# Patient Record
Sex: Female | Born: 1940 | Race: White | Hispanic: No | Smoking: Never smoker
Health system: Southern US, Community
[De-identification: ages and names within clinical notes are randomized; demographics above are authoritative.]

## PROBLEM LIST (undated history)

## (undated) DIAGNOSIS — M1990 Unspecified osteoarthritis, unspecified site: Secondary | ICD-10-CM

## (undated) DIAGNOSIS — I639 Cerebral infarction, unspecified: Secondary | ICD-10-CM

## (undated) DIAGNOSIS — E785 Hyperlipidemia, unspecified: Secondary | ICD-10-CM

## (undated) DIAGNOSIS — I517 Cardiomegaly: Secondary | ICD-10-CM

## (undated) DIAGNOSIS — I10 Essential (primary) hypertension: Secondary | ICD-10-CM

## (undated) HISTORY — PX: REPLACEMENT TOTAL KNEE BILATERAL: SUR1225

## (undated) HISTORY — DX: Cardiomegaly: I51.7

## (undated) HISTORY — DX: Hyperlipidemia, unspecified: E78.5

## (undated) HISTORY — DX: Unspecified osteoarthritis, unspecified site: M19.90

## (undated) HISTORY — DX: Essential (primary) hypertension: I10

## (undated) HISTORY — DX: Cerebral infarction, unspecified: I63.9

---

## 2005-11-11 ENCOUNTER — Ambulatory Visit: Payer: Self-pay

## 2009-06-23 ENCOUNTER — Inpatient Hospital Stay: Payer: Self-pay | Admitting: Internal Medicine

## 2009-07-04 DIAGNOSIS — T783XXA Angioneurotic edema, initial encounter: Secondary | ICD-10-CM | POA: Insufficient documentation

## 2010-01-23 ENCOUNTER — Emergency Department: Payer: Self-pay

## 2010-06-30 ENCOUNTER — Ambulatory Visit: Payer: Self-pay | Admitting: "Endocrinology

## 2010-07-30 DIAGNOSIS — M1710 Unilateral primary osteoarthritis, unspecified knee: Secondary | ICD-10-CM | POA: Insufficient documentation

## 2013-04-14 ENCOUNTER — Emergency Department: Payer: Self-pay | Admitting: Emergency Medicine

## 2013-07-06 LAB — CBC
HCT: 40.9 % (ref 35.0–47.0)
HGB: 13.4 g/dL (ref 12.0–16.0)
MCH: 30.8 pg (ref 26.0–34.0)
MCHC: 32.7 g/dL (ref 32.0–36.0)
MCV: 94 fL (ref 80–100)
Platelet: 347 10*3/uL (ref 150–440)
RBC: 4.33 10*6/uL (ref 3.80–5.20)
RDW: 13.3 % (ref 11.5–14.5)
WBC: 9.7 10*3/uL (ref 3.6–11.0)

## 2013-07-06 LAB — COMPREHENSIVE METABOLIC PANEL
ALBUMIN: 4 g/dL (ref 3.4–5.0)
Alkaline Phosphatase: 116 U/L
Anion Gap: 12 (ref 7–16)
BUN: 21 mg/dL — ABNORMAL HIGH (ref 7–18)
Bilirubin,Total: 0.4 mg/dL (ref 0.2–1.0)
CO2: 26 mmol/L (ref 21–32)
Calcium, Total: 9.7 mg/dL (ref 8.5–10.1)
Chloride: 100 mmol/L (ref 98–107)
Creatinine: 0.7 mg/dL (ref 0.60–1.30)
EGFR (African American): >60
GLUCOSE: 128 mg/dL — AB (ref 65–99)
Osmolality: 280 (ref 275–301)
POTASSIUM: 3.2 mmol/L — AB (ref 3.5–5.1)
SGOT(AST): 26 U/L (ref 15–37)
SGPT (ALT): 29 U/L (ref 12–78)
SODIUM: 138 mmol/L (ref 136–145)
TOTAL PROTEIN: 7.6 g/dL (ref 6.4–8.2)

## 2013-07-06 LAB — TROPONIN I

## 2013-07-06 LAB — LIPASE, BLOOD: Lipase: 201 U/L (ref 73–393)

## 2013-07-06 LAB — ETHANOL
Ethanol %: <0.003 % (ref 0.000–0.080)
Ethanol: <3 mg/dL

## 2013-07-06 LAB — ACETAMINOPHEN LEVEL: Acetaminophen: <2 ug/mL

## 2013-07-06 LAB — SALICYLATE LEVEL: Salicylates, Serum: 2.3 mg/dL

## 2013-07-07 ENCOUNTER — Observation Stay: Payer: Self-pay | Admitting: Internal Medicine

## 2013-07-07 LAB — URINALYSIS, COMPLETE
BACTERIA: NONE SEEN
Bilirubin,UR: NEGATIVE
Blood: NEGATIVE
Glucose,UR: NEGATIVE mg/dL (ref 0–75)
Ketone: NEGATIVE
Nitrite: NEGATIVE
Ph: 6 (ref 4.5–8.0)
Protein: NEGATIVE
RBC,UR: 1 /HPF (ref 0–5)
Specific Gravity: 1.015 (ref 1.003–1.030)
WBC UR: 1 /HPF (ref 0–5)

## 2013-07-07 LAB — POTASSIUM: Potassium: 3.5 mmol/L (ref 3.5–5.1)

## 2014-06-09 NOTE — H&P (Signed)
PATIENT NAME:  Tiffany Barber, SIMMERING MR#:  [id number] DATE OF BIRTH:  Dec 25, 1940  DATE OF ADMISSION:  07/07/2013  REFERRING PHYSICIAN:  Dr. Minna Antis.  PRIMARY CARE PHYSICIAN:  [facility].   CHIEF COMPLAINT:  Accidental overdose.   HISTORY OF PRESENT ILLNESS:  A 74 year old female with history of hypertension, rheumatoid arthritis, presents after an accidental tramadol overdose.  The patient has mild confusion at baseline.  Her daughter helps her with activities of daily living including getting her medications ready at nighttime.  Her daughter was organizing her and her mother's pills, left the room and the patient accidentally took all of her daughter's tramadol (tramadol 50 mg x 40 tablets), called EMS.  On their arrival noted to be initially hypotensive, blood pressure 80s/40s as well as tachycardic.  Currently the patient is without complaints.   REVIEW OF SYSTEMS:  CONSTITUTIONAL:  Denies fevers, chills, fatigue.  EYES:  Denies blurred vision, double vision, eye pain.  EARS, NOSE, THROAT:  Denies tinnitus, ear pain, hearing loss.  RESPIRATORY:  Denies cough, wheeze, shortness of breath.  CARDIOVASCULAR:  Denies chest pain, palpitations, edema.  GASTROINTESTINAL:  Denies nausea, vomiting, diarrhea, abdominal pain.  GENITOURINARY:  Denies dysuria, hematuria.  ENDOCRINE:  Denies nocturia or thyroid problems. HEMATOLOGY AND LYMPHATIC:  Denies easy bruising, bleeding.  SKIN:  Denies rash or lesion.  MUSCULOSKELETAL:  Denies pain in neck, back, shoulder, knees, hips or arthritic symptoms.  NEUROLOGIC:  Denies paralysis, paresthesias.  PSYCHIATRIC:  Denies anxiety or depressive symptoms.  Otherwise, full review of systems performed by me is negative.   PAST MEDICAL HISTORY:  Hypertension as well as rheumatoid arthritis and osteoarthritis.   SOCIAL HISTORY:  Denies any alcohol, tobacco or drug usage.  She uses a cane for ambulation.   FAMILY HISTORY:  Positive for COPD as well as  congestive heart failure.   ALLERGIES:  LISINOPRIL   HOME MEDICATIONS:  Include aspirin 325 mg by mouth daily, clonidine 0.1 mg by mouth twice daily, Norvasc 10 mg by mouth daily, hydrochlorothiazide 10 mg by mouth daily.   PHYSICAL EXAMINATION: VITAL SIGNS:  Temperature 97.4, heart rate 88, respirations 26, currently 20, blood pressure 95/44, currently 137/58, saturating 99% on room air.  Weight 82.1 kg.  GENERAL:  Well-nourished, well-developed, African American female, currently somnolent, but in no acute distress.  HEAD:  Normocephalic, atraumatic.  EYES:  Pupils equal, round, and reactive to light and sluggish.  Extraocular muscles intact.  No scleral icterus.   MOUTH:  Moist mucous membranes.  Dentition intact.  No abscess noted.  EARS, NOSE, THROAT:  Throat clear without exudates.  No external lesions.  NECK:  Supple.  No thyromegaly.  No nodules.  No JVD.  PULMONARY:  Clear to auscultation bilaterally without wheezes, rubs, rhonchi.  No use of accessory muscles.  Good respiratory effort.  CHEST:  Nontender to palpation.  CARDIOVASCULAR:  S1, S2, regular rate and rhythm.  No murmurs, rubs, or gallops, 1+ trace to 1+ edema bilaterally to the shins.  Pedal pulses 2+ bilaterally.  GASTROINTESTINAL:  Soft, nontender, nondistended.  No masses.  Positive bowel sounds.  No hepatosplenomegaly.  MUSCULOSKELETAL:  No swelling, clubbing, or edema, other than described above.  Range of motion full in all extremities.  NEUROLOGIC:  Cranial nerves II through XII intact.  No gross focal neurological deficits.  Sensation intact.  Reflexes intact.  SKIN:  No ulcerations, lesions, rash, or cyanosis.  Skin warm and dry.  Turgor intact.  PSYCHIATRIC:  Mood and affect within  normal limits.  The patient is drowsy, though easily awakens to verbal stimuli and is fully conversant at that time.  Insight and judgment poor.   LABORATORY DATA:  EKG revealing right bundle branch block with QRS measuring 170, which  is essentially unchanged from prior examination.  Sodium 130, potassium 3.2, chloride 100, bicarb 26, BUN 21, creatinine 0.7, glucose 128.  LFTs within normal limits.  Troponin less than 0.02.  Ethanol less than 0.003.  WBC 9.7, hemoglobin 13.4, platelets of 347.  Salicylates 2.3.  Acetaminophen less than 2.   ASSESSMENT AND PLAN:  A 74 year old female with history of hypertension presenting with accidental tramadol overdose. 1.  Tramadol overdose, which was accidental.  The case discussed with Poison Control in the Emergency Department.  She will be placed under observation status on telemetry.  We will also place continuous pulse oximetry.  Continue to provide supportive care.  2.  Hypertension, relatively hypotensive on arrival, however now improved.  I will still hold hydrochlorothiazide and Norvasc.  Restart her other medications and restart these as her blood pressure tolerates.  3.  Hypokalemia.  Replace to goal potassium of 3 to 4. 4.  Venous thromboembolism prophylaxis with heparin subQ. 5.  CODE STATUS:  THE PATIENT IS A FULL CODE.  TIME SPENT:  45 minutes.    ____________________________ Cletis Athensavid K. Hower, MD dkh:ea D: 07/07/2013 00:18:20 ET T: 07/07/2013 00:58:54 ET JOB#: [id number]  cc: Cletis Athensavid K. Hower, MD, <Dictator> DAVID Synetta ShadowK HOWER MD ELECTRONICALLY SIGNED 07/07/2013 3:39

## 2014-06-09 NOTE — Discharge Summary (Signed)
PATIENT NAME:  Tiffany Barber, Tiffany Barber  DATE OF ADMISSION:  07/07/2013 DATE OF DISCHARGE:  07/07/2013  DISCHARGE DIAGNOSES:  1. Accidental tramadol overdose, now doing fine. Half-life of tramadol is about 7-9 hours and she is mentating and symptomatically also within normal limits.  2. Hypertension with relative hypotension on admission, now resolved and blood pressure is high again but under better control. 3. Hypokalemia, repleted and resolved.   SECONDARY DIAGNOSES:  1. Hypertension.  2. Rheumatoid arthritis.  3. Osteoarthritis.   CONSULTATIONS: None.   PROCEDURES AND RADIOLOGY: None.  MAJOR LABORATORY PANEL: UA on admission was negative.   Serum salicylate level 2.3.  Serum Tylenol level less than 2.   HISTORY AND SHORT HOSPITAL COURSE: The patient is a 74 year old female with above-mentioned medical problems who was admitted for tramadol overdose and she was admitted under observation as per poison control recommendation. Please see Dr. Deatra Inaavid Hower's history and physical for further details. The patient was feeling much better the 22nd of May and was discharged home in stable condition.   VITAL SIGNS ON THE DATE OF DISCHARGE:  Are as follows: Temperature 97.1, heart rate 87 per minute, respirations 16 per minute, blood pressure 155/77 mmHg.  She was saturating 94% on room air.  PHYSICAL EXAMINATION: On the date of discharge:  CARDIOVASCULAR:  S1, S2 normal.  No murmurs, rubs, or gallops. LUNGS:  Clear to auscultation bilaterally. No wheezing, rales, rhonchi, or crepitation.  ABDOMEN: Soft, benign.  NEUROLOGIC: Nonfocal examination.   All other physical examination remained at baseline.   DISCHARGE MEDICATIONS:  1. HCTZ 25 mg p.o. daily.  2. Norvasc 10 mg p.o. daily. 3. Clonidine 1 mg p.o. b.i.d.  4. Aspirin 325 mg p.o. at bedtime. She was recommended to hold aspirin over the weekend and start it on Monday, May 25th. She was also instructed  to avoid any kind of nonsteroidal anti-inflammatory medication.   DISCHARGE DIET:  Low sodium.   DISCHARGE ACTIVITY: As tolerated.   DISCHARGE INSTRUCTIONS AND FOLLOWUP: The patient was instructed to follow up with her primary care physician, Tiffany Barber, in 1-2 weeks.   TIME SPENT ON THIS ADMISSION: 40 minutes.    ____________________________ Ellamae SiaVipul S. Sherryll BurgerShah, MD vss:dd D: 07/09/2013 16:31:19 ET T: 07/10/2013 02:38:19 ET JOB#: [id number]  cc: Eddith Mentor S. Sherryll BurgerShah, MD, <Dictator> [address]Tiffany Murfin, PA-C  Ellamae SiaVIPUL S [facility] MD ELECTRONICALLY SIGNED 07/11/2013 11:47

## 2014-07-05 ENCOUNTER — Ambulatory Visit: Payer: Self-pay | Admitting: Ophthalmology

## 2014-08-23 ENCOUNTER — Ambulatory Visit: Payer: Self-pay | Admitting: Ophthalmology

## 2014-10-25 ENCOUNTER — Ambulatory Visit: Payer: Self-pay | Admitting: Ophthalmology

## 2014-11-15 ENCOUNTER — Other Ambulatory Visit: Payer: Self-pay

## 2014-11-15 LAB — CBC AND DIFFERENTIAL
HEMATOCRIT: 38 % (ref 36–46)
Hemoglobin: 12.6 g/dL (ref 12.0–16.0)
NEUTROS ABS: 4 /uL
Platelets: 363 10*3/uL (ref 150–399)
WBC: 6.1 10*3/mL

## 2014-11-15 LAB — LIPID PANEL
Cholesterol: 193 mg/dL (ref 0–200)
HDL: 59 mg/dL (ref 35–70)
LDL CALC: 114 mg/dL
Triglycerides: 99 mg/dL (ref 40–160)

## 2014-11-15 LAB — BASIC METABOLIC PANEL
BUN: 17 mg/dL (ref 4–21)
Creatinine: 0.5 mg/dL (ref 0.5–1.1)
GLUCOSE: 104 mg/dL
Potassium: 4 mmol/L (ref 3.4–5.3)
SODIUM: 139 mmol/L (ref 137–147)

## 2014-11-15 LAB — HEPATIC FUNCTION PANEL
ALT: 22 U/L (ref 7–35)
AST: 22 U/L (ref 13–35)
Alkaline Phosphatase: 101 U/L (ref 25–125)
Bilirubin, Total: 0.2 mg/dL

## 2014-11-21 ENCOUNTER — Ambulatory Visit: Payer: Self-pay | Admitting: Internal Medicine

## 2014-11-21 DIAGNOSIS — M1990 Unspecified osteoarthritis, unspecified site: Secondary | ICD-10-CM | POA: Insufficient documentation

## 2014-11-22 ENCOUNTER — Ambulatory Visit: Payer: Self-pay

## 2014-11-28 ENCOUNTER — Ambulatory Visit: Payer: Self-pay | Admitting: Internal Medicine

## 2014-12-01 DIAGNOSIS — I10 Essential (primary) hypertension: Secondary | ICD-10-CM | POA: Insufficient documentation

## 2015-04-25 DIAGNOSIS — I10 Essential (primary) hypertension: Secondary | ICD-10-CM

## 2015-04-25 DIAGNOSIS — M1712 Unilateral primary osteoarthritis, left knee: Secondary | ICD-10-CM

## 2015-05-30 ENCOUNTER — Ambulatory Visit: Payer: Self-pay | Admitting: Ophthalmology

## 2015-09-02 DIAGNOSIS — R9431 Abnormal electrocardiogram [ECG] [EKG]: Secondary | ICD-10-CM | POA: Insufficient documentation

## 2015-09-02 DIAGNOSIS — I4510 Unspecified right bundle-branch block: Secondary | ICD-10-CM

## 2015-09-02 DIAGNOSIS — R0609 Other forms of dyspnea: Secondary | ICD-10-CM | POA: Insufficient documentation

## 2015-09-02 DIAGNOSIS — Z8673 Personal history of transient ischemic attack (TIA), and cerebral infarction without residual deficits: Secondary | ICD-10-CM | POA: Insufficient documentation

## 2015-11-20 DIAGNOSIS — E785 Hyperlipidemia, unspecified: Secondary | ICD-10-CM | POA: Insufficient documentation

## 2017-02-16 HISTORY — PX: JOINT REPLACEMENT: SHX530

## 2017-09-25 ENCOUNTER — Emergency Department
Admission: EM | Admit: 2017-09-25 | Discharge: 2017-09-25 | Disposition: A | Discharge status: Home / Self Care | Payer: Medicare Other | Attending: Emergency Medicine | Admitting: Emergency Medicine

## 2017-09-25 ENCOUNTER — Emergency Department: Payer: Medicare Other

## 2017-09-25 ENCOUNTER — Other Ambulatory Visit: Payer: Self-pay

## 2017-09-25 DIAGNOSIS — I10 Essential (primary) hypertension: Secondary | ICD-10-CM | POA: Diagnosis not present

## 2017-09-25 DIAGNOSIS — R002 Palpitations: Secondary | ICD-10-CM | POA: Diagnosis not present

## 2017-09-25 DIAGNOSIS — Z79899 Other long term (current) drug therapy: Secondary | ICD-10-CM | POA: Insufficient documentation

## 2017-09-25 LAB — BASIC METABOLIC PANEL
Anion gap: 8 (ref 5–15)
BUN: 18 mg/dL (ref 8–23)
CHLORIDE: 99 mmol/L (ref 98–111)
CO2: 30 mmol/L (ref 22–32)
CREATININE: 0.65 mg/dL (ref 0.44–1.00)
Calcium: 9.3 mg/dL (ref 8.9–10.3)
GFR calc Af Amer: >60 mL/min (ref >60)
GFR calc non Af Amer: >60 mL/min (ref >60)
GLUCOSE: 137 mg/dL — AB (ref 70–99)
POTASSIUM: 3.7 mmol/L (ref 3.5–5.1)
SODIUM: 137 mmol/L (ref 135–145)

## 2017-09-25 LAB — TROPONIN I
Troponin I: <0.03 ng/mL (ref <0.03)
Troponin I: <0.03 ng/mL (ref <0.03)

## 2017-09-25 LAB — CBC
HEMATOCRIT: 36.7 % (ref 35.0–47.0)
Hemoglobin: 12.6 g/dL (ref 12.0–16.0)
MCH: 32.9 pg (ref 26.0–34.0)
MCHC: 34.3 g/dL (ref 32.0–36.0)
MCV: 96 fL (ref 80.0–100.0)
PLATELETS: 363 10*3/uL (ref 150–440)
RBC: 3.82 MIL/uL (ref 3.80–5.20)
RDW: 13.7 % (ref 11.5–14.5)
WBC: 7.8 10*3/uL (ref 3.6–11.0)

## 2017-09-25 NOTE — Discharge Instructions (Addendum)
Your EKG, chest xray, and labs today were okay. Please follow up with your cardiologist for further evaluation of your symptoms.

## 2017-09-25 NOTE — ED Notes (Addendum)
Pts diaper changed and pt repositioned in bed. Pt requested food. Pt offered what food/drink we have in ED. Pt denied wanting anything offered.

## 2017-09-25 NOTE — ED Triage Notes (Signed)
Pt arrives via ems from home, states that this am when she sat up on the side of the bed she felt her heart racing and it made her feel dizzy. Wide complexes noted to EMS' monitor strip. fsbs of 148 for ems as well as vitals of 148/68, 100 pulse rate, 97% RA. Pt denies sob or pain at this time

## 2017-09-25 NOTE — ED Provider Notes (Signed)
[facility] Emergency Department Provider Note  ____________________________________________  Time seen: Approximately 3:07 PM  I have reviewed the triage vital signs and the nursing notes.   HISTORY  Chief Complaint Irregular Heart Beat    HPI Tiffany Barber is a 77 y.o. female with a history of hypertension who complains of a brief moment of palpitations that occurred while she was turning over in bed this morning at about 9:30 AM.  Lasted just a few seconds, denies chest pain or radiation.  No aggravating or alleviating factors.  Resolved spontaneously without recurrence over the past few hours.  She previously had a nuclear medicine cardiac study in 2017 for cardiac clearance in advance of planned bilateral knee replacement.  She had one year placement at that time.  She is planned for the other need to be replaced by orthopedics over the next few months.  She reports that she went to another cardiologist through [facility] and had repeat cardiac screening that was unremarkable.  She does not have that result available.  Is not available in the EMR.      Past Medical History:  Diagnosis Date  . 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 Hypertension      Patient Active Problem List   Diagnosis Date Noted  . Hypertension 12/01/2014  . Degenerative arthritis 11/21/2014        Prior to Admission medications   Medication Sig Start Date End Date Taking? Authorizing Provider  amLODipine (NORVASC) 10 MG tablet Take 10 mg by mouth daily.   Yes [provider]  aspirin 325 MG tablet Take 325 mg by mouth daily.   Yes [provider]  cloNIDine (CATAPRES) 0.1 MG tablet Take 0.1 mg by mouth 2 (two) times daily. 05/23/14  Yes [provider]  fluticasone (FLONASE) 50 MCG/ACT nasal spray Place 2 sprays into both nostrils daily. 06/29/17  Yes [provider]  hydrochlorothiazide (HYDRODIURIL) 25 MG tablet Take 25  mg by mouth daily. 05/23/14  Yes [provider]  lovastatin (MEVACOR) 20 MG tablet Take 1 tablet by mouth daily. 06/29/17  Yes [provider]  meloxicam (MOBIC) 15 MG tablet Take 15 mg by mouth daily. 05/23/14  Yes [provider]  traMADol (ULTRAM) 50 MG tablet Take 1 tablet by mouth at bedtime.  07/14/17  Yes [provider]  TRAVATAN Z 0.004 % SOLN ophthalmic solution Place 1 drop into both ears daily. 06/29/17  Yes [provider]     Allergies Lisinopril and Nifedipine   Family History  Problem Relation Age of Onset  . Hypertension Mother   . Diabetes Daughter   . Hyperlipidemia Daughter     Social History Social History   Tobacco Use  . Smoking status: Never Smoker  Substance Use Topics  . Alcohol use: No  . Drug use: No    Review of Systems  Constitutional:   No fever or chills.  Cardiovascular:   No chest pain or syncope.  Positive palpitations Respiratory:   No dyspnea or cough. Gastrointestinal:   Negative for abdominal pain, vomiting and diarrhea.  Musculoskeletal:   Negative for focal pain or swelling All other systems reviewed and are negative except as documented above in ROS and HPI.  ____________________________________________   PHYSICAL EXAM:  VITAL SIGNS: ED Triage Vitals [09/25/17 1108]  Enc Vitals Group     BP 132/69     Pulse Rate 92     Resp 18  Temp 97.8 F (36.6 C)     Temp Source Oral     SpO2 97 %     Weight 195 lb (88.5 kg)     Height 5' (1.524 m)     Head Circumference      Peak Flow      Pain Score 0     Pain Loc      Pain Edu?      Excl. in GC?     Vital signs reviewed, nursing assessments reviewed.   Constitutional:   Alert and oriented. Non-toxic appearance. Eyes:   Conjunctivae are normal. EOMI. PERRL. ENT      Head:   Normocephalic and atraumatic.      Nose:   No congestion/rhinnorhea.       Mouth/Throat:   MMM, no pharyngeal erythema. No peritonsillar mass.        Neck:   No meningismus. Full ROM. Hematological/Lymphatic/Immunilogical:   No cervical lymphadenopathy. Cardiovascular:   RRR. Symmetric bilateral radial and DP pulses.  No murmurs. Cap refill less than 2 seconds. Respiratory:   Normal respiratory effort without tachypnea/retractions. Breath sounds are clear and equal bilaterally. No wheezes/rales/rhonchi. Gastrointestinal:   Soft and nontender. Non distended. There is no CVA tenderness.  No rebound, rigidity, or guarding. Musculoskeletal:   Normal range of motion in all extremities. No joint effusions.  No lower extremity tenderness.  No edema. Neurologic:   Normal speech and language.  Motor grossly intact. No acute focal neurologic deficits are appreciated.  Skin:    Skin is warm, dry and intact. No rash noted.  No petechiae, purpura, or bullae.  ____________________________________________    LABS (pertinent positives/negatives) (all labs ordered are listed, but only abnormal results are displayed) Labs Reviewed  BASIC METABOLIC PANEL - Abnormal; Notable for the following components:      Result Value   Glucose, Bld 137 (*)    All other components within normal limits  CBC  TROPONIN I  TROPONIN I   ____________________________________________   EKG  Interpreted by me Sinus rhythm rate of 91, left axis, right bundle branch block.  LVH.  No acute ischemic changes.  ____________________________________________    RADIOLOGY  Dg Chest 2 View  Result Date: 09/25/2017 CLINICAL DATA:  Dizziness and tachycardia today. EXAM: CHEST - 2 VIEW COMPARISON:  06/23/2009. FINDINGS: The cardiac silhouette remains borderline enlarged. 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 The interstitial markings remain mildly prominent. Diffuse osteopenia. Thoracic spine degenerative changes. IMPRESSION: Stable mild chronic interstitial lung disease and borderline cardiomegaly. No acute abnormality. Electronically Signed   By: Beckie SaltsSteven  Reid  M.D.   On: 09/25/2017 11:44    ____________________________________________   PROCEDURES Procedures  ____________________________________________    CLINICAL IMPRESSION / ASSESSMENT AND PLAN / ED COURSE  Pertinent labs & imaging results that were available during my care of the patient were reviewed by me and considered in my medical decision making (see chart for details).    Patient not in distress, presents with normal vital signs after an episode of palpitations.  Denies chest pain shortness of breath or exertional symptoms.  EKG is unchanged from previous.  Chest x-ray is unremarkable.  Labs are normal including troponin x2.  Symptoms are not concerning for any serious underlying cardiac or pulmonary pathology, refer back to cardiology for further assessment and preop planning.      ____________________________________________   FINAL CLINICAL IMPRESSION(S) / ED DIAGNOSES    Final diagnoses:  Palpitations     ED Discharge Orders  None      Portions of this note were generated with dragon dictation software. Dictation errors may occur despite best attempts at proofreading.    Sharman Cheek, MD 09/25/17 [phone]

## 2017-09-28 ENCOUNTER — Encounter: Payer: Self-pay | Admitting: Emergency Medicine

## 2017-09-28 ENCOUNTER — Emergency Department: Payer: Medicare Other

## 2017-09-28 ENCOUNTER — Emergency Department
Admission: EM | Admit: 2017-09-28 | Discharge: 2017-09-28 | Disposition: A | Discharge status: Home / Self Care | Payer: Medicare Other | Attending: Emergency Medicine | Admitting: Emergency Medicine

## 2017-09-28 ENCOUNTER — Other Ambulatory Visit: Payer: Self-pay

## 2017-09-28 DIAGNOSIS — I10 Essential (primary) hypertension: Secondary | ICD-10-CM | POA: Diagnosis not present

## 2017-09-28 DIAGNOSIS — Z7982 Long term (current) use of aspirin: Secondary | ICD-10-CM | POA: Insufficient documentation

## 2017-09-28 DIAGNOSIS — R42 Dizziness and giddiness: Secondary | ICD-10-CM

## 2017-09-28 DIAGNOSIS — Z79899 Other long term (current) drug therapy: Secondary | ICD-10-CM | POA: Insufficient documentation

## 2017-09-28 LAB — URINALYSIS, COMPLETE (UACMP) WITH MICROSCOPIC
Bacteria, UA: NONE SEEN
Bilirubin Urine: NEGATIVE
GLUCOSE, UA: NEGATIVE mg/dL
Hgb urine dipstick: NEGATIVE
KETONES UR: NEGATIVE mg/dL
Nitrite: NEGATIVE
PH: 7 (ref 5.0–8.0)
Protein, ur: NEGATIVE mg/dL
SPECIFIC GRAVITY, URINE: 1.01 (ref 1.005–1.030)

## 2017-09-28 LAB — BASIC METABOLIC PANEL
Anion gap: 12 (ref 5–15)
BUN: 19 mg/dL (ref 8–23)
CHLORIDE: 98 mmol/L (ref 98–111)
CO2: 29 mmol/L (ref 22–32)
CREATININE: 0.57 mg/dL (ref 0.44–1.00)
Calcium: 9.2 mg/dL (ref 8.9–10.3)
GFR calc Af Amer: >60 mL/min (ref >60)
GLUCOSE: 128 mg/dL — AB (ref 70–99)
Potassium: 3.6 mmol/L (ref 3.5–5.1)
SODIUM: 139 mmol/L (ref 135–145)

## 2017-09-28 LAB — CBC
HCT: 38.4 % (ref 35.0–47.0)
Hemoglobin: 13.5 g/dL (ref 12.0–16.0)
MCH: 34.1 pg — ABNORMAL HIGH (ref 26.0–34.0)
MCHC: 35.2 g/dL (ref 32.0–36.0)
MCV: 97.1 fL (ref 80.0–100.0)
PLATELETS: 387 10*3/uL (ref 150–440)
RBC: 3.96 MIL/uL (ref 3.80–5.20)
RDW: 13.6 % (ref 11.5–14.5)
WBC: 10.3 10*3/uL (ref 3.6–11.0)

## 2017-09-28 LAB — TROPONIN I: Troponin I: <0.03 ng/mL (ref <0.03)

## 2017-09-28 MED ORDER — MECLIZINE HCL 25 MG PO TABS
25.0000 mg | ORAL_TABLET | Freq: Once | ORAL | Status: AC
  Administered 2017-09-28: 25 mg via ORAL
  Filled 2017-09-28: qty 1

## 2017-09-28 MED ORDER — SODIUM CHLORIDE 0.9 % IV BOLUS
500.0000 mL | Freq: Once | INTRAVENOUS | Status: AC
  Administered 2017-09-28: 500 mL via INTRAVENOUS

## 2017-09-28 MED ORDER — MECLIZINE HCL 25 MG PO TABS: 25.0000 mg | ORAL_TABLET | Freq: Three times a day (TID) | ORAL | 0 refills | Status: DC | PRN

## 2017-09-28 NOTE — ED Provider Notes (Signed)
[facility] Emergency Department Provider Note   ____________________________________________   First MD Initiated Contact with Patient 09/28/17 0407     (approximate)  I have reviewed the triage vital signs and the nursing notes.   HISTORY  Chief Complaint Dizziness    HPI Tiffany Barber is a 77 y.o. female who comes into the hospital today with some dizziness.  The patient was woken up from sleep feeling dizzy.  The patient has no chest pain or shortness of breath.  She was here recently with similar symptoms.  The patient went to bed earlier today and then woke up at 230 with the symptoms.  She felt like everything was spinning around.  The patient denies any nausea or vomiting.  She states that she is had a stroke in the past and is on blood thinners.  Her head just feels funny.  She denies any headache or blurred vision.  The patient states that she was here for vertigo recently but they did not give her any medicines to go home with.  She is here for evaluation.  Past Medical History:  Diagnosis Date  . 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 Hypertension     Patient Active Problem List   Diagnosis Date Noted  . Hypertension 12/01/2014  . Degenerative arthritis 11/21/2014    History reviewed. No pertinent surgical history.  Prior to Admission medications   Medication Sig Start Date End Date Taking? Authorizing Provider  amLODipine (NORVASC) 10 MG tablet Take 10 mg by mouth daily.    [provider]  aspirin 325 MG tablet Take 325 mg by mouth daily.    [provider]  cloNIDine (CATAPRES) 0.1 MG tablet Take 0.1 mg by mouth 2 (two) times daily. 05/23/14   [provider]  fluticasone (FLONASE) 50 MCG/ACT nasal spray Place 2 sprays into both nostrils daily. 06/29/17   [provider]  hydrochlorothiazide (HYDRODIURIL) 25 MG tablet Take 25 mg by mouth daily. 05/23/14   [provider]    lovastatin (MEVACOR) 20 MG tablet Take 1 tablet by mouth daily. 06/29/17   [provider]  meclizine (ANTIVERT) 25 MG tablet Take 1 tablet (25 mg total) by mouth 3 (three) times daily as needed for dizziness. 09/28/17   Rebecka ApleyWebster, Allison P, MD  meloxicam (MOBIC) 15 MG tablet Take 15 mg by mouth daily. 05/23/14   [provider]  traMADol (ULTRAM) 50 MG tablet Take 1 tablet by mouth at bedtime.  07/14/17   [provider]  TRAVATAN Z 0.004 % SOLN ophthalmic solution Place 1 drop into both ears daily. 06/29/17   [provider]    Allergies Lisinopril and Nifedipine  Family History  Problem Relation Age of Onset  . Hypertension Mother   . Diabetes Daughter   . Hyperlipidemia Daughter     Social History Social History   Tobacco Use  . Smoking status: Never Smoker  . Smokeless tobacco: Never Used  Substance Use Topics  . Alcohol use: No  . Drug use: No    Review of Systems  Constitutional: No fever/chills Eyes: No visual changes. ENT: No sore throat. Cardiovascular: Denies chest pain. Respiratory: Denies shortness of breath. Gastrointestinal: No abdominal pain.  No nausea, no vomiting.  No diarrhea.  No constipation. Genitourinary: Negative for dysuria. Musculoskeletal: Negative for back pain. Skin: Negative for rash. Neurological: Dizziness   ____________________________________________   PHYSICAL EXAM:  VITAL SIGNS: ED Triage Vitals  Enc  Vitals Group     BP 09/28/17 0407 (!) 143/71     Pulse Rate 09/28/17 0407 88     Resp 09/28/17 0407 18     Temp 09/28/17 0402 98.3 F (36.8 C)     Temp Source 09/28/17 0402 Oral     SpO2 09/28/17 0407 97 %     Weight 09/28/17 0402 195 lb (88.5 kg)     Height 09/28/17 0402 5' (1.524 m)     Head Circumference --      Peak Flow --      Pain Score 09/28/17 0402 0     Pain Loc --      Pain Edu? --      Excl. in GC? --     Constitutional: Alert and oriented. Well appearing and in moderate  distress. Eyes: Conjunctivae are normal. PERRL. EOMI. Head: Atraumatic. Nose: No congestion/rhinnorhea. Mouth/Throat: Mucous membranes are moist.  Oropharynx non-erythematous. Cardiovascular: Normal rate, regular rhythm. Grossly normal heart sounds.  Good peripheral circulation. Respiratory: Normal respiratory effort.  No retractions. Lungs CTAB. Gastrointestinal: Soft and nontender. No distention.  Positive bowel sounds Musculoskeletal: No lower extremity tenderness nor edema.   Neurologic:  Normal speech and language.  Cranial nerves II through XII are grossly intact with no focal motor neuro deficits Skin:  Skin is warm, dry and intact.  Psychiatric: Mood and affect are normal.   ____________________________________________   LABS (all labs ordered are listed, but only abnormal results are displayed)  Labs Reviewed  BASIC METABOLIC PANEL - Abnormal; Notable for the following components:      Result Value   Glucose, Bld 128 (*)    All other components within normal limits  CBC - Abnormal; Notable for the following components:   MCH 34.1 (*)    All other components within normal limits  URINALYSIS, COMPLETE (UACMP) WITH MICROSCOPIC - Abnormal; Notable for the following components:   Color, Urine YELLOW (*)    APPearance CLEAR (*)    Leukocytes, UA SMALL (*)    All other components within normal limits  TROPONIN I  TROPONIN I  CBG MONITORING, ED   ____________________________________________  EKG  ED ECG REPORT I, Rebecka ApleyWebster,  Allison P, the attending physician, personally viewed and interpreted this ECG.   Date: 09/28/2017  EKG Time: 403  Rate: 87  Rhythm: normal sinus rhythm  Axis: left axis deviation  Intervals:right bundle branch block  ST&T Change: ST segment elevation in lead V3 and lead V4  ____________________________________________  RADIOLOGY  ED MD interpretation:  CT head: No acute intracranial abnormalities, chronic atrophy and small vessel ischemic  changes.  Official radiology report(s): Ct Head Wo Contrast  Result Date: 09/28/2017 CLINICAL DATA:  Awoke with dizziness , hypertension, abnormal EKG rhythm EXAM: CT HEAD WITHOUT CONTRAST TECHNIQUE: Contiguous axial images were obtained from the base of the skull through the vertex without intravenous contrast. COMPARISON:  MRI brain 06/26/2009. CT head 06/23/2009 FINDINGS: Brain: Mild cerebral atrophy. Low-attenuation changes in the deep white matter consistent with small vessel ischemia and old lacunar infarcts. No change since previous study. No mass-effect or midline shift. No abnormal extra-axial fluid collections. Gray-white matter junctions are distinct. Basal cisterns are not effaced. No acute intracranial hemorrhage. Vascular: Intracranial arterial vascular calcifications are present. Skull: Normal. Negative for fracture or focal lesion. Sinuses/Orbits: No acute finding. Other: None. IMPRESSION: No acute intracranial abnormalities. Chronic atrophy and small vessel ischemic changes. Electronically Signed   By: Burman NievesWilliam  Stevens M.D.   On: 09/28/2017 05:18  ____________________________________________   PROCEDURES  Procedure(s) performed: None  Procedures  Critical Care performed: No  ____________________________________________   INITIAL IMPRESSION / ASSESSMENT AND PLAN / ED COURSE  As part of my medical decision making, I reviewed the following data within the electronic MEDICAL RECORD NUMBER Notes from prior ED visits and [place] Controlled Substance Database   This is a 77 year old female who comes into the hospital today with dizziness.  She reports that she woke up and she felt as though the room was spinning.  She did receive an EKG which showed a right bundle branch block and some one box elevation in V3 and V4 which had not been seen previously.  We did check a CBC, BMP and a troponin which were all unremarkable.  The patient also had a CT scan of her head which was unremarkable.   I did give the patient some meclizine and a 500 mm bolus of normal saline.  I will await the urinalysis and we will repeat her troponin and I will reassess the patient. The patient also complained that her ear felt funny but her ears had no abnormality when evaluated.   After the meclizine the patient states that she feels much improved.  We will repeat the patient's troponin and she will be disposition.  The patient's care will be signed out to Dr. Scotty Court who will reassess the patient and determine her disposition.      ____________________________________________   FINAL CLINICAL IMPRESSION(S) / ED DIAGNOSES  Final diagnoses:  Dizziness     ED Discharge Orders         Ordered    meclizine (ANTIVERT) 25 MG tablet  3 times daily PRN     09/28/17 0749           Note:  This document was prepared using Dragon voice recognition software and may include unintentional dictation errors.    Rebecka Apley, MD 09/28/17 [phone]

## 2017-09-28 NOTE — Discharge Instructions (Addendum)
Please follow up with your cardiologist for your continued symptoms.  Your blood tests and EKG were okay and don't show any new issues, but it may be necessary to have you wear a heart monitor/recorder to further assess your symptoms. Your cardiologist can help decide this.

## 2017-09-28 NOTE — ED Notes (Signed)
[facility] called for transport  To [address]  [phone]

## 2017-09-28 NOTE — ED Notes (Signed)
Pt states she is no longer having dizziness at this time but is concerned about her heart because ems told pt something was wrong with her heart rhythm.

## 2017-09-28 NOTE — ED Triage Notes (Signed)
Awoke with dizziness , hypertension, abnormal EKG rhythm  Transmitted EKG viewed by Dr .Marisa SeverinSiadecki

## 2017-10-12 DIAGNOSIS — R42 Dizziness and giddiness: Secondary | ICD-10-CM | POA: Insufficient documentation

## 2018-02-01 ENCOUNTER — Ambulatory Visit: Payer: Medicare Other | Attending: Orthopedic Surgery | Admitting: Physical Therapy

## 2018-02-01 DIAGNOSIS — M6281 Muscle weakness (generalized): Secondary | ICD-10-CM | POA: Diagnosis present

## 2018-02-01 DIAGNOSIS — M25662 Stiffness of left knee, not elsewhere classified: Secondary | ICD-10-CM

## 2018-02-01 DIAGNOSIS — Z96652 Presence of left artificial knee joint: Secondary | ICD-10-CM

## 2018-02-01 DIAGNOSIS — G8929 Other chronic pain: Secondary | ICD-10-CM

## 2018-02-01 DIAGNOSIS — R269 Unspecified abnormalities of gait and mobility: Secondary | ICD-10-CM

## 2018-02-01 DIAGNOSIS — M25562 Pain in left knee: Secondary | ICD-10-CM | POA: Insufficient documentation

## 2018-02-02 ENCOUNTER — Encounter: Payer: Self-pay | Admitting: Physical Therapy

## 2018-02-02 NOTE — Therapy (Signed)
[facility] [address] [place], [place], [address] Phone: [phone]   Fax:  [phone]  Physical Therapy Evaluation  Patient Details  Name: Tiffany Barber MRN: [id number] Date of Birth: 03-08-1940 Referring Provider (PT): Dr. Lanell Matar   Encounter Date: 02/01/2018  PT End of Session - 02/02/18 1930    Visit Number  1    Number of Visits  8    Date for PT Re-Evaluation  03/01/18    PT Start Time  1331    PT Stop Time  1432    PT Time Calculation (min)  61 min    Equipment Utilized During Treatment  Gait belt    Activity Tolerance  Patient tolerated treatment well;Patient limited by fatigue       Past Medical History:  Diagnosis Date  . 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 Hypertension     History reviewed. No pertinent surgical history.  There were no vitals filed for this visit.   Subjective Assessment - 02/02/18 1911    Subjective  Pt. referred to PT after completing HHPT with Louis Meckel.  Pt. s/p L TKA on 11/23/17.      Patient is accompained by:  Family member    Pertinent History  Pt. lives with son and daughter in [place].  Pt. s/p R TKA in 2017.  Pt. has primarily used a w/c over past several years due to L knee pain.  Pt. had several falls in 2017 resulting in significant B shoulder joint arthritis/ stiffness.  Pt. requires use of transport to go to MD visits secondary to w/c use.  Pt. does limited activities outside and has a CNA assist with bathing (in bed) and household tasks 6 hours/ day.  Pt. states she does exercises with her aide on a regular basis.  Pt. likes going to church, cooking and reading books.      How long can you stand comfortably?  <2 min.     Patient Stated Goals  Increase B LE muscle strength to improve standing/ walking.      Currently in Pain?  No/denies         [facility] PT Assessment - 02/02/18 0001      Assessment   Medical Diagnosis  s/p L total knee arthroplasty.     Referring Provider (PT)  Dr. Lanell Matar    Onset Date/Surgical Date  11/23/17    Prior Therapy  HHPT with Trey Paula      Precautions   Precautions  Fall      Restrictions   Weight Bearing Restrictions  No      Balance Screen   Has the patient fallen in the past 6 months  No      Prior Function   Level of Independence  Requires assistive device for independence    Vocation  Retired      IT consultant   Overall Cognitive Status  Within Functional Limits for tasks assessed         Manual tx.:  Supine L knee AA/PROM 5x each.   L patellar mobs (all planes).  Discussed scar massage.    There.ex.: Nustep L4 5 min. Discussed HEP  Gait training: amb. In PT clinic with use of RW (mod. A) 12 feet/ 12 feet x 2/ 14 feet.   Cuing to increase hip flexion/ step pattern/ heel strike.  Limited by shoulders.    PT Education - 02/02/18 1928  Education Details  Reviewed HEP/ proper technique with safe sit to stands    Person(s) Educated  Patient    Methods  Explanation;Demonstration    Comprehension  Verbalized understanding;Returned demonstration          PT Long Term Goals - 02/02/18 1952      PT LONG TERM GOAL #1   Title  Pt. will increase FOTO to 52 to improve functional mobility.      Baseline  Initial FOTO: 41    Time  4    Period  Weeks    Status  New    Target Date  03/01/18      PT LONG TERM GOAL #2   Title  Pt. will increase L knee AROM (0 to >100 deg.) to improve functional mobility.     Baseline  R knee AROM: 0 to 113 deg. L knee AROM: -4 to 88 deg. and PROM: 107 deg.    Time  4    Period  Weeks    Status  New    Target Date  03/01/18      PT LONG TERM GOAL #3   Title  Pt. able to safely transfer from chair to RW with mod. I and proper technique to improve mobility.      Baseline  Mod. A x 1 to safely stand from chair.  PT has to stabilize RW and assist pt.     Time  4    Period  Weeks    Status  New    Target Date  03/01/18      PT LONG TERM GOAL #4   Title   Pt. will ambulate with 100 feet with CGA and use of RW with consistent step pattern safely.      Baseline  Pt. ambulates with limited B hip/knee flexion and no heel strike. Pt. able to ambulate 12 feet before requesting seated rest break.    Time  4    Period  Weeks    Status  New    Target Date  03/01/18         Plan - 02/02/18 1932    Clinical Impression Statement  Pt. is a pleasant 77 y/o female s/p L TKA on 11/23/17.  Pt. reports no pain at rest and currently using w/c.  Pt. has RW at home but limited by B shoulder stiffness/ weakness.  Pt. wearing B neoprene knee braces and presents with good scar healing/ minimal inflammation.  R knee AROM: 0 to 113 deg.  L knee AROM: -4 to 88 deg. and PROM: 107 deg. B LE strength grossly 4/5 MMT except R hip flex. 3/5 MMT, L hip flex. 2/5 MMT.  Min. to mod. assist x 1 to stand from chair with RW.  Min. to mod. assist with bed mobility due to limited UE assist.  Pt. ambulates with limited B hip/knee flexion and no heel strike.  Pt. able to ambulate 12 feet before requesting seated rest break.  FOTO: initial 41/ goal 52.  Pt. will benefit from skilled PT services to increase L knee AROM/ B LE strength to improve pain-free functional mobility.       Clinical Presentation  Evolving    Clinical Decision Making  Moderate    Rehab Potential  Fair    Clinical Impairments Affecting Rehab Potential  B shoulder joint stiffness    PT Frequency  2x / week    PT Duration  4 weeks    PT Treatment/Interventions  ADLs/Self  Care Home Management;Cryotherapy;Moist Heat;Gait training;Stair training;Functional mobility training;Neuromuscular re-education;Balance training;Therapeutic exercise;Therapeutic activities;Patient/family education;Manual techniques;Wheelchair mobility training;Scar mobilization;Passive range of motion    PT Next Visit Plan  B LE muscle strengthening/ gait training    PT Home Exercise Plan  see handouts.        Patient will benefit from skilled  therapeutic intervention in order to improve the following deficits and impairments:  Abnormal gait, Improper body mechanics, Pain, Decreased mobility, Decreased scar mobility, Decreased activity tolerance, Decreased endurance, Decreased range of motion, Decreased strength, Hypomobility, Decreased balance, Decreased safety awareness, Difficulty walking, Impaired flexibility  Visit Diagnosis: Status post left knee replacement  Joint stiffness of knee, left  Muscle weakness (generalized)  Gait difficulty  Chronic pain of left knee     Problem List Patient Active Problem List   Diagnosis Date Noted  . Hypertension 12/01/2014  . Degenerative arthritis 11/21/2014   Cammie McgeeMichael C Suhaas Agena, PT, DPT # [id number] r12/18/2019, 7:59 PM  [place] [facility] [address] [place], [place], [address] Phone: [phone]   Fax:  [phone]  Name: [name] MRN: [id number] Date of Birth: 06/27/1940

## 2018-02-03 ENCOUNTER — Ambulatory Visit: Payer: Medicare Other | Admitting: Physical Therapy

## 2018-02-03 ENCOUNTER — Encounter: Payer: Self-pay | Admitting: Physical Therapy

## 2018-02-03 DIAGNOSIS — M25662 Stiffness of left knee, not elsewhere classified: Secondary | ICD-10-CM

## 2018-02-03 DIAGNOSIS — R269 Unspecified abnormalities of gait and mobility: Secondary | ICD-10-CM

## 2018-02-03 DIAGNOSIS — Z96652 Presence of left artificial knee joint: Secondary | ICD-10-CM | POA: Diagnosis not present

## 2018-02-03 DIAGNOSIS — M6281 Muscle weakness (generalized): Secondary | ICD-10-CM

## 2018-02-03 DIAGNOSIS — M25562 Pain in left knee: Secondary | ICD-10-CM

## 2018-02-03 DIAGNOSIS — G8929 Other chronic pain: Secondary | ICD-10-CM

## 2018-02-03 NOTE — Therapy (Signed)
[place] [facility] [address] [place], [place], [address] Phone: [phone]   Fax:  [phone]  Physical Therapy Treatment  Patient Details  Name: Tiffany Barber MRN: [id number] Date of Birth: 03/21/1940 Referring Provider (PT): Dr. Lanell Matarel Gaizo   Encounter Date: 02/03/2018  PT End of Session - 02/03/18 1254    Visit Number  2    Number of Visits  8    Date for PT Re-Evaluation  03/01/18    PT Start Time  1245    PT Stop Time  1330    PT Time Calculation (min)  45 min    Equipment Utilized During Treatment  Gait belt    Activity Tolerance  Patient tolerated treatment well;Patient limited by fatigue;Patient limited by pain    Behavior During Therapy  [facility] for tasks assessed/performed       Past Medical History:  Diagnosis Date  . 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 Hypertension     History reviewed. No pertinent surgical history.  There were no vitals filed for this visit.  Subjective Assessment - 02/03/18 1242    Subjective  Patient states that she is doing fine since her last visit. No falls reported.  She reports doing seated knee bends and LAQ at home.     Patient is accompained by:  Family member    Pertinent History  Pt. lives [place] son and daughter [place].  Pt. s/p R TKA in 2017.  Pt. has primarily used a w/c over past several years due to L knee pain.  Pt. had several falls in 2017 resulting in significant B shoulder joint arthritis/ stiffness.  Pt. requires use of transport to go to MD visits secondary to w/c use.  Pt. does limited activities outside and has a CNA assist with bathing (in bed) and household tasks 6 hours/ day.  Pt. states she does exercises with her aide on a regular basis.  Pt. likes going to church, cooking and reading books.      How long can you stand comfortably?  <2 min.     Patient Stated Goals  Increase B LE muscle strength to improve standing/ walking.      Currently in Pain?  Yes     Pain Score  7     Pain Location  Knee    Pain Orientation  Left    Pain Descriptors / Indicators  Sore    Pain Type  Surgical pain    Pain Onset  More than a month ago       TREATMENT  Therapeutic Exercise: Nu Step L4, 15 min, Mod A for foot position and VCs for speed and pushing through whole foot. (Hx taken; 10 minutes unbilled) LAQ 1# x20 Seated hamstring curls #1 x20 Ambulation, RW, Mod A, x12 feet x2 // bars: forward walking/ backward walking x2 laps, BUE support, CGA  Patient requires frequent seated rest breaks 2/2 to fatigue and strength deficits.    PT Education - 02/03/18 1253    Education Details  exercise technique, transfer education    Person(s) Educated  Patient    Methods  Explanation;Demonstration;Verbal cues    Comprehension  Verbalized understanding;Returned demonstration;Need further instruction          PT Long Term Goals - 02/02/18 1952      PT LONG TERM GOAL #1   Title  Pt. will increase FOTO to 52 to improve functional mobility.  Baseline  Initial FOTO: 41    Time  4    Period  Weeks    Status  New    Target Date  03/01/18      PT LONG TERM GOAL #2   Title  Pt. will increase L knee AROM (0 to >100 deg.) to improve functional mobility.     Baseline  R knee AROM: 0 to 113 deg. L knee AROM: -4 to 88 deg. and PROM: 107 deg.    Time  4    Period  Weeks    Status  New    Target Date  03/01/18      PT LONG TERM GOAL #3   Title  Pt. able to safely transfer from chair to RW with mod. I and proper technique to improve mobility.      Baseline  Mod. A x 1 to safely stand from chair.  PT has to stabilize RW and assist pt.     Time  4    Period  Weeks    Status  New    Target Date  03/01/18      PT LONG TERM GOAL #4   Title  Pt. will ambulate with 100 feet with CGA and use of RW with consistent step pattern safely.      Baseline  Pt. ambulates with limited B hip/knee flexion and no heel strike. Pt. able to ambulate 12 feet before requesting  seated rest break.    Time  4    Period  Weeks    Status  New    Target Date  03/01/18            Plan - 02/03/18 1255    Clinical Impression Statement  Patient presents to clinic with increased pain 7/10, but is amenable to therapy. Patient demonstrates decreased safety awareness for transfers from w/c with a RW relying on a pulling up strategy, but is limited by her UE mobility andn is unable to push-up from the w/c. Patient continues to require mod A for gait with a RW, but was able to achieve more knee flexion bilaterally with moderate VCs. Patient will continue to benefit from skilled therapeutic intervention to address deficits in strength, ambulation, balance, and function in order to improve independence and overall QOL.    Rehab Potential  Fair    Clinical Impairments Affecting Rehab Potential  B shoulder joint stiffness    PT Frequency  2x / week    PT Duration  4 weeks    PT Treatment/Interventions  ADLs/Self Care Home Management;Cryotherapy;Moist Heat;Gait training;Stair training;Functional mobility training;Neuromuscular re-education;Balance training;Therapeutic exercise;Therapeutic activities;Patient/family education;Manual techniques;Wheelchair mobility training;Scar mobilization;Passive range of motion    PT Next Visit Plan  B LE muscle strengthening/ gait training    PT Home Exercise Plan  see handouts.        Patient will benefit from skilled therapeutic intervention in order to improve the following deficits and impairments:  Abnormal gait, Improper body mechanics, Pain, Decreased mobility, Decreased scar mobility, Decreased activity tolerance, Decreased endurance, Decreased range of motion, Decreased strength, Hypomobility, Decreased balance, Decreased safety awareness, Difficulty walking, Impaired flexibility  Visit Diagnosis: Status post left knee replacement  Joint stiffness of knee, left  Muscle weakness (generalized)  Gait difficulty  Chronic pain of left  knee     Problem List Patient Active Problem List   Diagnosis Date Noted  . Hypertension 12/01/2014  . Degenerative arthritis 11/21/2014   Sheria LangKatlin Kallen Delatorre PT, DPT [phone] 02/03/2018, 5:11 PM  [facility] [address] [place], [place], [address] Phone: [phone]   Fax:  [phone]  Name: Tiffany Barber MRN: [id number] Date of Birth: 07-17-1940

## 2018-02-07 ENCOUNTER — Encounter: Payer: Self-pay | Admitting: Physical Therapy

## 2018-02-07 ENCOUNTER — Ambulatory Visit: Payer: Medicare Other | Admitting: Physical Therapy

## 2018-02-07 DIAGNOSIS — R269 Unspecified abnormalities of gait and mobility: Secondary | ICD-10-CM

## 2018-02-07 DIAGNOSIS — M25562 Pain in left knee: Secondary | ICD-10-CM

## 2018-02-07 DIAGNOSIS — Z96652 Presence of left artificial knee joint: Secondary | ICD-10-CM | POA: Diagnosis not present

## 2018-02-07 DIAGNOSIS — M6281 Muscle weakness (generalized): Secondary | ICD-10-CM

## 2018-02-07 DIAGNOSIS — M25662 Stiffness of left knee, not elsewhere classified: Secondary | ICD-10-CM

## 2018-02-07 DIAGNOSIS — G8929 Other chronic pain: Secondary | ICD-10-CM

## 2018-02-09 NOTE — Therapy (Signed)
[place] [facility] [address] [place], [place], [address] Phone: [phone]   Fax:  [phone]  Physical Therapy Treatment  Patient Details  Name: Tiffany Barber MRN: [id number] Date of Birth: 08/19/1940 Referring Provider (PT): Dr. Lanell Matarel Gaizo   Encounter Date: 02/07/2018    PT End of Session - 02/03/18 1254    Visit Number  3    Number of Visits  8    Date for PT Re-Evaluation  03/01/18    PT Start Time  1250    PT Stop Time  1330    PT Time Calculation (min)  40 min    Equipment Utilized During Treatment  Gait belt    Activity Tolerance  Patient tolerated treatment well;Patient limited by fatigue;Patient limited by pain    Behavior During Therapy  [facility] for tasks assessed/performed      Past Medical History:  Diagnosis Date  . 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 Hypertension    Subjective Assessment - 02/07/18    Subjective  Patient  reports no significant changes since her last session, nor falls.   Patient is accompained by:  Family member    Pertinent History  Pt. lives [place] son and daughter [place]  Pt. s/p R TKA in 2017.  Pt. has primarily used a w/c over past several years due to L knee pain.  Pt. had several falls in 2017 resulting in significant B shoulder joint arthritis/ stiffness.  Pt. requires use of transport to go to MD visits secondary to w/c use.  Pt. does limited activities outside and has a CNA assist with bathing (in bed) and household tasks 6 hours/ day.  Pt. states she does exercises with her aide on a regular basis.  Pt. likes going to church, cooking and reading books.      How long can you stand comfortably?  <2 min.     Patient Stated Goals  Increase B LE muscle strength to improve standing/ walking.      Currently in Pain?  Yes    Pain Score  6     Pain Location  Knee    Pain Orientation  Left    Pain Descriptors / Indicators  Tender   Pain Type  Surgical pain     Pain Onset  More than a month ago    History reviewed. No pertinent surgical history.  There were no vitals filed for this visit.    TREATMENT  Therapeutic Exercise: Nu Step L4, 12 min, Mod A for foot position and VCs for speed and pushing through whole foot. LAQ 1# x20 Seated hamstring curls #1 x20 Seated clamshell YTB x20 // bars: forward walking/ backward walking x4 laps, BUE support, CGA  Patient requires frequent seated rest breaks 2/2 to fatigue and strength deficits.         PT Long Term Goals - 02/02/18 1952      PT LONG TERM GOAL #1   Title  Pt. will increase FOTO to 52 to improve functional mobility.      Baseline  Initial FOTO: 41    Time  4    Period  Weeks    Status  New    Target Date  03/01/18      PT LONG TERM GOAL #2   Title  Pt. will increase L knee AROM (0 to >100 deg.) to improve functional mobility.     Baseline  R knee AROM:  0 to 113 deg. L knee AROM: -4 to 88 deg. and PROM: 107 deg.    Time  4    Period  Weeks    Status  New    Target Date  03/01/18      PT LONG TERM GOAL #3   Title  Pt. able to safely transfer from chair to RW with mod. I and proper technique to improve mobility.      Baseline  Mod. A x 1 to safely stand from chair.  PT has to stabilize RW and assist pt.     Time  4    Period  Weeks    Status  New    Target Date  03/01/18      PT LONG TERM GOAL #4   Title  Pt. will ambulate with 100 feet with CGA and use of RW with consistent step pattern safely.      Baseline  Pt. ambulates with limited B hip/knee flexion and no heel strike. Pt. able to ambulate 12 feet before requesting seated rest break.    Time  4    Period  Weeks    Status  New    Target Date  03/01/18         Plan - 02/07/18 1555    Clinical Impression Statement Patient continues to demonstrate excellent motivation for therapy, but is limited secondary to deficits in strength and activity tolerance. Patient was able to achieve greater knee flexion during  forward ambulation in the parallel bars with BUE support and CGA. Patient continues to require increased seated rest breaks between exercises in WB. Patient will continue to benefit from skilled therapeutic intervention to address aforementioned deficits and improve independence and safety.   Rehab Potential  Fair    Clinical Impairments Affecting Rehab Potential  B shoulder joint stiffness    PT Frequency  2x / week    PT Duration  4 weeks    PT Treatment/Interventions  ADLs/Self Care Home Management;Cryotherapy;Moist Heat;Gait training;Stair training;Functional mobility training;Neuromuscular re-education;Balance training;Therapeutic exercise;Therapeutic activities;Patient/family education;Manual techniques;Wheelchair mobility training;Scar mobilization;Passive range of motion    PT Next Visit Plan  B LE muscle strengthening/ gait training    PT Home Exercise Plan  see handouts.         Patient will benefit from skilled therapeutic intervention in order to improve the following deficits and impairments:  Abnormal gait, Improper body mechanics, Pain, Decreased mobility, Decreased scar mobility, Decreased activity tolerance, Decreased endurance, Decreased range of motion, Decreased strength, Hypomobility, Decreased balance, Decreased safety awareness, Difficulty walking, Impaired flexibility  Visit Diagnosis: Status post left knee replacement  Joint stiffness of knee, left  Muscle weakness (generalized)  Gait difficulty  Chronic pain of left knee     Problem List Patient Active Problem List   Diagnosis Date Noted  . Hypertension 12/01/2014  . Degenerative arthritis 11/21/2014   Sheria LangKatlin Harker PT, DPT [phone] 02/09/2018, 4:07 PM  [place] [facility] [address] [place], [place], [address] Phone: [phone]   Fax:  [phone]  Name: Tiffany Barber MRN: [id number] Date of Birth: 11/08/1940

## 2018-02-11 ENCOUNTER — Ambulatory Visit: Payer: Medicare Other | Admitting: Physical Therapy

## 2018-02-14 ENCOUNTER — Ambulatory Visit: Payer: Medicare Other | Admitting: Physical Therapy

## 2018-02-14 ENCOUNTER — Encounter: Payer: Self-pay | Admitting: Physical Therapy

## 2018-02-14 DIAGNOSIS — G8929 Other chronic pain: Secondary | ICD-10-CM

## 2018-02-14 DIAGNOSIS — M25662 Stiffness of left knee, not elsewhere classified: Secondary | ICD-10-CM

## 2018-02-14 DIAGNOSIS — Z96652 Presence of left artificial knee joint: Secondary | ICD-10-CM

## 2018-02-14 DIAGNOSIS — M6281 Muscle weakness (generalized): Secondary | ICD-10-CM

## 2018-02-14 DIAGNOSIS — M25562 Pain in left knee: Secondary | ICD-10-CM

## 2018-02-14 DIAGNOSIS — R269 Unspecified abnormalities of gait and mobility: Secondary | ICD-10-CM

## 2018-02-14 NOTE — Therapy (Signed)
[facility] [address] [place], [place], [address] Phone: [phone]   Fax:  [phone]  Physical Therapy Treatment  Patient Details  Name: Tiffany Barber MRN: [id number] Date of Birth: October 11, 1940 Referring Provider (PT): Dr. Lanell Matar   Encounter Date: 02/14/2018  PT End of Session - 02/14/18 1404    Visit Number  4    Number of Visits  8    Date for PT Re-Evaluation  03/01/18    PT Start Time  1331    PT Stop Time  1430    PT Time Calculation (min)  59 min    Equipment Utilized During Treatment  Gait belt    Activity Tolerance  Patient tolerated treatment well;Patient limited by fatigue    Behavior During Therapy  [facility] for tasks assessed/performed       Past Medical History:  Diagnosis Date  . 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 Hypertension     History reviewed. No pertinent surgical history.  There were no vitals filed for this visit.  Subjective Assessment - 02/14/18 1341    Subjective  Pt. arrived to PT via dtr. in law due to transportation not running during the holiday.  Pt. reports increase back pain but no L knee issues at this time. Pt. very resistant to walking from car to PT entry way, even with PT assist/ RW.  Pt. finally agreed and ambuated 12 feet prior to seated break in w/c.        Patient is accompained by:  Family member    Pertinent History  Pt. lives with son and daughter in [place].  Pt. s/p R TKA in 2017.  Pt. has primarily used a w/c over past several years due to L knee pain.  Pt. had several falls in 2017 resulting in significant B shoulder joint arthritis/ stiffness.  Pt. requires use of transport to go to MD visits secondary to w/c use.  Pt. does limited activities outside and has a CNA assist with bathing (in bed) and household tasks 6 hours/ day.  Pt. states she does exercises with her aide on a regular basis.  Pt. likes going to church, cooking and reading books.      How long  can you stand comfortably?  <2 min.     Patient Stated Goals  Increase B LE muscle strength to improve standing/ walking.      Currently in Pain?  Yes    Pain Score  5     Pain Location  Back    Pain Orientation  Lower    Pain Descriptors / Indicators  Aching;Sharp    Pain Type  Chronic pain         TREATMENT  Therapeutic Exercise: Nustep L4, 0 min, Mod A for foot position and VCs for speed and pushing through whole foot. Seated/ standing hip and knee ex.: LAQ/ marching/ heel raises/ toe raises x20.  Standing hip flexion/ abd./ extension/ knee flexion 20x (2 seated rest breaks).  // bars: forward walking/ backward walking x4 laps, BUE support, CGA. Propelling w/c in gym with use of B LE/knee flexion Reviewed HEP  Gait training: Ambulate in PT clinic with use of RW and cuing to increase BOS/ step pattern/ hip and knee flexion/ upright posture/ head position. Pt. Able to ambulate 12'/8'/ 5'/ 40' during tx. Session.  Pt. Requesting to sit due to back pain/ muscle fatigue.    PT Long Term Goals -  02/02/18 1952      PT LONG TERM GOAL #1   Title  Pt. will increase FOTO to 52 to improve functional mobility.      Baseline  Initial FOTO: 41    Time  4    Period  Weeks    Status  New    Target Date  03/01/18      PT LONG TERM GOAL #2   Title  Pt. will increase L knee AROM (0 to >100 deg.) to improve functional mobility.     Baseline  R knee AROM: 0 to 113 deg. L knee AROM: -4 to 88 deg. and PROM: 107 deg.    Time  4    Period  Weeks    Status  New    Target Date  03/01/18      PT LONG TERM GOAL #3   Title  Pt. able to safely transfer from chair to RW with mod. I and proper technique to improve mobility.      Baseline  Mod. A x 1 to safely stand from chair.  PT has to stabilize RW and assist pt.     Time  4    Period  Weeks    Status  New    Target Date  03/01/18      PT LONG TERM GOAL #4   Title  Pt. will ambulate with 100 feet with CGA and use of RW with consistent step  pattern safely.      Baseline  Pt. ambulates with limited B hip/knee flexion and no heel strike. Pt. able to ambulate 12 feet before requesting seated rest break.    Time  4    Period  Weeks    Status  New    Target Date  03/01/18            Plan - 02/14/18 1407    Clinical Impression Statement  Pt. requires frequent seated rest breasks t/o tx. session.  Pt. demonstrates ability to ambulate 26 feet prior to requesting seated rest break due to LE muscle fatigue/ low back discomfort.  Good L knee AROM during ther.ex. on blue mat table.  Pt. unable to complete L hip/ knee flexion step touches at 6" step with use of B UE assist.  L hip flexor muscle fatigue noted during standing ther.ex./ step touches.  PT encouraged pt. to ambulate increase distances at home with family assist for safety with RW.      Clinical Presentation  Evolving    Clinical Decision Making  Moderate    Rehab Potential  Fair    Clinical Impairments Affecting Rehab Potential  B shoulder joint stiffness    PT Frequency  2x / week    PT Duration  4 weeks    PT Treatment/Interventions  ADLs/Self Care Home Management;Cryotherapy;Moist Heat;Gait training;Stair training;Functional mobility training;Neuromuscular re-education;Balance training;Therapeutic exercise;Therapeutic activities;Patient/family education;Manual techniques;Wheelchair mobility training;Scar mobilization;Passive range of motion    PT Next Visit Plan  B LE muscle strengthening/ gait training    PT Home Exercise Plan  see handouts.        Patient will benefit from skilled therapeutic intervention in order to improve the following deficits and impairments:  Abnormal gait, Improper body mechanics, Pain, Decreased mobility, Decreased scar mobility, Decreased activity tolerance, Decreased endurance, Decreased range of motion, Decreased strength, Hypomobility, Decreased balance, Decreased safety awareness, Difficulty walking, Impaired flexibility  Visit  Diagnosis: Status post left knee replacement  Joint stiffness of knee, left  Muscle weakness (generalized)  Gait difficulty  Chronic pain of left knee     Problem List Patient Active Problem List   Diagnosis Date Noted  . Hypertension 12/01/2014  . Degenerative arthritis 11/21/2014   Cammie McgeeMichael C Rosaleah Person, PT, DPT # [phone] 02/14/2018, 9:29 PM   [facility] [address] [place], [place], [address] Phone: [phone]   Fax:  [phone]  Name: Rainey PinesViolet Rueckert MRN: [id number] Date of Birth: 01/20/1941

## 2018-02-17 ENCOUNTER — Encounter: Payer: Self-pay | Admitting: Physical Therapy

## 2018-02-17 ENCOUNTER — Ambulatory Visit: Payer: Medicare Other | Attending: Orthopedic Surgery | Admitting: Physical Therapy

## 2018-02-17 DIAGNOSIS — R269 Unspecified abnormalities of gait and mobility: Secondary | ICD-10-CM | POA: Diagnosis present

## 2018-02-17 DIAGNOSIS — M25562 Pain in left knee: Secondary | ICD-10-CM | POA: Insufficient documentation

## 2018-02-17 DIAGNOSIS — M6281 Muscle weakness (generalized): Secondary | ICD-10-CM | POA: Diagnosis present

## 2018-02-17 DIAGNOSIS — G8929 Other chronic pain: Secondary | ICD-10-CM | POA: Diagnosis present

## 2018-02-17 DIAGNOSIS — M25662 Stiffness of left knee, not elsewhere classified: Secondary | ICD-10-CM | POA: Diagnosis present

## 2018-02-17 DIAGNOSIS — Z96652 Presence of left artificial knee joint: Secondary | ICD-10-CM

## 2018-02-17 NOTE — Therapy (Addendum)
[facility] [address] [place], [place], [address] Phone: [phone]   Fax:  [phone]  Physical Therapy Treatment  Patient Details  Name: Tiffany Barber MRN: [id number] Date of Birth: 10-24-1940 Referring Provider (PT): Dr. Lanell Matar   Encounter Date: 02/17/2018  PT End of Session - 02/17/18 1306    Visit Number  5    Number of Visits  8    Date for PT Re-Evaluation  03/01/18    PT Start Time  1256    PT Stop Time  1357    PT Time Calculation (min)  61 min    Equipment Utilized During Treatment  Gait belt    Activity Tolerance  Patient tolerated treatment well;Patient limited by fatigue    Behavior During Therapy  [facility] for tasks assessed/performed       Past Medical History:  Diagnosis Date  . 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 Hypertension     History reviewed. No pertinent surgical history.  There were no vitals filed for this visit.  Subjective Assessment - 02/17/18 1304    Subjective  Pt. arrived to PT via transport in w/c.  Pt. instructed to use B LE to proper chair into PT clinic.  Pt. reports stiffness, not pain in L knee.  Pt. states she lied on heating pad this morning and back is doing better.      Patient is accompained by:  Family member    Pertinent History  Pt. lives with son and daughter in [place].  Pt. s/p R TKA in 2017.  Pt. has primarily used a w/c over past several years due to L knee pain.  Pt. had several falls in 2017 resulting in significant B shoulder joint arthritis/ stiffness.  Pt. requires use of transport to go to MD visits secondary to w/c use.  Pt. does limited activities outside and has a CNA assist with bathing (in bed) and household tasks 6 hours/ day.  Pt. states she does exercises with her aide on a regular basis.  Pt. likes going to church, cooking and reading books.      How long can you stand comfortably?  <2 min.     Patient Stated Goals  Increase B LE muscle strength  to improve standing/ walking.      Currently in Pain?  No/denies           TREATMENT   Gait training:  Ambulate in //-bars (forward/ backwards) working on hip/knee flexion and consistent step pattern/ heel strike.  Pt. Ambulates 4x in //-bars prior to rest break (x2).   Ambulate in PT clinic with use of RW and cuing to increase BOS/ step pattern/ hip and knee flexion/ upright posture/ head position. Pt. Ambulates 39'/ 28'/ 17' during gait training today.  PT attempted use of rollator but pt. Fearful at this time.     Therapeutic Exercise:  Propelling w/c in gym with use of B LE/knee flexion Standing in //-bars with 3" step touches on L/R (5x2)- less low back pain reported as compared to last PT tx. Session.  Seated/ standing hip and knee ex.: LAQ/ marching/ heel raises/ toe raises x20.  Standing hip flexion/ abd./ extension/ knee flexion 20x (2 seated rest breaks).  Nustep L4, , Mod A for foot position and VCs for speed and pushing through whole foot.   Manual tx.:  Seated L knee AAROM flexion/ patellar mobs. (all planes).  Distal quad STM  during knee extension stretches in sitting posture.      PT Long Term Goals - 02/02/18 1952      PT LONG TERM GOAL #1   Title  Pt. will increase FOTO to 52 to improve functional mobility.      Baseline  Initial FOTO: 41    Time  4    Period  Weeks    Status  New    Target Date  03/01/18      PT LONG TERM GOAL #2   Title  Pt. will increase L knee AROM (0 to >100 deg.) to improve functional mobility.     Baseline  R knee AROM: 0 to 113 deg. L knee AROM: -4 to 88 deg. and PROM: 107 deg.    Time  4    Period  Weeks    Status  New    Target Date  03/01/18      PT LONG TERM GOAL #3   Title  Pt. able to safely transfer from chair to RW with mod. I and proper technique to improve mobility.      Baseline  Mod. A x 1 to safely stand from chair.  PT has to stabilize RW and assist pt.     Time  4    Period  Weeks    Status  New     Target Date  03/01/18      PT LONG TERM GOAL #4   Title  Pt. will ambulate with 100 feet with CGA and use of RW with consistent step pattern safely.      Baseline  Pt. ambulates with limited B hip/knee flexion and no heel strike. Pt. able to ambulate 12 feet before requesting seated rest break.    Time  4    Period  Weeks    Status  New    Target Date  03/01/18        Pt. continues to require frequent seated rest breaks and encouragement to increase walking endurance. Marked increase in distance walked today with use of RW. Pt. fearful of trying to walk with rollator in hallway at this time. Pain limited and guarded with L knee manual stretches/ ROM in seated position. Pt. unable to complete 6" step touches at this time but using 3" plinth and UE assist.      Plan - 02/17/18 1306    Clinical Presentation  Evolving    Clinical Decision Making  Moderate    Rehab Potential  Fair    Clinical Impairments Affecting Rehab Potential  B shoulder joint stiffness    PT Frequency  2x / week    PT Duration  4 weeks    PT Treatment/Interventions  ADLs/Self Care Home Management;Cryotherapy;Moist Heat;Gait training;Stair training;Functional mobility training;Neuromuscular re-education;Balance training;Therapeutic exercise;Therapeutic activities;Patient/family education;Manual techniques;Wheelchair mobility training;Scar mobilization;Passive range of motion    PT Next Visit Plan  B LE muscle strengthening/ gait training    PT Home Exercise Plan  see handouts.        Patient will benefit from skilled therapeutic intervention in order to improve the following deficits and impairments:  Abnormal gait, Improper body mechanics, Pain, Decreased mobility, Decreased scar mobility, Decreased activity tolerance, Decreased endurance, Decreased range of motion, Decreased strength, Hypomobility, Decreased balance, Decreased safety awareness, Difficulty walking, Impaired flexibility  Visit Diagnosis: Status post  left knee replacement  Joint stiffness of knee, left  Muscle weakness (generalized)  Gait difficulty  Chronic pain of left knee     Problem List  Patient Active Problem List   Diagnosis Date Noted  . Hypertension 12/01/2014  . Degenerative arthritis 11/21/2014   Cammie McgeeMichael C Haniah Penny, PT, DPT # [phone] 02/18/2018, 2:44 PM  [facility] [address] [place], [place], [address] Phone: [phone]   Fax:  [phone]  Name: [name] MRN: [id number] Date of Birth: 06/11/1940

## 2018-02-22 ENCOUNTER — Encounter: Payer: Self-pay | Admitting: Physical Therapy

## 2018-02-22 ENCOUNTER — Ambulatory Visit: Payer: Medicare Other | Admitting: Physical Therapy

## 2018-02-22 DIAGNOSIS — G8929 Other chronic pain: Secondary | ICD-10-CM

## 2018-02-22 DIAGNOSIS — M25662 Stiffness of left knee, not elsewhere classified: Secondary | ICD-10-CM

## 2018-02-22 DIAGNOSIS — M25562 Pain in left knee: Secondary | ICD-10-CM

## 2018-02-22 DIAGNOSIS — Z96652 Presence of left artificial knee joint: Secondary | ICD-10-CM | POA: Diagnosis not present

## 2018-02-22 DIAGNOSIS — R269 Unspecified abnormalities of gait and mobility: Secondary | ICD-10-CM

## 2018-02-22 DIAGNOSIS — M6281 Muscle weakness (generalized): Secondary | ICD-10-CM

## 2018-02-22 NOTE — Therapy (Signed)
[facility] [address] [place], [place], [address] Phone: [phone]   Fax:  [phone]  Physical Therapy Treatment  Patient Details  Name: Tiffany Barber MRN: [id number] Date of Birth: [birth year] Referring Provider (PT): Dr. Lanell Matar   Encounter Date: 02/22/2018  PT End of Session - 02/22/18 1239    Visit Number  6    Number of Visits  8    Date for PT Re-Evaluation  03/01/18    PT Start Time  1226    PT Stop Time  1320    PT Time Calculation (min)  54 min    Equipment Utilized During Treatment  Gait belt    Activity Tolerance  Patient tolerated treatment well;Patient limited by fatigue;Patient limited by pain    Behavior During Therapy  [facility] for tasks assessed/performed       Past Medical History:  Diagnosis Date  . 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 Hypertension     History reviewed. No pertinent surgical history.  There were no vitals filed for this visit.  Subjective Assessment - 02/22/18 1228    Subjective  Patient states that she is having an upset stomach since last night. Patient threw up last night, but feels a little better this morning. Patient reports that L knee still hurts.     Patient is accompained by:  Family member    Pertinent History  Pt. lives with son and daughter in [place].  Pt. s/p R TKA in 2017.  Pt. has primarily used a w/c over past several years due to L knee pain.  Pt. had several falls in 2017 resulting in significant B shoulder joint arthritis/ stiffness.  Pt. requires use of transport to go to MD visits secondary to w/c use.  Pt. does limited activities outside and has a CNA assist with bathing (in bed) and household tasks 6 hours/ day.  Pt. states she does exercises with her aide on a regular basis.  Pt. likes going to church, cooking and reading books.      How long can you stand comfortably?  <2 min.     Patient Stated Goals  Increase B LE muscle strength to improve  standing/ walking.      Currently in Pain?  Yes    Pain Score  7     Pain Location  Knee    Pain Orientation  Left    Pain Descriptors / Indicators  Aching         TREATMENT Therapeutic Exercise:  Ambulate in //-bars (forward/ backwards) working on hip/knee flexion and consistent step pattern/ heel strike.  Pt. Ambulates 3x in //-bars prior to rest break (x2).  Ambulate in // bars (side stepping) w/ BUE support working on weight shift. Patient able to tolerate 1 lap  prior to fatiguing. Propelling w/c in gym with use of B LE/knee flexion Seated/ standing hip and knee ex.:LAQ/ marching/ heel raises/ toe raisesx20. Standing hip flexion/ abd./ extension/ knee flexion 20x (2 seated rest breaks).  Nustep L5,41min, Mod A for foot position and VCs for speed and pushing through whole foot.   Manual tx.:  Seated L knee AAROM flexion/ patellar mobs. (all planes).  Distal quad STM during knee extension stretches in sitting posture.    PT Education - 02/22/18 1238    Education Details  exercie technique, gait technique    Person(s) Educated  Patient    Methods  Explanation;Demonstration;Verbal cues  Comprehension  Verbalized understanding;Need further instruction;Returned demonstration;Verbal cues required          PT Long Term Goals - 02/02/18 1952      PT LONG TERM GOAL #1   Title  Pt. will increase FOTO to 52 to improve functional mobility.      Baseline  Initial FOTO: 41    Time  4    Period  Weeks    Status  New    Target Date  03/01/18      PT LONG TERM GOAL #2   Title  Pt. will increase L knee AROM (0 to >100 deg.) to improve functional mobility.     Baseline  R knee AROM: 0 to 113 deg. L knee AROM: -4 to 88 deg. and PROM: 107 deg.    Time  4    Period  Weeks    Status  New    Target Date  03/01/18      PT LONG TERM GOAL #3   Title  Pt. able to safely transfer from chair to RW with mod. I and proper technique to improve mobility.      Baseline  Mod. A x 1  to safely stand from chair.  PT has to stabilize RW and assist pt.     Time  4    Period  Weeks    Status  New    Target Date  03/01/18      PT LONG TERM GOAL #4   Title  Pt. will ambulate with 100 feet with CGA and use of RW with consistent step pattern safely.      Baseline  Pt. ambulates with limited B hip/knee flexion and no heel strike. Pt. able to ambulate 12 feet before requesting seated rest break.    Time  4    Period  Weeks    Status  New    Target Date  03/01/18            Plan - 02/22/18 1314    Clinical Impression Statement  Patient presents to clinic with decreased energy secondary to gastrointestinal upsets, but is amenable to therapy. Patient demonstrates good patellar mobility and is able to achieve greater L knee flexion during ambulation with mirror feedback. Patient will continue to benefit from skilled therapeutic intervention to address deficits in strength, mobility, balance and function for improved overall QOL.    Rehab Potential  Fair    Clinical Impairments Affecting Rehab Potential  B shoulder joint stiffness    PT Frequency  2x / week    PT Duration  4 weeks    PT Treatment/Interventions  ADLs/Self Care Home Management;Cryotherapy;Moist Heat;Gait training;Stair training;Functional mobility training;Neuromuscular re-education;Balance training;Therapeutic exercise;Therapeutic activities;Patient/family education;Manual techniques;Wheelchair mobility training;Scar mobilization;Passive range of motion    PT Next Visit Plan  B LE muscle strengthening/ gait training    PT Home Exercise Plan  see handouts.        Patient will benefit from skilled therapeutic intervention in order to improve the following deficits and impairments:  Abnormal gait, Improper body mechanics, Pain, Decreased mobility, Decreased scar mobility, Decreased activity tolerance, Decreased endurance, Decreased range of motion, Decreased strength, Hypomobility, Decreased balance, Decreased  safety awareness, Difficulty walking, Impaired flexibility  Visit Diagnosis: Status post left knee replacement  Joint stiffness of knee, left  Muscle weakness (generalized)  Gait difficulty  Chronic pain of left knee     Problem List Patient Active Problem List   Diagnosis Date Noted  . Hypertension 12/01/2014  .  Degenerative arthritis 11/21/2014   Sheria Lang PT, DPT [phone] 02/22/2018, 1:27 PM  [facility] [address] [place], [place], [address] Phone: [phone]   Fax:  [phone]  Name: Tiffany Barber MRN: [id number] Date of Birth: 08-21-1940

## 2018-02-24 ENCOUNTER — Ambulatory Visit: Payer: Medicare Other | Admitting: Physical Therapy

## 2018-02-24 ENCOUNTER — Encounter: Payer: Self-pay | Admitting: Physical Therapy

## 2018-02-24 DIAGNOSIS — M6281 Muscle weakness (generalized): Secondary | ICD-10-CM

## 2018-02-24 DIAGNOSIS — Z96652 Presence of left artificial knee joint: Secondary | ICD-10-CM

## 2018-02-24 DIAGNOSIS — R269 Unspecified abnormalities of gait and mobility: Secondary | ICD-10-CM

## 2018-02-24 DIAGNOSIS — M25562 Pain in left knee: Secondary | ICD-10-CM

## 2018-02-24 DIAGNOSIS — G8929 Other chronic pain: Secondary | ICD-10-CM

## 2018-02-24 DIAGNOSIS — M25662 Stiffness of left knee, not elsewhere classified: Secondary | ICD-10-CM

## 2018-02-24 NOTE — Therapy (Signed)
[facility] [address] [place], [place], [address] Phone: [phone]   Fax:  [phone]  Physical Therapy Treatment  Patient Details  Name: Tiffany Barber MRN: [id number] Date of Birth: 20-Apr-1940 Referring Provider (PT): Dr. Lanell Matar   Encounter Date: 02/24/2018  PT End of Session - 02/24/18 1244    Visit Number  7    Number of Visits  8    Date for PT Re-Evaluation  03/01/18    PT Start Time  1244    PT Stop Time  1342    PT Time Calculation (min)  58 min    Equipment Utilized During Treatment  Gait belt    Activity Tolerance  Patient tolerated treatment well;Patient limited by fatigue;Patient limited by pain    Behavior During Therapy  [facility] for tasks assessed/performed       Past Medical History:  Diagnosis Date  . 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 Hypertension     History reviewed. No pertinent surgical history.  There were no vitals filed for this visit.  Subjective Assessment - 02/24/18 1240    Subjective  Pt. arrived in w/c via transport with no new issues.  Pt. reports slight discomfort in L knee/ low back.      Patient is accompained by:  Family member    Pertinent History  Pt. lives with son and daughter in [place].  Pt. s/p R TKA in 2017.  Pt. has primarily used a w/c over past several years due to L knee pain.  Pt. had several falls in 2017 resulting in significant B shoulder joint arthritis/ stiffness.  Pt. requires use of transport to go to MD visits secondary to w/c use.  Pt. does limited activities outside and has a CNA assist with bathing (in bed) and household tasks 6 hours/ day.  Pt. states she does exercises with her aide on a regular basis.  Pt. likes going to church, cooking and reading books.      How long can you stand comfortably?  <2 min.     Patient Stated Goals  Increase B LE muscle strength to improve standing/ walking.      Currently in Pain?  Other (Comment)   no subjective #  given.        TREATMENT  Therapeutic Exercise:  Propelling w/c in gym with use of B LE/knee flexion Ambulate in //-bars (forward/ backwards) working on hip/knee flexion and consistent step pattern/ heel strike.  Ambulates 3x in //-bars prior to rest break (x2).  Standing hip and knee ex.: hip flexion/ abd./ extension/ knee flexion 20x (1 seated rest breaks).  Walking with RW: 30 feet/ 19 feet working on posture/ step pattern/ heel strike.  Nustep L5,47min, Less verbal cuing today and only 1 short rest break  Reviewed progressive HEP.   Manual tx.:  Seated L knee AAROM flexion/ patellar mobs. (all planes). Distal quad STM during knee extension stretches in sitting posture. Patellar mobs. (all planes).   Supine L knee PROM: 105 deg. (increase discomfort reported).          PT Long Term Goals - 02/02/18 1952      PT LONG TERM GOAL #1   Title  Pt. will increase FOTO to 52 to improve functional mobility.      Baseline  Initial FOTO: 41    Time  4    Period  Weeks    Status  New  Target Date  03/01/18      PT LONG TERM GOAL #2   Title  Pt. will increase L knee AROM (0 to >100 deg.) to improve functional mobility.     Baseline  R knee AROM: 0 to 113 deg. L knee AROM: -4 to 88 deg. and PROM: 107 deg.    Time  4    Period  Weeks    Status  New    Target Date  03/01/18      PT LONG TERM GOAL #3   Title  Pt. able to safely transfer from chair to RW with mod. I and proper technique to improve mobility.      Baseline  Mod. A x 1 to safely stand from chair.  PT has to stabilize RW and assist pt.     Time  4    Period  Weeks    Status  New    Target Date  03/01/18      PT LONG TERM GOAL #4   Title  Pt. will ambulate with 100 feet with CGA and use of RW with consistent step pattern safely.      Baseline  Pt. ambulates with limited B hip/knee flexion and no heel strike. Pt. able to ambulate 12 feet before requesting seated rest break.    Time  4    Period  Weeks     Status  New    Target Date  03/01/18         Plan - 02/24/18 1244    Clinical Impression Statement  Pt. ambulated in parallel bars with decreased UE assist and more consistent hip and knee flexion in gait pattern. Frequent rest breaks are still required. Pt. was able to ambulate with RW 630ft from parallel bars to table. Pt.  demonstrated increased knee flexion in supine of 105 degrees passively. Due to decreased shld ROM, pt. still pulls with UEs from sit to stand transitions. Pt. will still benefit from PT to increase knee ROM to encourage a safer gait pattern and function during ADLs.    Clinical Presentation  Evolving    Clinical Decision Making  Moderate    Rehab Potential  Fair    Clinical Impairments Affecting Rehab Potential  B shoulder joint stiffness    PT Frequency  2x / week    PT Duration  4 weeks    PT Treatment/Interventions  ADLs/Self Care Home Management;Cryotherapy;Moist Heat;Gait training;Stair training;Functional mobility training;Neuromuscular re-education;Balance training;Therapeutic exercise;Therapeutic activities;Patient/family education;Manual techniques;Wheelchair mobility training;Scar mobilization;Passive range of motion    PT Next Visit Plan  B LE muscle strengthening/ gait training    PT Home Exercise Plan  see handouts.        Patient will benefit from skilled therapeutic intervention in order to improve the following deficits and impairments:  Abnormal gait, Improper body mechanics, Pain, Decreased mobility, Decreased scar mobility, Decreased activity tolerance, Decreased endurance, Decreased range of motion, Decreased strength, Hypomobility, Decreased balance, Decreased safety awareness, Difficulty walking, Impaired flexibility  Visit Diagnosis: Status post left knee replacement  Joint stiffness of knee, left  Muscle weakness (generalized)  Gait difficulty  Chronic pain of left knee     Problem List Patient Active Problem List   Diagnosis Date  Noted  . Hypertension 12/01/2014  . Degenerative arthritis 11/21/2014   Cammie McgeeMichael C Tyreik Delahoussaye, PT, DPT # [id number] 02/24/2018, 2:26 PM  [facility] [address] [place], [place], [address] Phone: [phone]   Fax:  [phone]  Name: Tiffany Barber MRN: [id number] Date of Birth: 11-Mar-1940

## 2018-02-28 ENCOUNTER — Encounter: Payer: Self-pay | Admitting: Physical Therapy

## 2018-02-28 ENCOUNTER — Ambulatory Visit: Payer: Medicare Other | Admitting: Physical Therapy

## 2018-02-28 DIAGNOSIS — M25562 Pain in left knee: Secondary | ICD-10-CM

## 2018-02-28 DIAGNOSIS — M6281 Muscle weakness (generalized): Secondary | ICD-10-CM

## 2018-02-28 DIAGNOSIS — Z96652 Presence of left artificial knee joint: Secondary | ICD-10-CM

## 2018-02-28 DIAGNOSIS — M25662 Stiffness of left knee, not elsewhere classified: Secondary | ICD-10-CM

## 2018-02-28 DIAGNOSIS — G8929 Other chronic pain: Secondary | ICD-10-CM

## 2018-02-28 DIAGNOSIS — R269 Unspecified abnormalities of gait and mobility: Secondary | ICD-10-CM

## 2018-02-28 NOTE — Therapy (Signed)
[facility] [address] [place], [place], [address] Phone: [phone]   Fax:  [phone]  Physical Therapy Treatment  Patient Details  Name: Tiffany Barber MRN: [id number] Date of Birth: 05/23/1940 Referring Provider (PT): Dr. Lanell Matarel Gaizo   Encounter Date: 02/28/2018  PT End of Session - 02/28/18 1302    Visit Number  8    Number of Visits  8    Date for PT Re-Evaluation  03/01/18    PT Start Time  1300    PT Stop Time  1345    PT Time Calculation (min)  45 min    Equipment Utilized During Treatment  Gait belt    Activity Tolerance  Patient tolerated treatment well;Patient limited by fatigue;Patient limited by pain    Behavior During Therapy  [facility] for tasks assessed/performed       Past Medical History:  Diagnosis Date  . 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 Hypertension     History reviewed. No pertinent surgical history.  There were no vitals filed for this visit.  Subjective Assessment - 02/28/18 1300    Subjective  Patient arrived in w/c via transport. Patient states her stomach is upset again, but the knee feels "not too bad."    Patient is accompained by:  Family member    Pertinent History  Pt. lives [place] son and daughter [place].  Pt. s/p R TKA in 2017.  Pt. has primarily used a w/c over past several years due to L knee pain.  Pt. had several falls in 2017 resulting in significant B shoulder joint arthritis/ stiffness.  Pt. requires use of transport to go to MD visits secondary to w/c use.  Pt. does limited activities outside and has a CNA assist with bathing (in bed) and household tasks 6 hours/ day.  Pt. states she does exercises with her aide on a regular basis.  Pt. likes going to church, cooking and reading books.      How long can you stand comfortably?  <2 min.     Patient Stated Goals  Increase B LE muscle strength to improve standing/ walking.      Currently in Pain?  Yes    Pain Location   Knee    Pain Orientation  Left;Proximal    Pain Descriptors / Indicators  Sore    Pain Type  Chronic pain    Pain Onset  More than a month ago       TREATMENT  Therapeutic Exercise:  Propelling w/c in gym with use of B LE/knee flexion, 1 lap approximately 30 feet.  Ambulate in //-bars (forward/ backwards) working on hip/knee flexion and consistent step pattern/ heel strike.  Ambulates3x in //-bars prior to rest break (x3). VCs for knee flexion with noted increased ease during retro-ambulation on L knee.  Sit to stand from elevated surface, BUE support, CGA x5. Patient uses knee and hip extension against seat edge to assist with standing.   Patient educated on exercise technique throughout session.   Manual tx.:  Seated L knee AAROM flexion with posterior force via belt assist at distal tibia 10 x 20 sec hold.  Post-intervention: Seated AROM L knee flexion 103 degrees with no complaint of pain, but report of "good stretch." Distal quad STM during knee extension stretches in sitting posture with increased tenderness along the ITB. Patellar mobs, grade III superior/inferior/medial/lateral. 30 sec boutsx3 each.      PT Long  Term Goals - 02/02/18 1952      PT LONG TERM GOAL #1   Title  Pt. will increase FOTO to 52 to improve functional mobility.      Baseline  Initial FOTO: 41    Time  4    Period  Weeks    Status  New    Target Date  03/01/18      PT LONG TERM GOAL #2   Title  Pt. will increase L knee AROM (0 to >100 deg.) to improve functional mobility.     Baseline  R knee AROM: 0 to 113 deg. L knee AROM: -4 to 88 deg. and PROM: 107 deg.    Time  4    Period  Weeks    Status  New    Target Date  03/01/18      PT LONG TERM GOAL #3   Title  Pt. able to safely transfer from chair to RW with mod. I and proper technique to improve mobility.      Baseline  Mod. A x 1 to safely stand from chair.  PT has to stabilize RW and assist pt.     Time  4    Period  Weeks     Status  New    Target Date  03/01/18      PT LONG TERM GOAL #4   Title  Pt. will ambulate with 100 feet with CGA and use of RW with consistent step pattern safely.      Baseline  Pt. ambulates with limited B hip/knee flexion and no heel strike. Pt. able to ambulate 12 feet before requesting seated rest break.    Time  4    Period  Weeks    Status  New    Target Date  03/01/18            Plan - 03/01/18 1517    Clinical Impression Statement  Patient demonstrating increased tolerance to standing activities as evidenced by her ability to complete multiple bouts of walking between parallel bars with lower duration seated rest. Patient continues able to achieve greater active L knee flexion after manual intervention (103 degrees). Patient will continue to benefit from skilled therapeutic intervention in order to address remaining deficits in strength, mobility, gait mechanics, and ROM for improved independence and QOL.    Rehab Potential  Fair    Clinical Impairments Affecting Rehab Potential  B shoulder joint stiffness    PT Frequency  2x / week    PT Duration  4 weeks    PT Treatment/Interventions  ADLs/Self Care Home Management;Cryotherapy;Moist Heat;Gait training;Stair training;Functional mobility training;Neuromuscular re-education;Balance training;Therapeutic exercise;Therapeutic activities;Patient/family education;Manual techniques;Wheelchair mobility training;Scar mobilization;Passive range of motion    PT Next Visit Plan  B LE muscle strengthening/ gait training    PT Home Exercise Plan  see handouts.        Patient will benefit from skilled therapeutic intervention in order to improve the following deficits and impairments:  Abnormal gait, Improper body mechanics, Pain, Decreased mobility, Decreased scar mobility, Decreased activity tolerance, Decreased endurance, Decreased range of motion, Decreased strength, Hypomobility, Decreased balance, Decreased safety awareness, Difficulty  walking, Impaired flexibility  Visit Diagnosis: Status post left knee replacement  Joint stiffness of knee, left  Muscle weakness (generalized)  Gait difficulty  Chronic pain of left knee     Problem List Patient Active Problem List   Diagnosis Date Noted  . Hypertension 12/01/2014  . Degenerative arthritis 11/21/2014  Sheria Lang PT, DPT [id number] 03/01/2018, 3:25 PM  [facility] [address] [place], [place], [address] Phone: [phone]   Fax:  [phone]  Name: Tiffany Barber MRN: [id number] Date of Birth: 05-03-1940

## 2018-03-03 ENCOUNTER — Encounter: Payer: Self-pay | Admitting: Physical Therapy

## 2018-03-03 ENCOUNTER — Ambulatory Visit: Payer: Medicare Other | Admitting: Physical Therapy

## 2018-03-03 DIAGNOSIS — M25662 Stiffness of left knee, not elsewhere classified: Secondary | ICD-10-CM

## 2018-03-03 DIAGNOSIS — Z96652 Presence of left artificial knee joint: Secondary | ICD-10-CM

## 2018-03-03 DIAGNOSIS — R269 Unspecified abnormalities of gait and mobility: Secondary | ICD-10-CM

## 2018-03-03 DIAGNOSIS — M6281 Muscle weakness (generalized): Secondary | ICD-10-CM

## 2018-03-03 DIAGNOSIS — G8929 Other chronic pain: Secondary | ICD-10-CM

## 2018-03-03 DIAGNOSIS — M25562 Pain in left knee: Secondary | ICD-10-CM

## 2018-03-03 NOTE — Therapy (Signed)
[facility] [address] [place], [place], [address] Phone: [phone]   Fax:  [phone]  Physical Therapy Treatment  Patient Details  Name: Tiffany Barber MRN: [id number] Date of Birth: [birth year] Referring Provider (PT): Dr. Rozelle Logan   Encounter Date: 03/03/2018  PT End of Session - 03/03/18 1438    Visit Number  9    Number of Visits  17    Date for PT Re-Evaluation  03/31/18    PT Start Time  0122    PT Stop Time  0219    PT Time Calculation (min)  57 min    Equipment Utilized During Treatment  Gait belt    Activity Tolerance  Patient tolerated treatment well;Patient limited by fatigue;Patient limited by pain    Behavior During Therapy  [facility] for tasks assessed/performed       Past Medical History:  Diagnosis Date  . 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 Hypertension     History reviewed. No pertinent surgical history.  There were no vitals filed for this visit.  Subjective Assessment - 03/03/18 1336    Subjective  Pt. arrived in w/c via transport. Pt. reports doing HEP and walking in kitchen.     Pertinent History  Pt. lives with son and daughter in law.  Pt. s/p R TKA in 2017.  Pt. has primarily used a w/c over past several years due to L knee pain.  Pt. had several falls in 2017 resulting in significant B shoulder joint arthritis/ stiffness.  Pt. requires use of transport to go to MD visits secondary to w/c use.  Pt. does limited activities outside and has a CNA assist with bathing (in bed) and household tasks 6 hours/ day.  Pt. states she does exercises with her aide on a regular basis.  Pt. likes going to church, cooking and reading books.      Patient Stated Goals  Increase B LE muscle strength to improve standing/ walking.      Currently in Pain?  Yes    Pain Score  7     Pain Location  Back    Pain Orientation  Left;Mid;Lower    Pain Onset  More than a month ago        TREATMENT  Therapeutic Exercise:  Fwd/bkwd walking in //-bars 4x (needed 2 rest breaks) (cues for increased hip/knee flexion, heel strike and stride length) Attempt at sidestepping in //-bars but stopped due to c/o increased pain Sit to stand from chair (with 1 airrex pad) VCs for equal WB and less UE assist 5x Seated A/AROM knee flexion reassessment (103 degrees)  NuStep 10 min with one water break  Manual tx.: No charge Quad, ITB and gastroc STM (pt. Reported relief of sx)  Gait training:  Pt. Ambulates in hallway (60 feet) with use of RW and CGA to encourage consistent step pattern/ heel strike and upright posture.  Pt. Presents with slight L antalgic gait pattern with limited hip and knee flexion during swing through phase of gait.  Pt. Easily fatigued and requests several rest breaks to sit.  PT provided moderate cuing to instruct pt. On step length/ knee flexion.   Pt. Ambulates 25 feet from //-bars to mat table with RW (max. Encouragement to prevent seated rest break).     PT Long Term Goals - 03/03/18 1538      PT LONG TERM GOAL #1   Title  Pt. will increase  FOTO to 52 to improve functional mobility.      Baseline  Initial FOTO: 41    Time  4    Period  Weeks    Status  On-going    Target Date  03/31/18      PT LONG TERM GOAL #2   Title  Pt. will increase L knee AROM (0 to >100 deg.) to improve functional mobility.     Baseline  L knee AROM: 0 to 103 deg. (seated position).      Time  4    Period  Weeks    Status  Achieved    Target Date  03/03/18      PT LONG TERM GOAL #3   Title  Pt. able to safely transfer from chair to [place] with mod. I and proper technique to improve mobility.      Baseline  Mod. A x 1 to safely stand from chair.  PT has to stabilize RW and assist pt.     Time  4    Period  Weeks    Status  Not Met    Target Date  03/31/18      PT LONG TERM GOAL #4   Title  Pt. will ambulate with 100 feet with CGA and use of RW with consistent step  pattern safely.      Baseline  Pt. ambulates with limited B hip/knee flexion and no heel strike.  Max ambulation 60 feet in hallway (fatigue).      Time  4    Period  Weeks    Status  Partially Met    Target Date  03/31/18          Plan - 03/03/18 1439    Clinical Impression Statement  Pt. showed increased tolerance with ambulating in hallway and clinic with RW (60 feet).  Pt. needs verbal cues to encourage heel strike, stride length and overall endurance with RW. Pt. had concordant sx and increased fatigue as walking duration increased.  Pt. found relief with STM to L quad, ITB and gastroc.  Increase L knee flexion in seated position (103 deg.).  Pt. requires mod. A x 1 to assist with sit to stands into RW (pt. prefers L side UE assist from PT).  Pt. will continue to benefit from skilled PT services in order to improve L knee ROM and LE strength to encourage a more independent gait pattern.     Clinical Presentation  Evolving    Clinical Decision Making  Moderate    Rehab Potential  Fair    Clinical Impairments Affecting Rehab Potential  B shoulder joint stiffness    PT Frequency  2x / week    PT Duration  4 weeks    PT Treatment/Interventions  ADLs/Self Care Home Management;Cryotherapy;Moist Heat;Gait training;Stair training;Functional mobility training;Neuromuscular re-education;Balance training;Therapeutic exercise;Therapeutic activities;Patient/family education;Manual techniques;Wheelchair mobility training;Scar mobilization;Passive range of motion    PT Next Visit Plan  Continue B LE strengthening and gait endurance    PT Home Exercise Plan  see handouts.        Patient will benefit from skilled therapeutic intervention in order to improve the following deficits and impairments:  Abnormal gait, Improper body mechanics, Pain, Decreased mobility, Decreased scar mobility, Decreased activity tolerance, Decreased endurance, Decreased range of motion, Decreased strength, Hypomobility,  Decreased balance, Decreased safety awareness, Difficulty walking, Impaired flexibility  Visit Diagnosis: Status post left knee replacement  Joint stiffness of knee, left  Muscle weakness (generalized)  Gait difficulty  Chronic  pain of left knee     Problem List Patient Active Problem List   Diagnosis Date Noted  . Hypertension 12/01/2014  . Degenerative arthritis 11/21/2014   Pura Spice, PT, DPT # [id number] [place], [place] 03/03/2018, 3:41 PM  [facility] [address] [place], [place], [address] Phone: [phone]   Fax:  [phone]  Name: Tiffany Barber MRN: [id number] Date of Birth: Jan 29, 1941

## 2018-03-07 ENCOUNTER — Encounter: Payer: Medicare Other | Admitting: Physical Therapy

## 2018-03-10 ENCOUNTER — Encounter: Payer: Self-pay | Admitting: Physical Therapy

## 2018-03-10 ENCOUNTER — Ambulatory Visit: Payer: Medicare Other | Admitting: Physical Therapy

## 2018-03-10 DIAGNOSIS — M25662 Stiffness of left knee, not elsewhere classified: Secondary | ICD-10-CM

## 2018-03-10 DIAGNOSIS — Z96652 Presence of left artificial knee joint: Secondary | ICD-10-CM | POA: Diagnosis not present

## 2018-03-10 DIAGNOSIS — M25562 Pain in left knee: Secondary | ICD-10-CM

## 2018-03-10 DIAGNOSIS — G8929 Other chronic pain: Secondary | ICD-10-CM

## 2018-03-10 DIAGNOSIS — R269 Unspecified abnormalities of gait and mobility: Secondary | ICD-10-CM

## 2018-03-10 DIAGNOSIS — M6281 Muscle weakness (generalized): Secondary | ICD-10-CM

## 2018-03-10 NOTE — Therapy (Signed)
[facility] [address] [place], [place], [address] Phone: [phone]   Fax:  [phone]  Physical Therapy Treatment  Patient Details  Name: Tiffany Barber MRN: [id number] Date of Birth: 1940-07-17 Referring Provider (PT): Dr. Rozelle Logan   Encounter Date: 03/10/2018  PT End of Session - 03/10/18 1408    Visit Number  10    Number of Visits  17    Date for PT Re-Evaluation  03/31/18    PT Start Time  8115    PT Stop Time  1402    PT Time Calculation (min)  66 min    Equipment Utilized During Treatment  Gait belt    Activity Tolerance  Patient tolerated treatment well;Patient limited by fatigue;Patient limited by pain    Behavior During Therapy  [facility] for tasks assessed/performed       Past Medical History:  Diagnosis Date  . 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 Hypertension     History reviewed. No pertinent surgical history.  There were no vitals filed for this visit.  Subjective Assessment - 03/10/18 1406    Subjective  Pt. reports she is "not bad at all today" but L side of neck has been bothering her the past day. Pt. reports still being active at home by walking throughout the day with RW and doing HEP in w/c.  Pts. daughter-in-law states the pt. is still using bedside commode and not walking into bathroom.      Pertinent History  Pt. lives with son and daughter in law.  Pt. s/p R TKA in 2017.  Pt. has primarily used a w/c over past several years due to L knee pain.  Pt. had several falls in 2017 resulting in significant B shoulder joint arthritis/ stiffness.  Pt. requires use of transport to go to MD visits secondary to w/c use.  Pt. does limited activities outside and has a CNA assist with bathing (in bed) and household tasks 6 hours/ day.  Pt. states she does exercises with her aide on a regular basis.  Pt. likes going to church, cooking and reading books.      Limitations  Standing;Walking;House hold  activities    How long can you stand comfortably?  <2 min.     Patient Stated Goals  Increase B LE muscle strength to improve standing/ walking.      Currently in Pain?  No/denies    Pain Score  0-No pain       Treatment:  Therex tx:  Fwd/bkwd walking in //-bars 4 laps (rest break requested after, min CGA) Side stepping //-bars 3 laps Seated HS curls GTB 10x B Seated LAQ 2.5# 2x10 B Seated high marches 2.5# 2x10 Seated adduction ball squeezes 2x10 NuStep 10 min. L 5  Gait training:   Pt. Ambulates in hallway and gym (110 feet total with one seated rest break requested by pt.) with use of RW and CGA to encourage increased step length, heel strike, hip/ knee flexion and upright posture.  Pt. Presents with stiff L antalgic gait pattern with decreased hip and knee flexion during swing through phase of gait.   PT provided moderate verbal cuing to instruct pt. On step length hip/ knee flexion and safe ambulation with RW at home.       PT Education - 03/10/18 1407    Education Details  safe technique when getting out of w/c to stand with RW (use R hand  to push up, L hand on walker)    Person(s) Educated  Patient    Methods  Explanation;Demonstration;Tactile cues;Verbal cues    Comprehension  Verbalized understanding;Returned demonstration;Verbal cues required;Tactile cues required        PT Long Term Goals - 03/03/18 1538      PT LONG TERM GOAL #1   Title  Pt. will increase FOTO to 52 to improve functional mobility.      Baseline  Initial FOTO: 41    Time  4    Period  Weeks    Status  On-going    Target Date  03/31/18      PT LONG TERM GOAL #2   Title  Pt. will increase L knee AROM (0 to >100 deg.) to improve functional mobility.     Baseline  L knee AROM: 0 to 103 deg. (seated position).      Time  4    Period  Weeks    Status  Achieved    Target Date  03/03/18      PT LONG TERM GOAL #3   Title  Pt. able to safely transfer from chair to [place] with mod. I and proper  technique to improve mobility.      Baseline  Mod. A x 1 to safely stand from chair.  PT has to stabilize RW and assist pt.     Time  4    Period  Weeks    Status  Not Met    Target Date  03/31/18      PT LONG TERM GOAL #4   Title  Pt. will ambulate with 100 feet with CGA and use of RW with consistent step pattern safely.      Baseline  Pt. ambulates with limited B hip/knee flexion and no heel strike.  Max ambulation 60 feet in hallway (fatigue).      Time  4    Period  Weeks    Status  Partially Met    Target Date  03/31/18        Plan - 03/10/18 1414    Clinical Impression Statement  Pt. continues to show improvement in gait endurance and distance as she walked 110 ft with one seated rest break requested by pt. Pt. demonstrates pulling up with B UE when sitting from w/c to standing at walker. PT educated pt. on the appropriate and safe way to stand from w/c and encouraged pt. to do this at home so she is safe. PT also reminded pt. that this will help her strengthen B quads and require less work from [place] which are limited in strength and ROM. Pt. needed mod. verbal and tactile cues to encourage pt. to use this technique throughout session today. Pt. continues to need mod. verbal cues when ambulating to encourage endurance, hip/ knee flexion, DF and overall posture. Pt. will continue to benefit from skilled PT services to improve safety when ambulating with walker, transitioning out of w/c and increase B LE strength and ROM.    Clinical Presentation  Evolving    Clinical Decision Making  Moderate    Rehab Potential  Fair    Clinical Impairments Affecting Rehab Potential  B shoulder joint stiffness    PT Frequency  2x / week    PT Duration  4 weeks    PT Treatment/Interventions  ADLs/Self Care Home Management;Cryotherapy;Moist Heat;Gait training;Stair training;Functional mobility training;Neuromuscular re-education;Balance training;Therapeutic exercise;Therapeutic activities;Patient/family  education;Manual techniques;Wheelchair mobility training;Scar mobilization;Passive range of motion    PT  Next Visit Plan  Continue B LE strengthening, ROM (manual) and gait endurance,     PT Home Exercise Plan  see handouts.        Patient will benefit from skilled therapeutic intervention in order to improve the following deficits and impairments:  Abnormal gait, Improper body mechanics, Pain, Decreased mobility, Decreased scar mobility, Decreased activity tolerance, Decreased endurance, Decreased range of motion, Decreased strength, Hypomobility, Decreased balance, Decreased safety awareness, Difficulty walking, Impaired flexibility  Visit Diagnosis: Status post left knee replacement  Joint stiffness of knee, left  Muscle weakness (generalized)  Gait difficulty  Chronic pain of left knee     Problem List Patient Active Problem List   Diagnosis Date Noted  . Hypertension 12/01/2014  . Degenerative arthritis 11/21/2014   Pura Spice, PT, DPT # [id number] [place], [place] 03/11/2018, 7:21 AM  [place] [facility] [address] [place], [place], [address] Phone: [phone]   Fax:  [phone]  Name: Tiffany Barber MRN: [id number] Date of Birth: Jan 12, 1941

## 2018-03-16 ENCOUNTER — Encounter: Payer: Self-pay | Admitting: Physical Therapy

## 2018-03-16 ENCOUNTER — Ambulatory Visit: Payer: Medicare Other | Admitting: Physical Therapy

## 2018-03-16 DIAGNOSIS — G8929 Other chronic pain: Secondary | ICD-10-CM

## 2018-03-16 DIAGNOSIS — M25562 Pain in left knee: Secondary | ICD-10-CM

## 2018-03-16 DIAGNOSIS — Z96652 Presence of left artificial knee joint: Secondary | ICD-10-CM | POA: Diagnosis not present

## 2018-03-16 DIAGNOSIS — R269 Unspecified abnormalities of gait and mobility: Secondary | ICD-10-CM

## 2018-03-16 DIAGNOSIS — M25662 Stiffness of left knee, not elsewhere classified: Secondary | ICD-10-CM

## 2018-03-16 DIAGNOSIS — M6281 Muscle weakness (generalized): Secondary | ICD-10-CM

## 2018-03-16 NOTE — Therapy (Addendum)
[place] [facility] [address] [place], [place], [address] Phone: [phone]   Fax:  [phone]  Physical Therapy Treatment  Patient Details  Name: Tiffany Barber MRN: [id number] Date of Birth: 1940-11-13 Referring Provider (PT): Dr. Rozelle Logan   Encounter Date: 03/16/2018    Treatment: 11 of 17.  Recert date: 5/46/5681 1235 to 1327   Past Medical History:  Diagnosis Date  . 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 Hypertension     History reviewed. No pertinent surgical history.  There were no vitals filed for this visit.     Pt. states she is having a lot of pain in L hand today. Pt. states she has difficulty with commode at home due to being too low. PT discussed ADLs with pt. and her goals with bathing/ toileting.        Treatment:  Therex tx:  NuStep 10 min. L5 B UE/LE (warm-up)- pain in L hand.   Seated L knee flexion with pillow case to decrease friction 20x.  Seated LAQ 3# 2x12 B Seated high marches 3# 2x12 Supine heel slides/ bridging (see HEP) Fwd/bkwd walking in //-bars 4 laps (increase L hip hike) Side stepping //-bars 3 laps   Gait training:   Pt. Ambulates in clinic with use of RW and CGA to encourage increased step length, heel strike, hip/ knee flexion and upright posture. Poor L knee flexion/ heel strike but able to increase with moderate verbal cuing.  Pt. Easily fatiguedand requires several seated rest breaks.  PT reassessed gait pattern in hallway    PT Long Term Goals - 03/03/18 1538      PT LONG TERM GOAL #1   Title  Pt. will increase FOTO to 52 to improve functional mobility.      Baseline  Initial FOTO: 41    Time  4    Period  Weeks    Status  On-going    Target Date  03/31/18      PT LONG TERM GOAL #2   Title  Pt. will increase L knee AROM (0 to >100 deg.) to improve functional mobility.     Baseline  L knee AROM: 0 to 103 deg. (seated position).      Time  4     Period  Weeks    Status  Achieved    Target Date  03/03/18      PT LONG TERM GOAL #3   Title  Pt. able to safely transfer from chair to [place] with mod. I and proper technique to improve mobility.      Baseline  Mod. A x 1 to safely stand from chair.  PT has to stabilize RW and assist pt.     Time  4    Period  Weeks    Status  Not Met    Target Date  03/31/18      PT LONG TERM GOAL #4   Title  Pt. will ambulate with 100 feet with CGA and use of RW with consistent step pattern safely.      Baseline  Pt. ambulates with limited B hip/knee flexion and no heel strike.  Max ambulation 60 feet in hallway (fatigue).      Time  4    Period  Weeks    Status  Partially Met    Target Date  03/31/18        Moderate fatigue/ low back discomfort with prolonged standing  and gait training in clinic. Pt. requires motivation to push herself to increase gait distance/ consistent hip flexion/ step pattern. Pt. requires heavy use of B UE in //-bars or RW for safety/ mod. [place] with gait. Pt. continues to be limited with sit to stands secondary to pulling up, instead of pushing up from armrests (significant B shoulder limitations).       Patient will benefit from skilled therapeutic intervention in order to improve the following deficits and impairments:  Abnormal gait, Improper body mechanics, Pain, Decreased mobility, Decreased scar mobility, Decreased activity tolerance, Decreased endurance, Decreased range of motion, Decreased strength, Hypomobility, Decreased balance, Decreased safety awareness, Difficulty walking, Impaired flexibility  Visit Diagnosis: Status post left knee replacement  Joint stiffness of knee, left  Muscle weakness (generalized)  Gait difficulty  Chronic pain of left knee     Problem List Patient Active Problem List   Diagnosis Date Noted  . Hypertension 12/01/2014  . Degenerative arthritis 11/21/2014   Pura Spice, PT, DPT # [id number] 03/18/2018, 8:49  AM  [facility] [address] [place], [place], [address] Phone: [phone]   Fax:  [phone]  Name: Tiffany Barber MRN: [id number] Date of Birth: October 29, 1940

## 2018-03-16 NOTE — Patient Instructions (Signed)
Access Code: [email or web address]  URL: [email or web address]  Date: 03/16/2018  Prepared by: Dorene Grebe   Exercises  Beginner Bridge - 10 reps - 3 sets - 1x daily - 7x weekly  Supine Heel Slide - 10 reps - 3 sets - 1x daily - 7x weekly

## 2018-03-18 ENCOUNTER — Ambulatory Visit: Payer: Medicare Other | Admitting: Physical Therapy

## 2018-03-18 ENCOUNTER — Encounter: Payer: Self-pay | Admitting: Physical Therapy

## 2018-03-18 DIAGNOSIS — Z96652 Presence of left artificial knee joint: Secondary | ICD-10-CM | POA: Diagnosis not present

## 2018-03-18 DIAGNOSIS — M25662 Stiffness of left knee, not elsewhere classified: Secondary | ICD-10-CM

## 2018-03-18 DIAGNOSIS — M6281 Muscle weakness (generalized): Secondary | ICD-10-CM

## 2018-03-18 DIAGNOSIS — M25562 Pain in left knee: Secondary | ICD-10-CM

## 2018-03-18 DIAGNOSIS — G8929 Other chronic pain: Secondary | ICD-10-CM

## 2018-03-18 DIAGNOSIS — R269 Unspecified abnormalities of gait and mobility: Secondary | ICD-10-CM

## 2018-03-18 NOTE — Therapy (Addendum)
[place] [facility] [address] [place], [place], [address] Phone: [phone]   Fax:  [phone]  Physical Therapy Treatment  Patient Details  Name: Tiffany Barber MRN: [id number] Date of Birth: Jan 08, 1941 Referring Provider (PT): Dr. Rozelle Logan   Encounter Date: 03/18/2018  PT End of Session - 03/18/18 1315    Visit Number  12    Number of Visits  17    Date for PT Re-Evaluation  03/31/18    PT Start Time  9798    PT Stop Time  1403    PT Time Calculation (min)  68 min    Equipment Utilized During Treatment  Gait belt    Activity Tolerance  Patient tolerated treatment well;Patient limited by fatigue;Patient limited by pain    Behavior During Therapy  [facility] for tasks assessed/performed       Past Medical History:  Diagnosis Date  . 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 Hypertension     History reviewed. No pertinent surgical history.  There were no vitals filed for this visit.  Subjective Assessment - 03/18/18 1313    Subjective  Pt. states she took 2 pain pills this morning and still has a lot of pain in L hand.  Pt. arrived on time in w/c with transportation services.      Patient is accompained by:  Family member    Pertinent History  Pt. lives with son and daughter in [place].  Pt. s/p R TKA in 2017.  Pt. has primarily used a w/c over past several years due to L knee pain.  Pt. had several falls in 2017 resulting in significant B shoulder joint arthritis/ stiffness.  Pt. requires use of transport to go to MD visits secondary to w/c use.  Pt. does limited activities outside and has a CNA assist with bathing (in bed) and household tasks 6 hours/ day.  Pt. states she does exercises with her aide on a regular basis.  Pt. likes going to church, cooking and reading books.      Limitations  Standing;Walking;House hold activities    How long can you stand comfortably?  <2 min.     Patient Stated Goals  Increase B LE muscle  strength to improve standing/ walking.      Currently in Pain?  Yes    Pain Score  10-Worst pain ever    Pain Location  Hand    Pain Orientation  Left    Pain Descriptors / Indicators  Aching    Pain Type  Chronic pain           Treatment:  Therex tx:  NuStep 10 min. L5 B UE/LE (warm-up)- pain in L hand/ elbow (10/10 pain reported).   Fwd/bkwd/lateral walking in //-bars 4 laps (moderate cuing to promote increase hip flexion/ step pattern/ heel strike).   Seated 3#: marching/ LAQ/ heel raises/ toe raises 20x each (cuing for proper technique).  Seated adduction with ball squeeze 20x. Sit to stands with CGA on L UE and cuing to push up on R with use of armrest.  Pts. Significant B shoulder joint stiffness limit ability to use armrests and pt. Prefers pulling up on //-bars. Reviewed HEP/ importance of walking more frequently at home   Gait training:  Pt. Ambulates in clinic with use of RW and CGA to encourageincreasedstep length,heel strike, hip/ knee flexionand upright posture. Poor L knee flexion/ heel strike but able to increase with moderate  verbal cuing.  Pt. Easily fatiguedand requires several seated rest breaks.  L hand/elbow limit pts. Ability to wt. [place] on RW/ //-bars for several minutes and requires extra time today.   PT reviewed stair training/ step ups with pt.       PT Long Term Goals - 03/03/18 1538      PT LONG TERM GOAL #1   Title  Pt. will increase FOTO to 52 to improve functional mobility.      Baseline  Initial FOTO: 41    Time  4    Period  Weeks    Status  On-going    Target Date  03/31/18      PT LONG TERM GOAL #2   Title  Pt. will increase L knee AROM (0 to >100 deg.) to improve functional mobility.     Baseline  L knee AROM: 0 to 103 deg. (seated position).      Time  4    Period  Weeks    Status  Achieved    Target Date  03/03/18      PT LONG TERM GOAL #3   Title  Pt. able to safely transfer from chair to [place] with mod. I and proper  technique to improve mobility.      Baseline  Mod. A x 1 to safely stand from chair.  PT has to stabilize RW and assist pt.     Time  4    Period  Weeks    Status  Not Met    Target Date  03/31/18      PT LONG TERM GOAL #4   Title  Pt. will ambulate with 100 feet with CGA and use of RW with consistent step pattern safely.      Baseline  Pt. ambulates with limited B hip/knee flexion and no heel strike.  Max ambulation 60 feet in hallway (fatigue).      Time  4    Period  Weeks    Status  Partially Met    Target Date  03/31/18        Tx. progression with standing/walking tasks limited by high levels of pain in L hand/elbow. Pt. requested several seated rest breaks due to pain in L hand/elbow. Pt. also reports increase low back pain with upright posture. Tx. focus on LE muscle strengthening in seated posture with intermittent standing/gait training as tolerated. Pt. presents with L elbow muscle tightness/ tenderness with STM to proximal forearm extensors/ flexors.      Plan - 03/18/18 1316    Clinical Presentation  Evolving    Clinical Decision Making  Moderate    Rehab Potential  Fair    Clinical Impairments Affecting Rehab Potential  B shoulder joint stiffness    PT Frequency  2x / week    PT Duration  4 weeks    PT Treatment/Interventions  ADLs/Self Care Home Management;Cryotherapy;Moist Heat;Gait training;Stair training;Functional mobility training;Neuromuscular re-education;Balance training;Therapeutic exercise;Therapeutic activities;Patient/family education;Manual techniques;Wheelchair mobility training;Scar mobilization;Passive range of motion    PT Next Visit Plan  Continue B LE strengthening, ROM (manual) and gait endurance,     PT Home Exercise Plan  see handouts.        Patient will benefit from skilled therapeutic intervention in order to improve the following deficits and impairments:  Abnormal gait, Improper body mechanics, Pain, Decreased mobility, Decreased scar  mobility, Decreased activity tolerance, Decreased endurance, Decreased range of motion, Decreased strength, Hypomobility, Decreased balance, Decreased safety awareness, Difficulty walking, Impaired flexibility  Visit Diagnosis: Status post left knee replacement  Joint stiffness of knee, left  Muscle weakness (generalized)  Gait difficulty  Chronic pain of left knee     Problem List Patient Active Problem List   Diagnosis Date Noted  . Hypertension 12/01/2014  . Degenerative arthritis 11/21/2014   Pura Spice, PT, DPT # [id number] 03/18/2018, 5:29 PM  [place] [facility] [address] [place], [place], [address] Phone: [phone]   Fax:  [phone]  Name: Jullie Arps MRN: [id number] Date of Birth: May 30, 1940

## 2018-03-21 ENCOUNTER — Ambulatory Visit: Payer: Medicare Other | Attending: Orthopedic Surgery | Admitting: Physical Therapy

## 2018-03-21 ENCOUNTER — Encounter: Payer: Self-pay | Admitting: Physical Therapy

## 2018-03-21 DIAGNOSIS — M25662 Stiffness of left knee, not elsewhere classified: Secondary | ICD-10-CM | POA: Insufficient documentation

## 2018-03-21 DIAGNOSIS — M6281 Muscle weakness (generalized): Secondary | ICD-10-CM | POA: Diagnosis present

## 2018-03-21 DIAGNOSIS — Z96652 Presence of left artificial knee joint: Secondary | ICD-10-CM | POA: Diagnosis present

## 2018-03-21 DIAGNOSIS — R269 Unspecified abnormalities of gait and mobility: Secondary | ICD-10-CM | POA: Diagnosis present

## 2018-03-21 DIAGNOSIS — G8929 Other chronic pain: Secondary | ICD-10-CM | POA: Insufficient documentation

## 2018-03-21 DIAGNOSIS — M25562 Pain in left knee: Secondary | ICD-10-CM | POA: Diagnosis present

## 2018-03-21 NOTE — Therapy (Signed)
[facility] [address] [place], [place], [address] Phone: [phone]   Fax:  [phone]  Physical Therapy Treatment  Patient Details  Name: Tiffany Barber MRN: [id number] Date of Birth: Jul 10, 1940 Referring Provider (PT): Dr. Rozelle Logan   Encounter Date: 03/21/2018  PT End of Session - 03/21/18 1304    Visit Number  13    Number of Visits  17    Date for PT Re-Evaluation  03/31/18    PT Start Time  1254    PT Stop Time  1342    PT Time Calculation (min)  48 min    Equipment Utilized During Treatment  Gait belt    Activity Tolerance  Patient tolerated treatment well;Patient limited by fatigue;Patient limited by pain    Behavior During Therapy  [facility] for tasks assessed/performed       Past Medical History:  Diagnosis Date  . 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 Hypertension     History reviewed. No pertinent surgical history.  There were no vitals filed for this visit.  Subjective Assessment - 03/21/18 1302    Subjective  Patient reports that she has "not too bad" pain in the L knee. Patient states that she was walking around the house this weekend and did some seated exercises.     Patient is accompained by:  Family member    Pertinent History  Pt. lives with son and daughter in [place].  Pt. s/p R TKA in 2017.  Pt. has primarily used a w/c over past several years due to L knee pain.  Pt. had several falls in 2017 resulting in significant B shoulder joint arthritis/ stiffness.  Pt. requires use of transport to go to MD visits secondary to w/c use.  Pt. does limited activities outside and has a CNA assist with bathing (in bed) and household tasks 6 hours/ day.  Pt. states she does exercises with her aide on a regular basis.  Pt. likes going to church, cooking and reading books.      Limitations  Standing;Walking;House hold activities    How long can you stand comfortably?  <2 min.     Patient Stated Goals  Increase  B LE muscle strength to improve standing/ walking.      Currently in Pain?  No/denies       TREATMENT Therapeutic Exercise: Fwd/bkwd/lateral walking in //-bars 3 laps(VC for increased hip flexion/ step pattern/ heel strike).   Fwd/bkwd/lateral walking in //-bars 3 laps, 4# CW. (VC for increased hip flexion/ step pattern/ heel strike).   Seated 4#: marching/ LAQ/ heel raises/ toe raises 2x10 each. VCs for pace of movement to maximize recruitment of muscle fibers.  Seated hip adduction with ball squeeze 20x. Seated hip abduction, GTB, 20x Pt. Ambulated inclinicwith use of RW and CGA. Patient articulated importance of hip flexion and attempted to maintain throughout, but has decreased ROM as fatigue sets in.   Treatments unbilled: NuStep 10 min. L4 B UE/LE (warm-up) Patient reported muscle cramping in the distal quad. (hx taken, 8 min unbilled) Seat adjusted for improved knee flexion at 4 min.   Patient Response to interventions: Patient thoroughly enjoyed walking with cuff weights on BLE. She reported "This is really good. Excellent. I feel it working my muscles." Patient continues to require multiple seated rest breaks 2/2 to fatigue.  PT Education - 03/22/18 1408    Education Details  exercise technique, importance of walking in the  home and reinforced HEP    Person(s) Educated  Patient    Methods  Explanation;Demonstration    Comprehension  Verbalized understanding;Need further instruction;Returned demonstration         PT Long Term Goals - 03/03/18 1538      PT LONG TERM GOAL #1   Title  Pt. will increase FOTO to 52 to improve functional mobility.      Baseline  Initial FOTO: 41    Time  4    Period  Weeks    Status  On-going    Target Date  03/31/18      PT LONG TERM GOAL #2   Title  Pt. will increase L knee AROM (0 to >100 deg.) to improve functional mobility.     Baseline  L knee AROM: 0 to 103 deg. (seated position).      Time  4    Period  Weeks    Status   Achieved    Target Date  03/03/18      PT LONG TERM GOAL #3   Title  Pt. able to safely transfer from chair to [place] with mod. I and proper technique to improve mobility.      Baseline  Mod. A x 1 to safely stand from chair.  PT has to stabilize RW and assist pt.     Time  4    Period  Weeks    Status  Not Met    Target Date  03/31/18      PT LONG TERM GOAL #4   Title  Pt. will ambulate with 100 feet with CGA and use of RW with consistent step pattern safely.      Baseline  Pt. ambulates with limited B hip/knee flexion and no heel strike.  Max ambulation 60 feet in hallway (fatigue).      Time  4    Period  Weeks    Status  Partially Met    Target Date  03/31/18            Plan - 03/22/18 1416    Clinical Impression Statement  Patient presents to clinic with excellent motivation to participate in therapy. Patient demonstrates deficits in ROM, strength, and mobility as evidenced by her continued reliance on UE support during ambulation and transfers, limited knee and hip flexion during ambulation, and decreased foot clearance during gait. Patient able to achieve 4 laps of ambulation, retro-ambulation, and lateral walking in the parallel bars with the addition of 4# cuff weights on BLE during today's session and responded positively to the additional weight, achieving consistent foot clearance. Patient will benefit from continued skilled therapeutic intervention to address remaining deficits in strength, mobility, and activity tolerance  in order to maintain/improve level of independence, increase function, and improve overall QOL.   Rehab Potential  Fair    Clinical Impairments Affecting Rehab Potential  B shoulder joint stiffness    PT Frequency  2x / week    PT Duration  4 weeks    PT Treatment/Interventions  ADLs/Self Care Home Management;Cryotherapy;Moist Heat;Gait training;Stair training;Functional mobility training;Neuromuscular re-education;Balance training;Therapeutic  exercise;Therapeutic activities;Patient/family education;Manual techniques;Wheelchair mobility training;Scar mobilization;Passive range of motion    PT Next Visit Plan  Continue B LE strengthening, ROM (manual) and gait endurance,     PT Home Exercise Plan  see handouts.        Patient will benefit from skilled therapeutic intervention in order to improve the following deficits and impairments:  Abnormal gait, Improper body mechanics,  Pain, Decreased mobility, Decreased scar mobility, Decreased activity tolerance, Decreased endurance, Decreased range of motion, Decreased strength, Hypomobility, Decreased balance, Decreased safety awareness, Difficulty walking, Impaired flexibility  Visit Diagnosis: Status post left knee replacement  Joint stiffness of knee, left  Muscle weakness (generalized)  Gait difficulty  Chronic pain of left knee     Problem List Patient Active Problem List   Diagnosis Date Noted  . Hypertension 12/01/2014  . Degenerative arthritis 11/21/2014   Myles Gip PT, DPT [phone] 03/22/2018, 2:16 PM  [facility] [address] [place], [place], [address] Phone: [phone]   Fax:  [phone]  Name: Tiffany Barber MRN: [id number] Date of Birth: [birth year]

## 2018-03-23 ENCOUNTER — Ambulatory Visit: Payer: Medicare Other | Admitting: Physical Therapy

## 2018-03-23 ENCOUNTER — Encounter: Payer: Self-pay | Admitting: Physical Therapy

## 2018-03-23 DIAGNOSIS — G8929 Other chronic pain: Secondary | ICD-10-CM

## 2018-03-23 DIAGNOSIS — Z96652 Presence of left artificial knee joint: Secondary | ICD-10-CM | POA: Diagnosis not present

## 2018-03-23 DIAGNOSIS — M25662 Stiffness of left knee, not elsewhere classified: Secondary | ICD-10-CM

## 2018-03-23 DIAGNOSIS — M25562 Pain in left knee: Secondary | ICD-10-CM

## 2018-03-23 DIAGNOSIS — R269 Unspecified abnormalities of gait and mobility: Secondary | ICD-10-CM

## 2018-03-23 DIAGNOSIS — M6281 Muscle weakness (generalized): Secondary | ICD-10-CM

## 2018-03-23 NOTE — Therapy (Signed)
[facility] [address] [place], [place], [address] Phone: [phone]   Fax:  [phone]  Physical Therapy Treatment  Patient Details  Name: Tiffany Barber MRN: [id number] Date of Birth: 08/04/1940 Referring Provider (PT): Dr. Rozelle Logan   Encounter Date: 03/23/2018  PT End of Session - 03/23/18 1303    Visit Number  14    Number of Visits  17    Date for PT Re-Evaluation  03/31/18    PT Start Time  3354    PT Stop Time  1339    PT Time Calculation (min)  58 min    Equipment Utilized During Treatment  Gait belt    Activity Tolerance  Patient tolerated treatment well;Patient limited by fatigue;Patient limited by pain    Behavior During Therapy  [facility] for tasks assessed/performed       Past Medical History:  Diagnosis Date  . 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 Hypertension     History reviewed. No pertinent surgical history.  There were no vitals filed for this visit.  Subjective Assessment - 03/23/18 1253    Subjective  Pt. reports L knee has been "ok" but some lateral pain. Pt. states her L arm is still painful and was able to walk the past few days in the house/ on the deck.    Patient is accompained by:  Family member    Pertinent History  Pt. lives with son and daughter in [place].  Pt. s/p R TKA in 2017.  Pt. has primarily used a w/c over past several years due to L knee pain.  Pt. had several falls in 2017 resulting in significant B shoulder joint arthritis/ stiffness.  Pt. requires use of transport to go to MD visits secondary to w/c use.  Pt. does limited activities outside and has a CNA assist with bathing (in bed) and household tasks 6 hours/ day.  Pt. states she does exercises with her aide on a regular basis.  Pt. likes going to church, cooking and reading books.      Limitations  Standing;Walking;House hold activities    How long can you stand comfortably?  <2 min.     Patient Stated Goals  Increase B  LE muscle strength to improve standing/ walking.      Currently in Pain?  Yes    Pain Score  8     Pain Location  Arm    Pain Orientation  Left        TREATMENT  Therapeutic Exercise:  NuStep 10 min L5 (no charge) Fwd/bkwd/lateralwalking in //-bars 3 laps(VC for increased hip flexion/ step pattern/ heel strike). Seated 3#: marching/ LAQ/  (VCs for pace of movement to maximize recruitment of muscle fibers). Seated hip adduction with ball squeeze 20x. Seated hip abduction, GTB, 20x Seated B HS curls 1x15 each Pt. Ambulated inclinicwith use of RW and CGA. Patient articulated importance of hip flexion/ heel strike and attempted to maintain throughout, but has decreased ROM as fatigue sets in.     PT Long Term Goals - 03/03/18 1538      PT LONG TERM GOAL #1   Title  Pt. will increase FOTO to 52 to improve functional mobility.      Baseline  Initial FOTO: 41    Time  4    Period  Weeks    Status  On-going    Target Date  03/31/18      PT  LONG TERM GOAL #2   Title  Pt. will increase L knee AROM (0 to >100 deg.) to improve functional mobility.     Baseline  L knee AROM: 0 to 103 deg. (seated position).      Time  4    Period  Weeks    Status  Achieved    Target Date  03/03/18      PT LONG TERM GOAL #3   Title  Pt. able to safely transfer from chair to [place] with mod. I and proper technique to improve mobility.      Baseline  Mod. A x 1 to safely stand from chair.  PT has to stabilize RW and assist pt.     Time  4    Period  Weeks    Status  Not Met    Target Date  03/31/18      PT LONG TERM GOAL #4   Title  Pt. will ambulate with 100 feet with CGA and use of RW with consistent step pattern safely.      Baseline  Pt. ambulates with limited B hip/knee flexion and no heel strike.  Max ambulation 60 feet in hallway (fatigue).      Time  4    Period  Weeks    Status  Partially Met    Target Date  03/31/18         Plan - 03/23/18 1303    Clinical Impression  Statement  Pt. presents to clinic in w/c and was able to ambulate 2 laps in the clinic with 1 rests in between. Pt. ambulates with RW and wide BOS with decr. hip/ knee flexion and heel strike. Pt. needs constant verbal cues throughout therex to maintain proper gait mechanics but pt. is unable to achieve 2/2 to LE fatigue and c/o of L elbow pain. Pt. is unable to consistently stand up/ sit down without pulling on walker and PT. gives constant verbal cues and education about the importance of not pulling on the walker when transitioning. Pt. will continue to benefit from skilled PT services in order to improve B LE strength/ ROM to normalize gait pattern and be able to safely complete ADLs and ambulation with RW without fatigue/ pain.    Clinical Presentation  Evolving    Clinical Decision Making  Moderate    Rehab Potential  Fair    Clinical Impairments Affecting Rehab Potential  B shoulder joint stiffness    PT Frequency  2x / week    PT Duration  4 weeks    PT Treatment/Interventions  ADLs/Self Care Home Management;Cryotherapy;Moist Heat;Gait training;Stair training;Functional mobility training;Neuromuscular re-education;Balance training;Therapeutic exercise;Therapeutic activities;Patient/family education;Manual techniques;Wheelchair mobility training;Scar mobilization;Passive range of motion    PT Next Visit Plan  Continue B LE strengthening, ROM (manual) and gait endurance. Recert date is 5/68    PT Home Exercise Plan  see handouts.     Consulted and Agree with Plan of Care  Patient       Patient will benefit from skilled therapeutic intervention in order to improve the following deficits and impairments:  Abnormal gait, Improper body mechanics, Pain, Decreased mobility, Decreased scar mobility, Decreased activity tolerance, Decreased endurance, Decreased range of motion, Decreased strength, Hypomobility, Decreased balance, Decreased safety awareness, Difficulty walking, Impaired  flexibility  Visit Diagnosis: Status post left knee replacement  Joint stiffness of knee, left  Muscle weakness (generalized)  Gait difficulty  Chronic pain of left knee     Problem List Patient Active Problem List  Diagnosis Date Noted  . Hypertension 12/01/2014  . Degenerative arthritis 11/21/2014   Pura Spice, PT, DPT # [id number] FEETOL NTJXKA JJAAZ, [place] 03/23/2018, 1:58 PM  [facility] [address] [place] [place], [place], [address] Phone: [phone]   Fax:  [phone]  Name: Tiffany Barber MRN: [id number] Date of Birth: 05-22-1940

## 2018-03-28 ENCOUNTER — Ambulatory Visit: Payer: Medicare Other | Admitting: Physical Therapy

## 2018-03-28 ENCOUNTER — Encounter: Payer: Self-pay | Admitting: Physical Therapy

## 2018-03-28 DIAGNOSIS — Z96652 Presence of left artificial knee joint: Secondary | ICD-10-CM

## 2018-03-28 DIAGNOSIS — R269 Unspecified abnormalities of gait and mobility: Secondary | ICD-10-CM

## 2018-03-28 DIAGNOSIS — M6281 Muscle weakness (generalized): Secondary | ICD-10-CM

## 2018-03-28 DIAGNOSIS — M25662 Stiffness of left knee, not elsewhere classified: Secondary | ICD-10-CM

## 2018-03-28 DIAGNOSIS — M25562 Pain in left knee: Secondary | ICD-10-CM

## 2018-03-28 DIAGNOSIS — G8929 Other chronic pain: Secondary | ICD-10-CM

## 2018-03-28 NOTE — Therapy (Deleted)
[facility] [address] [place], [place], [address] Phone: [phone]   Fax:  [phone]  Physical Therapy Treatment  Patient Details  Name: Tiffany Barber MRN: [id number] Date of Birth: Dec 27, 1940 Referring Provider (PT): Dr. Rozelle Logan   Encounter Date: 03/28/2018  PT End of Session - 03/28/18 1252    Visit Number  15    Number of Visits  17    Date for PT Re-Evaluation  03/31/18    PT Start Time  9323    PT Stop Time  1338    PT Time Calculation (min)  56 min    Equipment Utilized During Treatment  Gait belt    Activity Tolerance  Patient tolerated treatment well;Patient limited by fatigue;Patient limited by pain    Behavior During Therapy  [facility] for tasks assessed/performed       Past Medical History:  Diagnosis Date  . 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 Hypertension     History reviewed. No pertinent surgical history.  There were no vitals filed for this visit.  Subjective Assessment - 03/28/18 1242    Subjective  Pt. reports L elbow pain still and will schedule MD appointment soon. Pt. reports walking over the weekend and knees are "not too bad."    Patient is accompained by:  Family member    Pertinent History  Pt. lives with son and daughter in [place].  Pt. s/p R TKA in 2017.  Pt. has primarily used a w/c over past several years due to L knee pain.  Pt. had several falls in 2017 resulting in significant B shoulder joint arthritis/ stiffness.  Pt. requires use of transport to go to MD visits secondary to w/c use.  Pt. does limited activities outside and has a CNA assist with bathing (in bed) and household tasks 6 hours/ day.  Pt. states she does exercises with her aide on a regular basis.  Pt. likes going to church, cooking and reading books.      Limitations  Standing;Walking;House hold activities    How long can you stand comfortably?  <2 min.     Patient Stated Goals  Increase B LE muscle strength to  improve standing/ walking.      Currently in Pain?  Yes    Pain Score  8     Pain Location  Arm    Pain Orientation  Left    Pain Descriptors / Indicators  Aching    Pain Onset  More than a month ago         TREATMENT  Therapeutic Exercise:  Fwd/bkwd/lateralwalking in //-bars2laps 3#(VC for increasedhip flexion/ step pattern/ heel strike). Seated3#: Air traffic controller LAQ/  (VCs for pace of movement to maximize recruitment of muscle fibers). Seatedhipadduction with ball squeeze 20x and LAQ with ball squeeze 10x B Seated B HS curls GTB 1x15 each Standing stair taps to 3 inch stair 15x each Standing narrow BOS balance in //-bars (fatigue noted) NuStep 10 min L5 (no charge)  Pt. Ambulatedinclinicwith use of RW and CGA. Patient articulated importance of hip flexion/ heel strike and attempted to maintain throughout, but has decreased ROM as fatigue sets in.   PT Long Term Goals - 03/03/18 1538      PT LONG TERM GOAL #1   Title  Pt. will increase FOTO to 52 to improve functional mobility.      Baseline  Initial FOTO: 41    Time  4  Period  Weeks    Status  On-going    Target Date  03/31/18      PT LONG TERM GOAL #2   Title  Pt. will increase L knee AROM (0 to >100 deg.) to improve functional mobility.     Baseline  L knee AROM: 0 to 103 deg. (seated position).      Time  4    Period  Weeks    Status  Achieved    Target Date  03/03/18      PT LONG TERM GOAL #3   Title  Pt. able to safely transfer from chair to [place] with mod. I and proper technique to improve mobility.      Baseline  Mod. A x 1 to safely stand from chair.  PT has to stabilize RW and assist pt.     Time  4    Period  Weeks    Status  Not Met    Target Date  03/31/18      PT LONG TERM GOAL #4   Title  Pt. will ambulate with 100 feet with CGA and use of RW with consistent step pattern safely.      Baseline  Pt. ambulates with limited B hip/knee flexion and no heel strike.  Max ambulation 60 feet in  hallway (fatigue).      Time  4    Period  Weeks    Status  Partially Met    Target Date  03/31/18            Plan - 03/28/18 1328    Clinical Presentation  Evolving    Clinical Decision Making  Moderate    Rehab Potential  Fair    Clinical Impairments Affecting Rehab Potential  B shoulder joint stiffness    PT Frequency  2x / week    PT Duration  4 weeks    PT Treatment/Interventions  ADLs/Self Care Home Management;Cryotherapy;Moist Heat;Gait training;Stair training;Functional mobility training;Neuromuscular re-education;Balance training;Therapeutic exercise;Therapeutic activities;Patient/family education;Manual techniques;Wheelchair mobility training;Scar mobilization;Passive range of motion    PT Next Visit Plan  Gait training, balance, manual therapy. Recert 1/12 no appts scheduled after this week    PT Home Exercise Plan  see handouts.     Consulted and Agree with Plan of Care  Patient       Patient will benefit from skilled therapeutic intervention in order to improve the following deficits and impairments:  Abnormal gait, Improper body mechanics, Pain, Decreased mobility, Decreased scar mobility, Decreased activity tolerance, Decreased endurance, Decreased range of motion, Decreased strength, Hypomobility, Decreased balance, Decreased safety awareness, Difficulty walking, Impaired flexibility  Visit Diagnosis: Status post left knee replacement  Joint stiffness of knee, left  Muscle weakness (generalized)  Gait difficulty  Chronic pain of left knee     Problem List Patient Active Problem List   Diagnosis Date Noted  . Hypertension 12/01/2014  . Degenerative arthritis 11/21/2014    Pura Spice 03/28/2018, 1:32 PM  [facility] [address] [place], [place], [address] Phone: [phone]   Fax:  [phone]  Name: Tiffany Barber MRN: [id number] Date of Birth: [birth year]

## 2018-03-28 NOTE — Therapy (Signed)
[facility] [address] [place], [place], [address] Phone: [phone]   Fax:  [phone]  Physical Therapy Treatment  Patient Details  Name: Tiffany Barber MRN: [id number] Date of Birth: 06/17/1940 Referring Provider (PT): Dr. Rozelle Logan   Encounter Date: 03/28/2018  PT End of Session - 03/28/18 1252    Visit Number  15    Number of Visits  17    Date for PT Re-Evaluation  03/31/18    PT Start Time  9924    PT Stop Time  1338    PT Time Calculation (min)  56 min    Equipment Utilized During Treatment  Gait belt    Activity Tolerance  Patient tolerated treatment well;Patient limited by fatigue;Patient limited by pain    Behavior During Therapy  [facility] for tasks assessed/performed       Past Medical History:  Diagnosis Date  . 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 Hypertension     History reviewed. No pertinent surgical history.  There were no vitals filed for this visit.  Subjective Assessment - 03/28/18 1242    Subjective  Pt. reports L elbow pain still and will schedule MD appointment soon. Pt. reports walking over the weekend and knees are "not too bad."    Patient is accompained by:  Family member    Pertinent History  Pt. lives with son and daughter in [place].  Pt. s/p R TKA in 2017.  Pt. has primarily used a w/c over past several years due to L knee pain.  Pt. had several falls in 2017 resulting in significant B shoulder joint arthritis/ stiffness.  Pt. requires use of transport to go to MD visits secondary to w/c use.  Pt. does limited activities outside and has a CNA assist with bathing (in bed) and household tasks 6 hours/ day.  Pt. states she does exercises with her aide on a regular basis.  Pt. likes going to church, cooking and reading books.      Limitations  Standing;Walking;House hold activities    How long can you stand comfortably?  <2 min.     Patient Stated Goals  Increase B LE muscle strength to  improve standing/ walking.      Currently in Pain?  Yes    Pain Score  8     Pain Location  Arm    Pain Orientation  Left    Pain Descriptors / Indicators  Aching    Pain Onset  More than a month ago         TREATMENT  Therapeutic Exercise:  Fwd/bkwd/lateralwalking in //-bars2laps 3#(VC for increasedhip flexion/ step pattern/ heel strike). Seated3#: Air traffic controller LAQ/  (VCs for pace of movement to maximize recruitment of muscle fibers). Seatedhipadduction with ball squeeze 20x and LAQ with ball squeeze 10x B Seated B HS curls GTB 1x15 each Standing stair taps to 3 inch stair 15x each Standing narrow BOS balance in //-bars 2x15 sec (fatigue noted) NuStep 10 min L6 (no charge) Pt. Ambulatedinclinicwith use of RW and CGA. Patient articulated importance of hip flexion/ heel strike and attempted to maintain throughout, but has decreased ROM as fatigue sets in.   PT Long Term Goals - 03/03/18 1538      PT LONG TERM GOAL #1   Title  Pt. will increase FOTO to 52 to improve functional mobility.      Baseline  Initial FOTO: 41    Time  4  Period  Weeks    Status  On-going    Target Date  03/31/18      PT LONG TERM GOAL #2   Title  Pt. will increase L knee AROM (0 to >100 deg.) to improve functional mobility.     Baseline  L knee AROM: 0 to 103 deg. (seated position).      Time  4    Period  Weeks    Status  Achieved    Target Date  03/03/18      PT LONG TERM GOAL #3   Title  Pt. able to safely transfer from chair to [place] with mod. I and proper technique to improve mobility.      Baseline  Mod. A x 1 to safely stand from chair.  PT has to stabilize RW and assist pt.     Time  4    Period  Weeks    Status  Not Met    Target Date  03/31/18      PT LONG TERM GOAL #4   Title  Pt. will ambulate with 100 feet with CGA and use of RW with consistent step pattern safely.      Baseline  Pt. ambulates with limited B hip/knee flexion and no heel strike.  Max ambulation 60  feet in hallway (fatigue).      Time  4    Period  Weeks    Status  Partially Met    Target Date  03/31/18          Plan - 03/28/18 1328    Clinical Impression Statement  Pt. needed mod. VCs throughout tx in order to stand up from chair without pulling on RW. Pt. ambulates with wide BOS and and B LE ER and needs mod. VCs during ambulation to keep LE in less ER and incr. heel strike and knee flexion. Pt. was unable to put feet together for narrow BOS balance exercise 2/2 fatigue and wanted to keep B LE in ER. Pt. demonstrated decr. gait and strength endurance throughout the session today 2/2 L elbow pain and LE fatigue. Pt. will continue to benefit from skilled PT services in order to improve LE strength/ ROM/ endurance and balance in order to ambulate and transition safely with RW and w/c.    Clinical Presentation  Evolving    Clinical Decision Making  Moderate    Rehab Potential  Fair    Clinical Impairments Affecting Rehab Potential  B shoulder joint stiffness    PT Frequency  2x / week    PT Duration  4 weeks    PT Treatment/Interventions  ADLs/Self Care Home Management;Cryotherapy;Moist Heat;Gait training;Stair training;Functional mobility training;Neuromuscular re-education;Balance training;Therapeutic exercise;Therapeutic activities;Patient/family education;Manual techniques;Wheelchair mobility training;Scar mobilization;Passive range of motion    PT Next Visit Plan  Gait training, balance, manual therapy. Recert 9/32 no appts scheduled after this week    PT Home Exercise Plan  see handouts.     Consulted and Agree with Plan of Care  Patient       Patient will benefit from skilled therapeutic intervention in order to improve the following deficits and impairments:  Abnormal gait, Improper body mechanics, Pain, Decreased mobility, Decreased scar mobility, Decreased activity tolerance, Decreased endurance, Decreased range of motion, Decreased strength, Hypomobility, Decreased balance,  Decreased safety awareness, Difficulty walking, Impaired flexibility  Visit Diagnosis: Status post left knee replacement  Joint stiffness of knee, left  Muscle weakness (generalized)  Gait difficulty  Chronic pain of left knee  Problem List Patient Active Problem List   Diagnosis Date Noted  . Hypertension 12/01/2014  . Degenerative arthritis 11/21/2014   Pura Spice, PT, DPT # [id number] BTVMT, [place] 03/28/2018, 1:59 PM  [facility] [address] [place], [place], [address] Phone: [phone]   Fax:  [phone]  Name: Tiffany Barber MRN: [id number] Date of Birth: June 01, 1940

## 2018-03-30 ENCOUNTER — Ambulatory Visit: Payer: Medicare Other | Admitting: Physical Therapy

## 2018-04-05 ENCOUNTER — Ambulatory Visit: Payer: Medicare Other | Admitting: Physical Therapy

## 2018-04-05 ENCOUNTER — Encounter: Payer: Self-pay | Admitting: Physical Therapy

## 2018-04-05 DIAGNOSIS — M25662 Stiffness of left knee, not elsewhere classified: Secondary | ICD-10-CM

## 2018-04-05 DIAGNOSIS — G8929 Other chronic pain: Secondary | ICD-10-CM

## 2018-04-05 DIAGNOSIS — Z96652 Presence of left artificial knee joint: Secondary | ICD-10-CM | POA: Diagnosis not present

## 2018-04-05 DIAGNOSIS — R269 Unspecified abnormalities of gait and mobility: Secondary | ICD-10-CM

## 2018-04-05 DIAGNOSIS — M6281 Muscle weakness (generalized): Secondary | ICD-10-CM

## 2018-04-05 DIAGNOSIS — M25562 Pain in left knee: Secondary | ICD-10-CM

## 2018-04-05 NOTE — Therapy (Signed)
[facility] [address] [place], [place], [address] Phone: [phone]   Fax:  [phone]  Physical Therapy Treatment  Patient Details  Name: Tiffany Barber MRN: [id number] Date of Birth: 02-09-1941 Referring Provider (PT): Dr. Rozelle Logan   Encounter Date: 04/05/2018  PT End of Session - 04/05/18 1537    Visit Number  16    Number of Visits  24    Date for PT Re-Evaluation  05/03/18    PT Start Time  1329    PT Stop Time  1431    PT Time Calculation (min)  62 min    Equipment Utilized During Treatment  Gait belt    Activity Tolerance  Patient tolerated treatment well;Patient limited by fatigue;Patient limited by pain    Behavior During Therapy  [facility] for tasks assessed/performed       Past Medical History:  Diagnosis Date  . 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 Hypertension     History reviewed. No pertinent surgical history.  There were no vitals filed for this visit.  Subjective Assessment - 04/05/18 1537    Subjective  Pt. states she was sick last Thursday and missed her PT appt. Pt. reports her knees are "not bad" and states she has been walking at home and doing HEP. Pt. still c/o of L elbow pain "in the joint" and will be making MD appt soon.    Patient is accompained by:  Family member    Pertinent History  Pt. lives with son and daughter in [place].  Pt. s/p R TKA in 2017.  Pt. has primarily used a w/c over past several years due to L knee pain.  Pt. had several falls in 2017 resulting in significant B shoulder joint arthritis/ stiffness.  Pt. requires use of transport to go to MD visits secondary to w/c use.  Pt. does limited activities outside and has a CNA assist with bathing (in bed) and household tasks 6 hours/ day.  Pt. states she does exercises with her aide on a regular basis.  Pt. likes going to church, cooking and reading books.      Limitations  Standing;Walking;House hold activities    How long  can you stand comfortably?  <2 min.     Patient Stated Goals  Increase B LE muscle strength to improve standing/ walking.      Currently in Pain?  Yes    Pain Score  6     Pain Location  Arm    Pain Orientation  Left    Pain Descriptors / Indicators  Aching    Pain Type  Chronic pain         TREATMENT  Therapeutic Exercise:  Fwd/bkwd/high marchingwalking in //-bars3laps (VC for increasedhip flexion/ step pattern/ heel strike). Reassessment of B LE MMT (R LE grossly 4+/5, L LE grossly 4-/5 with pain during hip flex, knee ext. And DF MMT) Sit to stand education/ goal reassessment (education on importance of safely pushing up from chair and not pulling on walker) Standing cone stacking at work station 6 times with 1 rest break needed (to challenge balance, weight shift and UE reaching) Ambulation with RW in clinic/ hallway for ~75 feet  NuStep 10 min L6 (no charge) Pt. Ambulatedinclinicwith use of RW and CGA. Patient educated importance of hip flexion/ heel strikeand attempted to maintain throughout, but has decreased ROM as fatigue sets in.   Reviewed goals/ HEP   PT  Long Term Goals - 04/05/18 1538      PT LONG TERM GOAL #1   Title  Pt. will increase FOTO to 52 to improve functional mobility.      Baseline  Initial FOTO: 41 2/18: 24 Pt. had difficulty answering questions and needed assistance from PT.    Time  4    Period  Weeks    Status  Not Met    Target Date  05/03/18      PT LONG TERM GOAL #2   Title  Pt. will increase L knee AROM (0 to >100 deg.) to improve functional mobility.     Baseline  L knee AROM: 0 to 103 deg. (seated position).      Time  4    Period  Weeks    Status  Achieved    Target Date  03/03/18      PT LONG TERM GOAL #3   Title  Pt. able to safely transfer from chair to [place] with mod. I and proper technique to improve mobility.      Baseline  Pt. was able to stand from chair with min. CGA and proper technique using chair/ walker to stand  safely.    Time  4    Period  Weeks    Status  Achieved    Target Date  04/05/18      PT LONG TERM GOAL #4   Title  Pt. will ambulate with 100 feet with CGA and use of RW with consistent step pattern safely.      Baseline  Pt. was able to ambulate 75 feet and requested rest break immediately but pt. ambulates still with limited B hip/knee flexion and no heel strike.      Time  4    Period  Weeks    Status  Partially Met    Target Date  05/03/18            Plan - 04/05/18 1543    Clinical Impression Statement  Pt. needs mod VCs when ambulating in order to incr. step length, heel strike and hip flexion. Pt. was able to safely stand from chair without pulling on walker or //-bars but needs cueing in order to initiate pushing with UE from chair. Pt. was able to push up with both UEs or using one UE on walker and one on chair. PT educated pt. on importance of carrying this technique to her home and pt. states that she "does what we say in therapy." Pt. 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 Pt. still continues to be limited by decr. ROM in B UE and is only able to use R UE up to 90 degrees shld flexion for functional activities. Pt. was able to ambulate in hallway ~100 feet but continues to have decr. step length, heel strike and hip flexion. Pt. demonstrates the need for encouragement from PT to continue ambulating before pt. requests rest break. Pt. will continue to benefit from skilled PT services in order to incr. B LE ROM/strength and improve balance and gait pattern in order to have pain-free ADLs and safe ambulation.    Clinical Presentation  Evolving    Clinical Decision Making  Moderate    Rehab Potential  Fair    Clinical Impairments Affecting Rehab Potential  B shoulder joint stiffness    PT Frequency  2x / week    PT Duration  4 weeks  PT Treatment/Interventions  ADLs/Self Care Home Management;Cryotherapy;Moist  Heat;Gait training;Stair training;Functional mobility training;Neuromuscular re-education;Balance training;Therapeutic exercise;Therapeutic activities;Patient/family education;Manual techniques;Wheelchair mobility training;Scar mobilization;Passive range of motion    PT Next Visit Plan  Gait training, balance, manual therapy    PT Home Exercise Plan  see handouts.     Consulted and Agree with Plan of Care  Patient       Patient will benefit from skilled therapeutic intervention in order to improve the following deficits and impairments:  Abnormal gait, Improper body mechanics, Pain, Decreased mobility, Decreased scar mobility, Decreased activity tolerance, Decreased endurance, Decreased range of motion, Decreased strength, Hypomobility, Decreased balance, Decreased safety awareness, Difficulty walking, Impaired flexibility  Visit Diagnosis: Status post left knee replacement  Joint stiffness of knee, left  Muscle weakness (generalized)  Gait difficulty  Chronic pain of left knee     Problem List Patient Active Problem List   Diagnosis Date Noted  . Hypertension 12/01/2014  . Degenerative arthritis 11/21/2014   Pura Spice, PT, DPT # [id number] BHEBB, [place] 04/05/2018, 4:43 PM  [facility] [address] [place], [place], [address] Phone: [phone]   Fax:  [phone]  Name: Tiffany Barber MRN: [id number] Date of Birth: 1940/11/21

## 2018-04-07 ENCOUNTER — Ambulatory Visit: Payer: Medicare Other | Admitting: Physical Therapy

## 2018-04-11 ENCOUNTER — Ambulatory Visit: Payer: Medicare Other | Admitting: Physical Therapy

## 2018-04-12 ENCOUNTER — Encounter: Payer: Medicare Other | Admitting: Physical Therapy

## 2018-04-13 ENCOUNTER — Encounter: Payer: Medicare Other | Admitting: Physical Therapy

## 2018-04-14 ENCOUNTER — Encounter: Payer: Self-pay | Admitting: Physical Therapy

## 2018-04-14 ENCOUNTER — Ambulatory Visit: Payer: Medicare Other | Admitting: Physical Therapy

## 2018-04-14 ENCOUNTER — Encounter: Payer: Medicare Other | Admitting: Physical Therapy

## 2018-04-14 DIAGNOSIS — Z96652 Presence of left artificial knee joint: Secondary | ICD-10-CM | POA: Diagnosis not present

## 2018-04-14 DIAGNOSIS — M25662 Stiffness of left knee, not elsewhere classified: Secondary | ICD-10-CM

## 2018-04-14 DIAGNOSIS — R269 Unspecified abnormalities of gait and mobility: Secondary | ICD-10-CM

## 2018-04-14 DIAGNOSIS — M6281 Muscle weakness (generalized): Secondary | ICD-10-CM

## 2018-04-14 DIAGNOSIS — G8929 Other chronic pain: Secondary | ICD-10-CM

## 2018-04-14 DIAGNOSIS — M25562 Pain in left knee: Secondary | ICD-10-CM

## 2018-04-14 NOTE — Therapy (Signed)
[facility] [address] [place], [place], [address] Phone: [phone]   Fax:  [phone]  Physical Therapy Treatment  Patient Details  Name: Tiffany Barber MRN: [id number] Date of Birth: 1940/03/29 Referring Provider (PT): Dr. Rozelle Logan   Encounter Date: 04/14/2018  PT End of Session - 04/14/18 1357    Visit Number  17    Number of Visits  24    Date for PT Re-Evaluation  05/03/18    PT Start Time  1243    PT Stop Time  1357    PT Time Calculation (min)  74 min    Equipment Utilized During Treatment  Gait belt    Activity Tolerance  Patient tolerated treatment well;Patient limited by fatigue;Patient limited by pain    Behavior During Therapy  [facility] for tasks assessed/performed       Past Medical History:  Diagnosis Date  . 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 Hypertension     History reviewed. No pertinent surgical history.  There were no vitals filed for this visit.  Subjective Assessment - 04/14/18 1252    Subjective  Pt. entered PT via transport in w/c.  Pt. in upbeat attitude and report L arm is still hurting.  Pt. is scheduled to f/u with MD about L UE next Tuesday.      Patient is accompained by:  Family member    Pertinent History  Pt. lives with son and daughter in [place].  Pt. s/p R TKA in 2017.  Pt. has primarily used a w/c over past several years due to L knee pain.  Pt. had several falls in 2017 resulting in significant B shoulder joint arthritis/ stiffness.  Pt. requires use of transport to go to MD visits secondary to w/c use.  Pt. does limited activities outside and has a CNA assist with bathing (in bed) and household tasks 6 hours/ day.  Pt. states she does exercises with her aide on a regular basis.  Pt. likes going to church, cooking and reading books.      Limitations  Standing;Walking;House hold activities    How long can you stand comfortably?  <2 min.     Patient Stated Goals  Increase B LE  muscle strength to improve standing/ walking.      Currently in Pain?  Yes    Pain Score  5     Pain Location  Arm    Pain Orientation  Left    Pain Descriptors / Indicators  Aching    Pain Type  Chronic pain         TREATMENT  Therapeutic Exercise:  Fwd/bkwd/high marchingwalking in //-bars4laps (VC for increasedhip flexion/ step pattern/ heel strike). Seated RTB hamstring curls/ LAQ with manual resistance/ marching/ heel and toe raises 20x Sit to stands from green chair 10x  NuStep 10 min L6(no charge)  Neuro. Mm.:  3" step touches with min.to mod. UE assist in //-bars/ CGA Aire[employer] static standing/ reaching towards cones. Turning to L/R with cuing for upright posture.     Gait training:  Ambulatedin//-barswith use of QC and CGA for safety/ verbal cuing.  Occasional touch of L UE on //-bars to reassess balance.   Patient educated in proper BOS/ posture/ head position.     Reviewed goals/ HEP    PT Long Term Goals - 04/05/18 1538      PT LONG TERM GOAL #1   Title  Pt. will increase  FOTO to 52 to improve functional mobility.      Baseline  Initial FOTO: 41 2/18: 24 Pt. had difficulty answering questions and needed assistance from PT.    Time  4    Period  Weeks    Status  Not Met    Target Date  05/03/18      PT LONG TERM GOAL #2   Title  Pt. will increase L knee AROM (0 to >100 deg.) to improve functional mobility.     Baseline  L knee AROM: 0 to 103 deg. (seated position).      Time  4    Period  Weeks    Status  Achieved    Target Date  03/03/18      PT LONG TERM GOAL #3   Title  Pt. able to safely transfer from chair to [place] with mod. I and proper technique to improve mobility.      Baseline  Pt. was able to stand from chair with min. CGA and proper technique using chair/ walker to stand safely.    Time  4    Period  Weeks    Status  Achieved    Target Date  04/05/18      PT LONG TERM GOAL #4   Title  Pt. will ambulate with 100 feet with CGA  and use of RW with consistent step pattern safely.      Baseline  Pt. was able to ambulate 75 feet and requested rest break immediately but pt. ambulates still with limited B hip/knee flexion and no heel strike.      Time  4    Period  Weeks    Status  Partially Met    Target Date  05/03/18         Plan - 04/14/18 1341    Clinical Impression Statement  Limited L hip flexion/ swing through phase of gait during R stance phase with use of SPC.  Pt. was able to ambulate 20 feet from end of //-bars to Nustep at end of tx. with use of QC and CGA for safety.  Increase back discomfort/ moderate LE fatigue with prolonged standing, ther.ex. and gait.  Pt. motivated to improve independence at home with least assistive device.        Clinical Presentation  Evolving    Clinical Decision Making  Moderate    Rehab Potential  Fair    Clinical Impairments Affecting Rehab Potential  B shoulder joint stiffness    PT Frequency  2x / week    PT Duration  4 weeks    PT Treatment/Interventions  ADLs/Self Care Home Management;Cryotherapy;Moist Heat;Gait training;Stair training;Functional mobility training;Neuromuscular re-education;Balance training;Therapeutic exercise;Therapeutic activities;Patient/family education;Manual techniques;Wheelchair mobility training;Scar mobilization;Passive range of motion    PT Next Visit Plan  Gait training, balance, manual therapy    PT Home Exercise Plan  see handouts.     Consulted and Agree with Plan of Care  Patient       Patient will benefit from skilled therapeutic intervention in order to improve the following deficits and impairments:  Abnormal gait, Improper body mechanics, Pain, Decreased mobility, Decreased scar mobility, Decreased activity tolerance, Decreased endurance, Decreased range of motion, Decreased strength, Hypomobility, Decreased balance, Decreased safety awareness, Difficulty walking, Impaired flexibility  Visit Diagnosis: Status post left knee  replacement  Joint stiffness of knee, left  Muscle weakness (generalized)  Gait difficulty  Chronic pain of left knee     Problem List Patient Active Problem List  Diagnosis Date Noted  . Hypertension 12/01/2014  . Degenerative arthritis 11/21/2014   Pura Spice, PT, DPT # [id number] 04/14/2018, 2:18 PM  [facility] [address] [place], [place], [address] Phone: [phone]   Fax:  [phone]  Name: Tiffany Barber MRN: [id number] Date of Birth: Apr 28, 1940

## 2018-04-21 ENCOUNTER — Ambulatory Visit: Payer: Medicare Other | Admitting: Physical Therapy

## 2018-04-26 ENCOUNTER — Ambulatory Visit: Payer: Medicare Other | Attending: Orthopedic Surgery | Admitting: Physical Therapy

## 2018-04-26 DIAGNOSIS — G8929 Other chronic pain: Secondary | ICD-10-CM | POA: Diagnosis present

## 2018-04-26 DIAGNOSIS — Z96652 Presence of left artificial knee joint: Secondary | ICD-10-CM | POA: Diagnosis present

## 2018-04-26 DIAGNOSIS — M25662 Stiffness of left knee, not elsewhere classified: Secondary | ICD-10-CM

## 2018-04-26 DIAGNOSIS — R269 Unspecified abnormalities of gait and mobility: Secondary | ICD-10-CM | POA: Diagnosis present

## 2018-04-26 DIAGNOSIS — M6281 Muscle weakness (generalized): Secondary | ICD-10-CM | POA: Insufficient documentation

## 2018-04-26 DIAGNOSIS — M25562 Pain in left knee: Secondary | ICD-10-CM | POA: Insufficient documentation

## 2018-04-27 ENCOUNTER — Encounter: Payer: Self-pay | Admitting: Physical Therapy

## 2018-04-27 NOTE — Therapy (Signed)
[facility] [address] [place], [place], [address] Phone: [phone]   Fax:  [phone]  Physical Therapy Treatment  Patient Details  Name: Tiffany Barber MRN: [id number] Date of Birth: January 01, 1941 Referring Provider (PT): Dr. Rozelle Logan   Encounter Date: 04/26/2018  PT End of Session - 04/27/18 1918    Visit Number  18    Number of Visits  24    Date for PT Re-Evaluation  05/03/18    PT Start Time  8250    PT Stop Time  1441    PT Time Calculation (min)  64 min    Equipment Utilized During Treatment  Gait belt    Activity Tolerance  Patient tolerated treatment well;Patient limited by fatigue;Patient limited by pain    Behavior During Therapy  [facility] for tasks assessed/performed       Past Medical History:  Diagnosis Date  . 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 Hypertension     History reviewed. No pertinent surgical history.  There were no vitals filed for this visit.  Subjective Assessment - 04/27/18 1917    Subjective  Pt. states she was sick last week but doing better over weekend.  Pt. reports no back pain but L elbow/ forearm still hurting.  Pt. planning to f/u with MD to discuss 05/02/2018.      Patient is accompained by:  Family member    Pertinent History  Pt. lives with son and daughter in [place].  Pt. s/p R TKA in 2017.  Pt. has primarily used a w/c over past several years due to L knee pain.  Pt. had several falls in 2017 resulting in significant B shoulder joint arthritis/ stiffness.  Pt. requires use of transport to go to MD visits secondary to w/c use.  Pt. does limited activities outside and has a CNA assist with bathing (in bed) and household tasks 6 hours/ day.  Pt. states she does exercises with her aide on a regular basis.  Pt. likes going to church, cooking and reading books.      Limitations  Standing;Walking;House hold activities    How long can you stand comfortably?  <2 min.     Patient  Stated Goals  Increase B LE muscle strength to improve standing/ walking.      Currently in Pain?  Yes    Pain Score  5     Pain Location  Arm    Pain Orientation  Left    Pain Descriptors / Indicators  Aching    Pain Type  Chronic pain            TREATMENT  Therapeutic Exercise:  NuStep 10 min L6(no charge) Fwd/bkwd/high marchingwalking in //-bars4laps (VC for increasedhip flexion/ step pattern/ heel strike). Seated 3# LE ex.: LAQ/ marching/ heel and toe raises 20x.  Standing hip flexion/ abduction/ hamstring curls (moderate cuing for posture/ proper technique)- 20x each. Sit to stands from green chair 10x  Walking lunges in //-bars (difficulty completing properly)  Neuro. Mm.:  3" step touches with min.to mod. UE assist in //-bars/ CGA Walking in clinic with QC/ RW working on BOS/ balance.  Pt. Benefits from QC on R side secondary to L elbow/ forearm pain.   Turning to L/R with cuing for upright posture.  Maneuvering cones in hallway with use of QC    PT Long Term Goals - 04/05/18 1538      PT LONG TERM GOAL #  1   Title  Pt. will increase FOTO to 52 to improve functional mobility.      Baseline  Initial FOTO: 41 2/18: 24 Pt. had difficulty answering questions and needed assistance from PT.    Time  4    Period  Weeks    Status  Not Met    Target Date  05/03/18      PT LONG TERM GOAL #2   Title  Pt. will increase L knee AROM (0 to >100 deg.) to improve functional mobility.     Baseline  L knee AROM: 0 to 103 deg. (seated position).      Time  4    Period  Weeks    Status  Achieved    Target Date  03/03/18      PT LONG TERM GOAL #3   Title  Pt. able to safely transfer from chair to [place] with mod. I and proper technique to improve mobility.      Baseline  Pt. was able to stand from chair with min. CGA and proper technique using chair/ walker to stand safely.    Time  4    Period  Weeks    Status  Achieved    Target Date  04/05/18      PT LONG TERM  GOAL #4   Title  Pt. will ambulate with 100 feet with CGA and use of RW with consistent step pattern safely.      Baseline  Pt. was able to ambulate 75 feet and requested rest break immediately but pt. ambulates still with limited B hip/knee flexion and no heel strike.      Time  4    Period  Weeks    Status  Partially Met    Target Date  05/03/18            Plan - 04/27/18 1919    Clinical Impression Statement  Pt. needed frequent motivation to increase distance walked/ endurance with use of QC or RW.  Pt. ambulates with 2-point gait pattern with QC and mod. cuing to increase L step length/ heel strike.  Walking lunges in //-bars were difficult for pt. to understand/ complete properly.  Pt. fatigued at end of tx. and requeted to sit in w/c while walking out to transport van secondary to LE fatigue.  No increase c/o low back pain but L elbow/ forearm hurting.  Pt. benefits from use of QC to decrease L UE use with standing/walking tasks.     Stability/Clinical Decision Making  Evolving/Moderate complexity    Clinical Decision Making  Moderate    Rehab Potential  Fair    Clinical Impairments Affecting Rehab Potential  B shoulder joint stiffness    PT Frequency  2x / week    PT Duration  4 weeks    PT Treatment/Interventions  ADLs/Self Care Home Management;Cryotherapy;Moist Heat;Gait training;Stair training;Functional mobility training;Neuromuscular re-education;Balance training;Therapeutic exercise;Therapeutic activities;Patient/family education;Manual techniques;Wheelchair mobility training;Scar mobilization;Passive range of motion    PT Next Visit Plan  Gait training, balance, manual therapy.   DIscuss POC (2 more authorized visits).      PT Home Exercise Plan  see handouts.        Patient will benefit from skilled therapeutic intervention in order to improve the following deficits and impairments:  Abnormal gait, Improper body mechanics, Pain, Decreased mobility, Decreased scar  mobility, Decreased activity tolerance, Decreased endurance, Decreased range of motion, Decreased strength, Hypomobility, Decreased balance, Decreased safety awareness, Difficulty walking, Impaired flexibility  Visit Diagnosis: Status post left knee replacement  Joint stiffness of knee, left  Muscle weakness (generalized)  Gait difficulty  Chronic pain of left knee     Problem List Patient Active Problem List   Diagnosis Date Noted  . Hypertension 12/01/2014  . Degenerative arthritis 11/21/2014   Pura Spice, PT, DPT # [phone] 04/27/2018, 7:29 PM   [facility] [address] [place], [place], [address] Phone: [phone]   Fax:  [phone]  Name: Tiffany Barber MRN: [id number] Date of Birth: 11/04/1940

## 2018-04-28 ENCOUNTER — Encounter: Payer: Self-pay | Admitting: Physical Therapy

## 2018-04-28 ENCOUNTER — Other Ambulatory Visit: Payer: Self-pay

## 2018-04-28 ENCOUNTER — Ambulatory Visit: Payer: Medicare Other | Admitting: Physical Therapy

## 2018-04-28 DIAGNOSIS — M25662 Stiffness of left knee, not elsewhere classified: Secondary | ICD-10-CM

## 2018-04-28 DIAGNOSIS — Z96652 Presence of left artificial knee joint: Secondary | ICD-10-CM | POA: Diagnosis not present

## 2018-04-28 DIAGNOSIS — G8929 Other chronic pain: Secondary | ICD-10-CM

## 2018-04-28 DIAGNOSIS — M6281 Muscle weakness (generalized): Secondary | ICD-10-CM

## 2018-04-28 DIAGNOSIS — M25562 Pain in left knee: Secondary | ICD-10-CM

## 2018-04-28 DIAGNOSIS — R269 Unspecified abnormalities of gait and mobility: Secondary | ICD-10-CM

## 2018-04-30 NOTE — Therapy (Signed)
[facility] [address] [place], [place], [address] Phone: [phone]   Fax:  [phone]  Physical Therapy Treatment  Patient Details  Name: Tiffany Barber MRN: [id number] Date of Birth: 31-May-1940 Referring Provider (PT): Dr. Rozelle Logan   Encounter Date: 04/28/2018  PT End of Session - 04/30/18 1125    Visit Number  19    Number of Visits  24    Date for PT Re-Evaluation  05/03/18    PT Start Time  2992    PT Stop Time  1443    PT Time Calculation (min)  65 min    Equipment Utilized During Treatment  Gait belt    Activity Tolerance  Patient tolerated treatment well;Patient limited by fatigue;Patient limited by pain    Behavior During Therapy  [facility] for tasks assessed/performed       Past Medical History:  Diagnosis Date  . 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 Hypertension     History reviewed. No pertinent surgical history.  There were no vitals filed for this visit.  Subjective Assessment - 04/30/18 1123    Subjective  Pt. states she needs MD script for QC or HW sent to Dr. Delight Stare.  No c/o pain prior to PT tx. session.  Pt. reports she is walking more at home.      Patient is accompained by:  Family member    Pertinent History  Pt. lives with son and daughter in [place].  Pt. s/p R TKA in 2017.  Pt. has primarily used a w/c over past several years due to L knee pain.  Pt. had several falls in 2017 resulting in significant B shoulder joint arthritis/ stiffness.  Pt. requires use of transport to go to MD visits secondary to w/c use.  Pt. does limited activities outside and has a CNA assist with bathing (in bed) and household tasks 6 hours/ day.  Pt. states she does exercises with her aide on a regular basis.  Pt. likes going to church, cooking and reading books.      Limitations  Standing;Walking;House hold activities    How long can you stand comfortably?  <2 min.     Patient Stated Goals  Increase B LE  muscle strength to improve standing/ walking.      Currently in Pain?  No/denies         TREATMENT  Therapeutic Exercise:  NuStep 10 min L6(no charge/warm-up)  Seated 3# LE ex.: LAQ/ marching/ heel and toe raises 20x.  Fwd/bkwd/high marchingwalking in //-bars4laps (VC for increasedhip flexion/ step pattern/ heel strike)- with 3# ankle wts. Step ups on first step 10x L/R. Standing hip ex. With use of HW (3-way hip)  Neuro. Mm.:  Turning to L/R with cuing for upright posture.Maneuvering cones in hallway with use of QC and HW Sit to stands with [employer] at blue mat table (difficulty sitting unsupported).    Gait training:  Walking in clinic with QC/ HW working on BOS/ balance.  Pt. Benefits from QC on R side secondary to L elbow/ forearm pain.  Pt. Did like the use of HW secondary to feeling more secure/ balanced.   Pt. Requires extra time to increase L hip flexion/ step length.   No LOB       PT Long Term Goals - 04/05/18 1538      PT LONG TERM GOAL #1   Title  Pt. will increase FOTO to 52 to  improve functional mobility.      Baseline  Initial FOTO: 41 2/18: 24 Pt. had difficulty answering questions and needed assistance from PT.    Time  4    Period  Weeks    Status  Not Met    Target Date  05/03/18      PT LONG TERM GOAL #2   Title  Pt. will increase L knee AROM (0 to >100 deg.) to improve functional mobility.     Baseline  L knee AROM: 0 to 103 deg. (seated position).      Time  4    Period  Weeks    Status  Achieved    Target Date  03/03/18      PT LONG TERM GOAL #3   Title  Pt. able to safely transfer from chair to [place] with mod. I and proper technique to improve mobility.      Baseline  Pt. was able to stand from chair with min. CGA and proper technique using chair/ walker to stand safely.    Time  4    Period  Weeks    Status  Achieved    Target Date  04/05/18      PT LONG TERM GOAL #4   Title  Pt. will ambulate with 100 feet with CGA and use  of RW with consistent step pattern safely.      Baseline  Pt. was able to ambulate 75 feet and requested rest break immediately but pt. ambulates still with limited B hip/knee flexion and no heel strike.      Time  4    Period  Weeks    Status  Partially Met    Target Date  05/03/18            Plan - 04/30/18 1125    Clinical Impression Statement  Pt. is starting to ambulate increase distances with less assistance with use of QC or HW.  Pt. prefers the RW but due to L elbow/ forearm pain benefits from use of less assistive device.  Pt. remains limited with prolonged standing/ walking due to increase low back pain.  Limited L LE step pattern during 2-point gait pattern.  Pt. required extra time with sit to stands from w/c vs. green chair in clinic and limited by back pain with sitting unsupported.      Stability/Clinical Decision Making  Evolving/Moderate complexity    Clinical Decision Making  Moderate    Rehab Potential  Fair    Clinical Impairments Affecting Rehab Potential  B shoulder joint stiffness    PT Frequency  2x / week    PT Duration  4 weeks    PT Treatment/Interventions  ADLs/Self Care Home Management;Cryotherapy;Moist Heat;Gait training;Stair training;Functional mobility training;Neuromuscular re-education;Balance training;Therapeutic exercise;Therapeutic activities;Patient/family education;Manual techniques;Wheelchair mobility training;Scar mobilization;Passive range of motion    PT Next Visit Plan  Gait training, balance, manual therapy.   1 more authorized tx. session.   Check on signed script for assistive device.     PT Home Exercise Plan  see handouts.     Consulted and Agree with Plan of Care  Patient       Patient will benefit from skilled therapeutic intervention in order to improve the following deficits and impairments:  Abnormal gait, Improper body mechanics, Pain, Decreased mobility, Decreased scar mobility, Decreased activity tolerance, Decreased endurance,  Decreased range of motion, Decreased strength, Hypomobility, Decreased balance, Decreased safety awareness, Difficulty walking, Impaired flexibility  Visit Diagnosis: Status post left knee  replacement  Joint stiffness of knee, left  Muscle weakness (generalized)  Gait difficulty  Chronic pain of left knee     Problem List Patient Active Problem List   Diagnosis Date Noted  . Hypertension 12/01/2014  . Degenerative arthritis 11/21/2014   Pura Spice, PT, DPT # [phone] 04/30/2018, 11:31 AM  [facility] [address] [place], [place], [address] Phone: [phone]   Fax:  [phone]  Name: Aneliese Beaudry MRN: [id number] Date of Birth: Jul 02, 1940

## 2018-05-03 ENCOUNTER — Other Ambulatory Visit: Payer: Self-pay

## 2018-05-03 ENCOUNTER — Ambulatory Visit: Payer: Medicare Other | Admitting: Physical Therapy

## 2018-05-03 DIAGNOSIS — Z96652 Presence of left artificial knee joint: Secondary | ICD-10-CM | POA: Diagnosis not present

## 2018-05-03 DIAGNOSIS — M6281 Muscle weakness (generalized): Secondary | ICD-10-CM

## 2018-05-03 DIAGNOSIS — M25562 Pain in left knee: Secondary | ICD-10-CM

## 2018-05-03 DIAGNOSIS — M25662 Stiffness of left knee, not elsewhere classified: Secondary | ICD-10-CM

## 2018-05-03 DIAGNOSIS — R269 Unspecified abnormalities of gait and mobility: Secondary | ICD-10-CM

## 2018-05-03 DIAGNOSIS — G8929 Other chronic pain: Secondary | ICD-10-CM

## 2018-05-05 ENCOUNTER — Other Ambulatory Visit: Payer: Self-pay

## 2018-05-05 ENCOUNTER — Ambulatory Visit: Payer: Medicare Other | Admitting: Physical Therapy

## 2018-05-05 DIAGNOSIS — M25562 Pain in left knee: Secondary | ICD-10-CM

## 2018-05-05 DIAGNOSIS — R269 Unspecified abnormalities of gait and mobility: Secondary | ICD-10-CM

## 2018-05-05 DIAGNOSIS — G8929 Other chronic pain: Secondary | ICD-10-CM

## 2018-05-05 DIAGNOSIS — M25662 Stiffness of left knee, not elsewhere classified: Secondary | ICD-10-CM

## 2018-05-05 DIAGNOSIS — Z96652 Presence of left artificial knee joint: Secondary | ICD-10-CM

## 2018-05-05 DIAGNOSIS — M6281 Muscle weakness (generalized): Secondary | ICD-10-CM

## 2018-05-06 ENCOUNTER — Encounter: Payer: Self-pay | Admitting: Physical Therapy

## 2018-05-06 NOTE — Therapy (Signed)
[facility] [address] [place], [place], [address] Phone: [phone]   Fax:  [phone]  Physical Therapy Treatment  Patient Details  Name: Tiffany Barber MRN: [id number] Date of Birth: 03-Dec-1940 Referring Provider (PT): Dr. Rozelle Logan   Encounter Date: 05/03/2018  PT End of Session - 05/06/18 1854    Visit Number  20    Number of Visits  24    Date for PT Re-Evaluation  05/03/18    PT Start Time  1332    PT Stop Time  1432    PT Time Calculation (min)  60 min    Activity Tolerance  Patient tolerated treatment well;Patient limited by fatigue    Behavior During Therapy  [facility] for tasks assessed/performed       Past Medical History:  Diagnosis Date  . 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 Hypertension     History reviewed. No pertinent surgical history.  There were no vitals filed for this visit.  Subjective Assessment - 05/06/18 1852    Subjective  Pt. received a cortisone shot in L elbow/hand (helped).  Pt. reports L elbow may need surgery in the future.  Pt. entered PT with use of w/c and walked back to PT gym with HW and SBA/CGA for safety.      Patient is accompained by:  Family member    Pertinent History  Pt. lives with son and daughter in [place].  Pt. s/p R TKA in 2017.  Pt. has primarily used a w/c over past several years due to L knee pain.  Pt. had several falls in 2017 resulting in significant B shoulder joint arthritis/ stiffness.  Pt. requires use of transport to go to MD visits secondary to w/c use.  Pt. does limited activities outside and has a CNA assist with bathing (in bed) and household tasks 6 hours/ day.  Pt. states she does exercises with her aide on a regular basis.  Pt. likes going to church, cooking and reading books.      Limitations  Standing;Walking;House hold activities    Patient Stated Goals  Increase B LE muscle strength to improve standing/ walking.      Currently in Pain?   No/denies          TREATMENT  Therapeutic Exercise:  NuStep 10 min L6(no charge/warm-up) Seated3# LE ex.:LAQ/ marching/ heel and toe raises 20x. Fwd/bkwd/high marchingwalking in //-bars4laps (VC for increasedhip flexion/ step pattern/ heel strike)- with 3# ankle wts. Step ups on first step 10x L/R. Standing hip ex. With use of HW (3-way hip).  Heel raises 20x.  Neuro. Mm.:  Sit to stands with [employer] at blue mat table (difficulty sitting unsupported).   Posture correction in sitting/ standing position with HW (mirror feedback)  Gait training:  Walking in clinic with HW working on BOS/ balance. Focus on R hip flexion/ step pattern with L LE wt. Bearing while using HW.  Moderate cuing to correct posture/ cue pt. To increase stride length/ foot placement    PT Long Term Goals - 04/05/18 1538      PT LONG TERM GOAL #1   Title  Pt. will increase FOTO to 52 to improve functional mobility.      Baseline  Initial FOTO: 41 2/18: 24 Pt. had difficulty answering questions and needed assistance from PT.    Time  4    Period  Weeks    Status  Not Met  Target Date  05/03/18      PT LONG TERM GOAL #2   Title  Pt. will increase L knee AROM (0 to >100 deg.) to improve functional mobility.     Baseline  L knee AROM: 0 to 103 deg. (seated position).      Time  4    Period  Weeks    Status  Achieved    Target Date  03/03/18      PT LONG TERM GOAL #3   Title  Pt. able to safely transfer from chair to [place] with mod. I and proper technique to improve mobility.      Baseline  Pt. was able to stand from chair with min. CGA and proper technique using chair/ walker to stand safely.    Time  4    Period  Weeks    Status  Achieved    Target Date  04/05/18      PT LONG TERM GOAL #4   Title  Pt. will ambulate with 100 feet with CGA and use of RW with consistent step pattern safely.      Baseline  Pt. was able to ambulate 75 feet and requested rest break immediately but pt.  ambulates still with limited B hip/knee flexion and no heel strike.      Time  4    Period  Weeks    Status  Partially Met    Target Date  05/03/18         Plan - 05/06/18 1854    Clinical Impression Statement  Pt. benefits from use of HW with walking in clinic to decrease L hand/elbow pain.  Pt. walks with slow but consistent 2-point gait pattern.  Decrease R LE swing through phase of gait but no increase low back pain today.  Pt. motivated to increase walking distance/independence but continues to fatigue after about 30 feet of walking.  PT sent MD order for [facility].      Stability/Clinical Decision Making  Evolving/Moderate complexity    Clinical Decision Making  Moderate    Rehab Potential  Fair    Clinical Impairments Affecting Rehab Potential  B shoulder joint stiffness    PT Frequency  2x / week    PT Duration  4 weeks    PT Treatment/Interventions  ADLs/Self Care Home Management;Cryotherapy;Moist Heat;Gait training;Stair training;Functional mobility training;Neuromuscular re-education;Balance training;Therapeutic exercise;Therapeutic activities;Patient/family education;Manual techniques;Wheelchair mobility training;Scar mobilization;Passive range of motion    PT Next Visit Plan  Gait/ balance training.   Recert next tx. session    PT Home Exercise Plan  see handouts.        Patient will benefit from skilled therapeutic intervention in order to improve the following deficits and impairments:  Abnormal gait, Improper body mechanics, Pain, Decreased mobility, Decreased scar mobility, Decreased activity tolerance, Decreased endurance, Decreased range of motion, Decreased strength, Hypomobility, Decreased balance, Decreased safety awareness, Difficulty walking, Impaired flexibility  Visit Diagnosis: Status post left knee replacement  Joint stiffness of knee, left  Muscle weakness (generalized)  Gait difficulty  Chronic pain of left knee     Problem List Patient Active Problem  List   Diagnosis Date Noted  . Hypertension 12/01/2014  . Degenerative arthritis 11/21/2014   Pura Spice, PT, DPT # [phone] 05/06/2018, 7:01 PM  [facility] [address] [place], [place], [address] Phone: [phone]   Fax:  [phone]  Name: Lashaya Kienitz MRN: [id number] Date of Birth: 09/02/1940

## 2018-05-07 ENCOUNTER — Encounter: Payer: Self-pay | Admitting: Physical Therapy

## 2018-05-07 NOTE — Therapy (Addendum)
[place] [facility] [address] [place], [place], [address] Phone: [phone]   Fax:  [phone]  Physical Therapy Treatment  Patient Details  Name: Tiffany Barber MRN: [id number] Date of Birth: September 13, 1940 Referring Provider (PT): Dr. Rozelle Logan   Encounter Date: 05/05/2018    Treatment: 21 of 49.  Recert date: 9/37/9024 1334 to 72   Past Medical History:  Diagnosis Date  . 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 Hypertension     History reviewed. No pertinent surgical history.  There were no vitals filed for this visit.    PT received signed MD order for Hemiwalker to assist pt. with short distance ambulation at home/ clinic. Pt. reports decrease L elbow pain since injection.        TREATMENT  Therapeutic Exercise:  NuStep 10 min L6(no charge/warm-up) Seated3# LE ex.:LAQ/ marching/ heel and toe raises 20x. Fwd/bkwd/high marchingwalking in //-bars4laps (VC for increasedhip flexion/ step pattern/ heel strike)- with 3# ankle wts. Step ups on first step 10x L/R. Standing hip ex. With use of HW (3-way hip).  Heel raises 20x.  Neuro. Mm.:  Sit to stands with Aire[employer] at blue mat table (difficulty sitting unsupported). Posture correction in sitting/ standing position with HW (mirror feedback)  Gait training:  Walking in clinic with HWworking on BOS/ balance. Focus on R hip flexion/ step pattern with L LE wt. Bearing while using HW.  Moderate cuing to correct posture/ cue pt. To increase stride length/ foot placement    PT Long Term Goals - 06/11/18 1648      PT LONG TERM GOAL #1   Title  Pt. will increase FOTO to 52 to improve functional mobility.      Baseline  Initial FOTO: 41 2/18: 24 Pt. had difficulty answering questions and needed assistance from PT.    Time  8    Period  Weeks    Status  Not Met    Target Date  06/30/18      PT LONG TERM GOAL #2   Title  Pt. will increase L  knee AROM (0 to >100 deg.) to improve functional mobility.     Baseline  L knee AROM: 0 to 103 deg. (seated position).      Time  4    Period  Weeks    Status  Achieved    Target Date  03/03/18      PT LONG TERM GOAL #3   Title  Pt. able to safely transfer from chair to [place] with mod. I and proper technique to improve mobility.      Baseline  Pt. was able to stand from chair with min. CGA and proper technique using chair/ walker to stand safely.    Time  4    Period  Weeks    Status  Achieved    Target Date  04/05/18      PT LONG TERM GOAL #4   Title  Pt. will ambulate with 100 feet with CGA and use of RW with consistent step pattern safely.      Baseline  Pt. was able to ambulate 75 feet and requested rest break immediately but pt. ambulates still with limited B hip/knee flexion and no heel strike.      Time  8    Period  Weeks    Target Date  06/30/18      PT LONG TERM GOAL #5   Title  Pt. able  to ambulate from bed to bathroom with HW to improve mod. I and safety.      Baseline  Pt. using RW at home with CNA or family assist.  Pt. able to use HW.  Pt. will still use bedside commode    Time  8    Period  Weeks    Status  New    Target Date  06/30/18         Plan - 06/11/18 1646    Clinical Impression Statement  Pt. needed frequent motivation to increase distance walked/ endurance with use of QC or RW. Pt. ambulates with 2-point gait pattern with QC and mod. cuing to increase L step length/ heel strike. Walking lunges in //-bars were difficult for pt. to understand/ complete properly. Pt. fatigued at end of tx. and requeted to sit in w/c while walking out to transport van secondary to LE fatigue. No increase c/o low back pain but L elbow/ forearm hurting. Pt. benefits from use of QC to decrease L UE use with standing/walking tasks.     Stability/Clinical Decision Making  Evolving/Moderate complexity    Clinical Decision Making  Moderate    Rehab Potential  Fair    Clinical  Impairments Affecting Rehab Potential  B shoulder joint stiffness    PT Frequency  2x / week    PT Duration  8 weeks    PT Treatment/Interventions  ADLs/Self Care Home Management;Cryotherapy;Moist Heat;Gait training;Stair training;Functional mobility training;Neuromuscular re-education;Balance training;Therapeutic exercise;Therapeutic activities;Patient/family education;Manual techniques;Wheelchair mobility training;Scar mobilization;Passive range of motion    PT Next Visit Plan  Gait/ balance training.   Use HW in clinic.    PT Home Exercise Plan  see handouts.        Patient will benefit from skilled therapeutic intervention in order to improve the following deficits and impairments:  Abnormal gait, Improper body mechanics, Pain, Decreased mobility, Decreased scar mobility, Decreased activity tolerance, Decreased endurance, Decreased range of motion, Decreased strength, Hypomobility, Decreased balance, Decreased safety awareness, Difficulty walking, Impaired flexibility  Visit Diagnosis: Status post left knee replacement  Joint stiffness of knee, left  Muscle weakness (generalized)  Gait difficulty  Chronic pain of left knee     Problem List Patient Active Problem List   Diagnosis Date Noted  . Hypertension 12/01/2014  . Degenerative arthritis 11/21/2014   Pura Spice, PT, DPT # [id number] 06/11/2018, 4:51 PM  [place] [facility] [address] [place], [place], [address] Phone: [phone]   Fax:  [phone]  Name: Tiffany Barber MRN: [id number] Date of Birth: 11-01-1940

## 2018-05-10 ENCOUNTER — Encounter: Payer: Medicare Other | Admitting: Physical Therapy

## 2018-05-12 ENCOUNTER — Encounter: Payer: Medicare Other | Admitting: Physical Therapy

## 2018-05-17 ENCOUNTER — Encounter: Payer: Medicare Other | Admitting: Physical Therapy

## 2018-05-19 ENCOUNTER — Encounter: Payer: Medicare Other | Admitting: Physical Therapy

## 2018-05-24 ENCOUNTER — Encounter: Payer: Medicare Other | Admitting: Physical Therapy

## 2018-06-11 NOTE — Addendum Note (Signed)
Addended by: Cammie Mcgee on: 06/11/2018 04:56 PM   Modules accepted: Orders

## 2018-06-29 ENCOUNTER — Other Ambulatory Visit: Payer: Self-pay

## 2018-06-29 ENCOUNTER — Encounter: Payer: Self-pay | Admitting: Emergency Medicine

## 2018-06-29 ENCOUNTER — Emergency Department: Payer: Medicare Other

## 2018-06-29 ENCOUNTER — Inpatient Hospital Stay
Admission: EM | Admit: 2018-06-29 | Discharge: 2018-07-01 | DRG: 242 | Disposition: A | Discharge status: Home / Self Care | Payer: Medicare Other | Attending: Internal Medicine | Admitting: Internal Medicine

## 2018-06-29 DIAGNOSIS — J9601 Acute respiratory failure with hypoxia: Secondary | ICD-10-CM | POA: Diagnosis present

## 2018-06-29 DIAGNOSIS — I110 Hypertensive heart disease with heart failure: Secondary | ICD-10-CM | POA: Diagnosis present

## 2018-06-29 DIAGNOSIS — Z79891 Long term (current) use of opiate analgesic: Secondary | ICD-10-CM | POA: Diagnosis not present

## 2018-06-29 DIAGNOSIS — Z833 Family history of diabetes mellitus: Secondary | ICD-10-CM | POA: Diagnosis not present

## 2018-06-29 DIAGNOSIS — I441 Atrioventricular block, second degree: Principal | ICD-10-CM

## 2018-06-29 DIAGNOSIS — Z79899 Other long term (current) drug therapy: Secondary | ICD-10-CM

## 2018-06-29 DIAGNOSIS — E785 Hyperlipidemia, unspecified: Secondary | ICD-10-CM | POA: Diagnosis present

## 2018-06-29 DIAGNOSIS — Z8249 Family history of ischemic heart disease and other diseases of the circulatory system: Secondary | ICD-10-CM

## 2018-06-29 DIAGNOSIS — M19042 Primary osteoarthritis, left hand: Secondary | ICD-10-CM | POA: Diagnosis present

## 2018-06-29 DIAGNOSIS — Z20828 Contact with and (suspected) exposure to other viral communicable diseases: Secondary | ICD-10-CM | POA: Diagnosis present

## 2018-06-29 DIAGNOSIS — Z6841 Body Mass Index (BMI) 40.0 and over, adult: Secondary | ICD-10-CM

## 2018-06-29 DIAGNOSIS — Z950 Presence of cardiac pacemaker: Secondary | ICD-10-CM

## 2018-06-29 DIAGNOSIS — I509 Heart failure, unspecified: Secondary | ICD-10-CM | POA: Diagnosis present

## 2018-06-29 DIAGNOSIS — J810 Acute pulmonary edema: Secondary | ICD-10-CM

## 2018-06-29 DIAGNOSIS — Z7982 Long term (current) use of aspirin: Secondary | ICD-10-CM

## 2018-06-29 DIAGNOSIS — I4510 Unspecified right bundle-branch block: Secondary | ICD-10-CM

## 2018-06-29 DIAGNOSIS — Z888 Allergy status to other drugs, medicaments and biological substances status: Secondary | ICD-10-CM | POA: Diagnosis not present

## 2018-06-29 DIAGNOSIS — Z791 Long term (current) use of non-steroidal anti-inflammatories (NSAID): Secondary | ICD-10-CM

## 2018-06-29 DIAGNOSIS — M1712 Unilateral primary osteoarthritis, left knee: Secondary | ICD-10-CM | POA: Diagnosis present

## 2018-06-29 LAB — BRAIN NATRIURETIC PEPTIDE: B Natriuretic Peptide: 306 pg/mL — ABNORMAL HIGH (ref 0.0–100.0)

## 2018-06-29 LAB — CBC WITH DIFFERENTIAL/PLATELET
Abs Immature Granulocytes: 0.02 10*3/uL (ref 0.00–0.07)
Basophils Absolute: 0 10*3/uL (ref 0.0–0.1)
Basophils Relative: 0 %
Eosinophils Absolute: 0.2 10*3/uL (ref 0.0–0.5)
Eosinophils Relative: 2 %
HCT: 38.4 % (ref 36.0–46.0)
Hemoglobin: 12.2 g/dL (ref 12.0–15.0)
Immature Granulocytes: 0 %
Lymphocytes Relative: 12 %
Lymphs Abs: 1.1 10*3/uL (ref 0.7–4.0)
MCH: 31.4 pg (ref 26.0–34.0)
MCHC: 31.8 g/dL (ref 30.0–36.0)
MCV: 99 fL (ref 80.0–100.0)
Monocytes Absolute: 0.6 10*3/uL (ref 0.1–1.0)
Monocytes Relative: 7 %
Neutro Abs: 6.9 10*3/uL (ref 1.7–7.7)
Neutrophils Relative %: 79 %
Platelets: 352 10*3/uL (ref 150–400)
RBC: 3.88 MIL/uL (ref 3.87–5.11)
RDW: 14.4 % (ref 11.5–15.5)
WBC: 8.8 10*3/uL (ref 4.0–10.5)
nRBC: 0 % (ref 0.0–0.2)

## 2018-06-29 LAB — COMPREHENSIVE METABOLIC PANEL
ALT: 101 U/L — ABNORMAL HIGH (ref 0–44)
AST: 98 U/L — ABNORMAL HIGH (ref 15–41)
Albumin: 4 g/dL (ref 3.5–5.0)
Alkaline Phosphatase: 116 U/L (ref 38–126)
Anion gap: 11 (ref 5–15)
BUN: 22 mg/dL (ref 8–23)
CO2: 30 mmol/L (ref 22–32)
Calcium: 8.9 mg/dL (ref 8.9–10.3)
Chloride: 97 mmol/L — ABNORMAL LOW (ref 98–111)
Creatinine, Ser: 0.4 mg/dL — ABNORMAL LOW (ref 0.44–1.00)
GFR calc Af Amer: >60 mL/min (ref >60)
GFR calc non Af Amer: >60 mL/min (ref >60)
Glucose, Bld: 154 mg/dL — ABNORMAL HIGH (ref 70–99)
Potassium: 4.1 mmol/L (ref 3.5–5.1)
Sodium: 138 mmol/L (ref 135–145)
Total Bilirubin: 1 mg/dL (ref 0.3–1.2)
Total Protein: 7.3 g/dL (ref 6.5–8.1)

## 2018-06-29 LAB — TROPONIN I
Troponin I: 0.03 ng/mL (ref <0.03)
Troponin I: 0.03 ng/mL (ref <0.03)

## 2018-06-29 LAB — MRSA PCR SCREENING: MRSA by PCR: NEGATIVE

## 2018-06-29 LAB — GLUCOSE, CAPILLARY: Glucose-Capillary: 131 mg/dL — ABNORMAL HIGH (ref 70–99)

## 2018-06-29 LAB — MAGNESIUM: Magnesium: 2.9 mg/dL — ABNORMAL HIGH (ref 1.7–2.4)

## 2018-06-29 LAB — SARS CORONAVIRUS 2 BY RT PCR (HOSPITAL ORDER, PERFORMED IN ~~LOC~~ HOSPITAL LAB): SARS Coronavirus 2: NEGATIVE

## 2018-06-29 MED ORDER — HYDROCHLOROTHIAZIDE 25 MG PO TABS
25.0000 mg | ORAL_TABLET | Freq: Every day | ORAL | Status: DC
  Administered 2018-06-30 – 2018-07-01 (×2): 25 mg via ORAL
  Filled 2018-06-29 (×2): qty 1

## 2018-06-29 MED ORDER — BENZONATATE 100 MG PO CAPS: 100.0000 mg | ORAL_CAPSULE | Freq: Three times a day (TID) | ORAL | Status: DC | PRN

## 2018-06-29 MED ORDER — ATROPINE SULFATE 1 MG/10ML IJ SOSY
1.0000 mg | PREFILLED_SYRINGE | Freq: Once | INTRAMUSCULAR | Status: AC
  Administered 2018-06-29: 1 mg via INTRAVENOUS
  Filled 2018-06-29: qty 10

## 2018-06-29 MED ORDER — ASPIRIN 325 MG PO TABS
325.0000 mg | ORAL_TABLET | Freq: Every day | ORAL | Status: DC
  Administered 2018-06-30 – 2018-07-01 (×2): 325 mg via ORAL
  Filled 2018-06-29 (×2): qty 1

## 2018-06-29 MED ORDER — FLUTICASONE PROPIONATE 50 MCG/ACT NA SUSP
2.0000 | Freq: Every day | NASAL | Status: DC | PRN
  Filled 2018-06-29: qty 16

## 2018-06-29 MED ORDER — ATROPINE SULFATE 1 MG/10ML IJ SOSY
1.0000 mg | PREFILLED_SYRINGE | INTRAMUSCULAR | Status: DC | PRN
  Administered 2018-06-30: 01:00:00 1 mg via INTRAVENOUS
  Filled 2018-06-29: qty 10

## 2018-06-29 MED ORDER — SODIUM CHLORIDE 0.9 % IV SOLN: 250.0000 mL | INTRAVENOUS | Status: DC | PRN

## 2018-06-29 MED ORDER — ORAL CARE MOUTH RINSE
15.0000 mL | Freq: Two times a day (BID) | OROMUCOSAL | Status: DC
  Administered 2018-06-30: 22:00:00 15 mL via OROMUCOSAL

## 2018-06-29 MED ORDER — ONDANSETRON HCL 4 MG/2ML IJ SOLN
4.0000 mg | Freq: Four times a day (QID) | INTRAMUSCULAR | Status: DC | PRN
  Administered 2018-06-29: 4 mg via INTRAVENOUS
  Filled 2018-06-29: qty 2

## 2018-06-29 MED ORDER — TRAMADOL HCL 50 MG PO TABS
50.0000 mg | ORAL_TABLET | Freq: Every day | ORAL | Status: DC
  Administered 2018-06-30: 50 mg via ORAL
  Filled 2018-06-29: qty 1

## 2018-06-29 MED ORDER — CHLORHEXIDINE GLUCONATE CLOTH 2 % EX PADS
6.0000 | MEDICATED_PAD | Freq: Every day | CUTANEOUS | Status: DC
  Administered 2018-06-29 – 2018-06-30 (×2): 6 via TOPICAL

## 2018-06-29 MED ORDER — ALBUTEROL SULFATE (2.5 MG/3ML) 0.083% IN NEBU: 2.5000 mg | INHALATION_SOLUTION | RESPIRATORY_TRACT | Status: DC | PRN

## 2018-06-29 MED ORDER — SODIUM CHLORIDE 0.9 % IV SOLN
1.0000 g | Freq: Once | INTRAVENOUS | Status: DC
  Filled 2018-06-29: qty 10

## 2018-06-29 MED ORDER — MELOXICAM 7.5 MG PO TABS
15.0000 mg | ORAL_TABLET | Freq: Every day | ORAL | Status: DC | PRN
  Filled 2018-06-29: qty 2

## 2018-06-29 MED ORDER — HYDRALAZINE HCL 20 MG/ML IJ SOLN
10.0000 mg | INTRAMUSCULAR | Status: DC | PRN
  Administered 2018-06-29: 10 mg via INTRAVENOUS
  Filled 2018-06-29: qty 1

## 2018-06-29 MED ORDER — CLONIDINE HCL 0.1 MG PO TABS
0.1000 mg | ORAL_TABLET | Freq: Two times a day (BID) | ORAL | Status: DC
  Administered 2018-06-29: 0.1 mg via ORAL
  Filled 2018-06-29: qty 1

## 2018-06-29 MED ORDER — ENOXAPARIN SODIUM 40 MG/0.4ML ~~LOC~~ SOLN
40.0000 mg | SUBCUTANEOUS | Status: DC
  Administered 2018-06-29 – 2018-06-30 (×2): 40 mg via SUBCUTANEOUS
  Filled 2018-06-29 (×2): qty 0.4

## 2018-06-29 MED ORDER — FUROSEMIDE 10 MG/ML IJ SOLN
20.0000 mg | Freq: Once | INTRAMUSCULAR | Status: AC
  Administered 2018-06-29: 20 mg via INTRAVENOUS
  Filled 2018-06-29: qty 4

## 2018-06-29 MED ORDER — SODIUM CHLORIDE 0.9 % IV SOLN
500.0000 mg | Freq: Once | INTRAVENOUS | Status: DC
  Filled 2018-06-29: qty 500

## 2018-06-29 MED ORDER — FAMOTIDINE 20 MG PO TABS
20.0000 mg | ORAL_TABLET | Freq: Two times a day (BID) | ORAL | Status: DC
  Administered 2018-06-29 – 2018-07-01 (×4): 20 mg via ORAL
  Filled 2018-06-29 (×4): qty 1

## 2018-06-29 MED ORDER — SODIUM CHLORIDE 0.9% FLUSH
3.0000 mL | Freq: Two times a day (BID) | INTRAVENOUS | Status: DC
  Administered 2018-06-29 – 2018-06-30 (×2): 3 mL via INTRAVENOUS

## 2018-06-29 MED ORDER — ENSURE ENLIVE PO LIQD
237.0000 mL | Freq: Two times a day (BID) | ORAL | Status: DC
  Administered 2018-06-29: 237 mL via ORAL

## 2018-06-29 MED ORDER — ACETAMINOPHEN 325 MG PO TABS
650.0000 mg | ORAL_TABLET | ORAL | Status: DC | PRN
  Administered 2018-06-30 – 2018-07-01 (×2): 650 mg via ORAL
  Filled 2018-06-29 (×2): qty 2

## 2018-06-29 MED ORDER — SODIUM CHLORIDE 0.9% FLUSH: 3.0000 mL | INTRAVENOUS | Status: DC | PRN

## 2018-06-29 NOTE — ED Notes (Signed)
ED TO INPATIENT HANDOFF REPORT  ED Nurse Name and Phone #: Arjuna Doeden [phone]  S Name/Age/Gender Tiffany Barber 78 y.o. female Room/Bed: ED03A/ED03A  Code Status   Code Status: Full Code  Home/SNF/Other Home Patient oriented to: self, place, time and situation Is this baseline? Yes   Triage Complete: Triage complete  Chief Complaint SOB  Triage Note Pt [facility] with concerns over low saturations in the 90s on room and a bradycardia. Pt denies acute pain. PT arrives on 2L with a room air saturation of 99%.    Allergies Allergies  Allergen Reactions  . Ace Inhibitors Other (See Comments)    Other reaction(s): angioedema resulting in intubation Other reaction(s): angioedema resulting in intubation   . Nifedipine Swelling and Shortness Of Breath  . Lisinopril     Level of Care/Admitting Diagnosis ED Disposition    ED Disposition Condition Comment   Admit  Hospital Area: [facility] [[id number]]  Level of Care: Telemetry [5]  Covid Evaluation: N/A  Diagnosis: CHF (congestive heart failure) [facility]) [[id number]]  Admitting Physician: Delfino Lovett [[id number]]  Attending Physician: Delfino Lovett [[id number]]  Estimated length of stay: past midnight tomorrow  Certification:: I certify this patient will need inpatient services for at least 2 midnights  PT Class (Do Not Modify): Inpatient [101]  PT Acc Code (Do Not Modify): Private [1]       B Medical/Surgery History Past Medical History:  Diagnosis Date  . 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 Hypertension    History reviewed. No pertinent surgical history.   A IV Location/Drains/Wounds Patient Lines/Drains/Airways Status   Active Line/Drains/Airways    Name:   Placement date:   Placement time:   Site:   Days:   Peripheral IV 06/29/18 Right Antecubital   06/29/18    1624    Antecubital   less than 1   External Urinary Catheter   06/29/18    1857    -   less than 1           Intake/Output Last 24 hours No intake or output data in the 24 hours ending 06/29/18 1907  Labs/Imaging Results for orders placed or performed during the hospital encounter of 06/29/18 (from the past 48 hour(s))  Troponin I - ONCE - STAT     Status: Abnormal   Collection Time: 06/29/18  4:24 PM  Result Value Ref Range   Troponin I 0.03 (HH) <0.03 ng/mL    Comment: CRITICAL RESULT CALLED TO, READ BACK BY AND VERIFIED WITH KAILEY WALKER 06/29/18 @ 1743  MLK Performed at [facility], [address] [place], [place] [address]   CBC with Differential/Platelet     Status: None   Collection Time: 06/29/18  4:36 PM  Result Value Ref Range   WBC 8.8 4.0 - 10.5 K/uL   RBC 3.88 3.87 - 5.11 MIL/uL   Hemoglobin 12.2 12.0 - 15.0 g/dL   HCT 10.2 72.5 - 36.6 %   MCV 99.0 80.0 - 100.0 fL   MCH 31.4 26.0 - 34.0 pg   MCHC 31.8 30.0 - 36.0 g/dL   RDW 44.0 34.7 - 42.5 %   Platelets 352 150 - 400 K/uL   nRBC 0.0 0.0 - 0.2 %   Neutrophils Relative % 79 %   Neutro Abs 6.9 1.7 - 7.7 K/uL   Lymphocytes Relative 12 %   Lymphs Abs 1.1 0.7 - 4.0 K/uL   Monocytes  Relative 7 %   Monocytes Absolute 0.6 0.1 - 1.0 K/uL   Eosinophils Relative 2 %   Eosinophils Absolute 0.2 0.0 - 0.5 K/uL   Basophils Relative 0 %   Basophils Absolute 0.0 0.0 - 0.1 K/uL   Immature Granulocytes 0 %   Abs Immature Granulocytes 0.02 0.00 - 0.07 K/uL    Comment: Performed at [facility], [address] [place], [place] [address]  Comprehensive metabolic panel     Status: Abnormal   Collection Time: 06/29/18  4:36 PM  Result Value Ref Range   Sodium 138 135 - 145 mmol/L   Potassium 4.1 3.5 - 5.1 mmol/L    Comment: HEMOLYSIS AT THIS LEVEL MAY AFFECT RESULT   Chloride 97 (L) 98 - 111 mmol/L   CO2 30 22 - 32 mmol/L   Glucose, Bld 154 (H) 70 - 99 mg/dL   BUN 22 8 - 23 mg/dL   Creatinine, Ser 4.780.40 (L) 0.44 - 1.00 mg/dL   Calcium 8.9 8.9 - 29.510.3 mg/dL   Total Protein 7.3 6.5 - 8.1 g/dL   Albumin 4.0  3.5 - 5.0 g/dL   AST 98 (H) 15 - 41 U/L   ALT 101 (H) 0 - 44 U/L   Alkaline Phosphatase 116 38 - 126 U/L   Total Bilirubin 1.0 0.3 - 1.2 mg/dL   GFR calc non Af Amer >60 >60 mL/min   GFR calc Af Amer >60 >60 mL/min   Anion gap 11 5 - 15    Comment: Performed at [facility], [address] [place], [place] [address]  Magnesium     Status: Abnormal   Collection Time: 06/29/18  4:36 PM  Result Value Ref Range   Magnesium 2.9 (H) 1.7 - 2.4 mg/dL    Comment: Performed at [facility], [address] [place], [place] [address]  Brain natriuretic peptide     Status: Abnormal   Collection Time: 06/29/18  4:36 PM  Result Value Ref Range   B Natriuretic Peptide 306.0 (H) 0.0 - 100.0 pg/mL    Comment: Performed at [facility], [address] [place], [place] [address]  SARS Coronavirus 2 (CEPHEID- Performed in [facility] hospital lab), Hosp Order     Status: None   Collection Time: 06/29/18  4:41 PM  Result Value Ref Range   SARS Coronavirus 2 NEGATIVE NEGATIVE    Comment: (NOTE) If result is NEGATIVE SARS-CoV-2 target nucleic acids are NOT DETECTED. The SARS-CoV-2 RNA is generally detectable in upper and lower  respiratory specimens during the acute phase of infection. The lowest  concentration of SARS-CoV-2 viral copies this assay can detect is 250  copies / mL. A negative result does not preclude SARS-CoV-2 infection  and should not be used as the sole basis for treatment or other  patient management decisions.  A negative result may occur with  improper specimen collection / handling, submission of specimen other  than nasopharyngeal swab, presence of viral mutation(s) within the  areas targeted by this assay, and inadequate number of viral copies  (<250 copies / mL). A negative result must be combined with clinical  observations, patient history, and epidemiological information. If result is POSITIVE SARS-CoV-2 target nucleic acids are  DETECTED. The SARS-CoV-2 RNA is generally detectable in upper and lower  respiratory specimens dur ing the acute phase of infection.  Positive  results are indicative of active infection with SARS-CoV-2.  Clinical  correlation with patient history and other diagnostic information  is  necessary to determine patient infection status.  Positive results do  not rule out bacterial infection or co-infection with other viruses. If result is PRESUMPTIVE POSTIVE SARS-CoV-2 nucleic acids MAY BE PRESENT.   A presumptive positive result was obtained on the submitted specimen  and confirmed on repeat testing.  While 2019 novel coronavirus  (SARS-CoV-2) nucleic acids may be present in the submitted sample  additional confirmatory testing may be necessary for epidemiological  and / or clinical management purposes  to differentiate between  SARS-CoV-2 and other Sarbecovirus currently known to infect humans.  If clinically indicated additional testing with an alternate test  methodology [phone]) is advised. The SARS-CoV-2 RNA is generally  detectable in upper and lower respiratory sp ecimens during the acute  phase of infection. The expected result is Negative. Fact Sheet for Patients:  BoilerBrush.com.cy Fact Sheet for Healthcare Providers: https://pope.com/ This test is not yet approved or cleared by the [place] FDA and has been authorized for detection and/or diagnosis of SARS-CoV-2 by FDA under an Emergency Use Authorization (EUA).  This EUA will remain in effect (meaning this test can be used) for the duration of the COVID-19 declaration under Section 564(b)(1) of the Act, 21 U.S.C. section 360bbb-3(b)(1), unless the authorization is terminated or revoked sooner. Performed at [facility], [address] [place], [place] [address]    Dg Chest Portable 1 View  Result Date: 06/29/2018 CLINICAL DATA:  Cough.  Low oxygen  saturation. EXAM: PORTABLE CHEST 1 VIEW COMPARISON:  09/25/2017 FINDINGS: Heart is enlarged. The aorta is tortuous. There is newly seen widespread bilateral pulmonary density. The pattern is nonspecific and could represent pneumonia or heart failure. Possible small amount of pleural effusion on each side. No significant bone finding. IMPRESSION: Cardiomegaly. Widespread abnormal lung density. The differential diagnosis is heart failure versus pneumonia. Electronically Signed   By: Paulina Fusi M.D.   On: 06/29/2018 17:03    Pending Labs Unresulted Labs (From admission, onward)    Start     Ordered   07/06/18 0500  Creatinine, serum  (enoxaparin (LOVENOX)    CrCl >/= 30 ml/min)  Weekly,   STAT    Comments:  while on enoxaparin therapy    06/29/18 1900   06/30/18 0500  Basic metabolic panel  Daily,   STAT     06/29/18 1900   06/29/18 1858  CBC  (enoxaparin (LOVENOX)    CrCl >/= 30 ml/min)  Once,   STAT    Comments:  Baseline for enoxaparin therapy IF NOT ALREADY DRAWN.  Notify MD if PLT < 100 K.    06/29/18 1900   06/29/18 1858  Creatinine, serum  (enoxaparin (LOVENOX)    CrCl >/= 30 ml/min)  Once,   STAT    Comments:  Baseline for enoxaparin therapy IF NOT ALREADY DRAWN.    06/29/18 1900          Vitals/Pain Today's Vitals   06/29/18 1620 06/29/18 1749 06/29/18 1800 06/29/18 1830  BP:  (!) 165/73 (!) 158/54 (!) 137/54  Pulse:  (!) 39 (!) 35 (!) 40  Resp:  Temp:      TempSrc:      SpO2:  99% 97% 100%  Weight: 89.4 kg     Height: 5' (1.524 m)     PainSc: 0-No pain       Isolation Precautions No active isolations  Medications Medications  meloxicam (MOBIC) tablet 15 mg (has no administration in time range)  cloNIDine (CATAPRES) tablet 0.1 mg (has no administration in time range)  hydrochlorothiazide (HYDRODIURIL) tablet 25 mg (has no administration in time range)  aspirin tablet 325 mg (has no administration in time range)  fluticasone (FLONASE) 50 MCG/ACT nasal  spray 2 spray (has no administration in time range)  traMADol (ULTRAM) tablet 50 mg (has no administration in time range)  albuterol (VENTOLIN HFA) 108 (90 Base) MCG/ACT inhaler 2 puff (has no administration in time range)  benzonatate (TESSALON) capsule 100 mg (has no administration in time range)  famotidine (PEPCID) tablet 20 mg (has no administration in time range)  sodium chloride flush (NS) 0.9 % injection 3 mL (has no administration in time range)  sodium chloride flush (NS) 0.9 % injection 3 mL (has no administration in time range)  0.9 %  sodium chloride infusion (has no administration in time range)  acetaminophen (TYLENOL) tablet 650 mg (has no administration in time range)  ondansetron (ZOFRAN) injection 4 mg (has no administration in time range)  enoxaparin (LOVENOX) injection 40 mg (has no administration in time range)  atropine 1 MG/10ML injection 1 mg (1 mg Intravenous Given 06/29/18 1753)  furosemide (LASIX) injection 20 mg (20 mg Intravenous Given 06/29/18 1854)    Mobility walks with device High fall risk   Focused Assessments Cardiac Assessment Handoff:  Cardiac Rhythm: Sinus bradycardia Lab Results  Component Value Date   TROPONINI 0.03 (HH) 06/29/2018   No results found for: DDIMER Does the Patient currently have chest pain? No     R Recommendations: See Admitting Provider Note  Report given to:   Additional Notes:

## 2018-06-29 NOTE — H&P (Signed)
[facility] - [place] at [facility]   PATIENT NAME: Tiffany Barber    MR#:  [id number]  DATE OF BIRTH:  03-25-1940  DATE OF ADMISSION:  06/29/2018  PRIMARY CARE PHYSICIAN: Leanna Sato, MD   REQUESTING/REFERRING PHYSICIAN: Nita Sickle, MD  CHIEF COMPLAINT:   Chief Complaint  Patient presents with  . Shortness of Breath   HISTORY OF PRESENT ILLNESS:  Tiffany Barber  is a 78 y.o. female with a known history of hypertension and arthritis who presents for evaluation of shortness of breath.  Patient reports that she has had a dry cough for the last 5 days.  Was seen at urgent care a few days ago and tested negative for COVID.  Today started having shortness of breath and went back to urgent care.  Upon arrival she was found to be bradycardic and hypoxic with oxygen in the 90s.  PAST MEDICAL HISTORY:   Past Medical History:  Diagnosis Date  . 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 Hypertension    PAST SURGICAL HISTORY:  History reviewed. No pertinent surgical history.  SOCIAL HISTORY:   Social History   Tobacco Use  . Smoking status: Never Smoker  . Smokeless tobacco: Never Used  Substance Use Topics  . Alcohol use: No   FAMILY HISTORY:   Family History  Problem Relation Age of Onset  . Hypertension Mother   . Diabetes Daughter   . Hyperlipidemia Daughter     DRUG ALLERGIES:   Allergies  Allergen Reactions  . Ace Inhibitors Other (See Comments)    Other reaction(s): angioedema resulting in intubation Other reaction(s): angioedema resulting in intubation   . Nifedipine Swelling and Shortness Of Breath  . Lisinopril    REVIEW OF SYSTEMS:   Review of Systems  Constitutional: Negative for diaphoresis, fever, malaise/fatigue and weight loss.  HENT: Negative for ear discharge, ear pain, hearing loss, nosebleeds, sore throat and tinnitus.   Eyes: Negative for blurred vision and pain.  Respiratory: Positive for cough and  shortness of breath. Negative for hemoptysis and wheezing.   Cardiovascular: Negative for chest pain, palpitations, orthopnea and leg swelling.  Gastrointestinal: Negative for abdominal pain, blood in stool, constipation, diarrhea, heartburn, nausea and vomiting.  Genitourinary: Negative for dysuria, frequency and urgency.  Musculoskeletal: Negative for back pain and myalgias.  Skin: Negative for itching and rash.  Neurological: Negative for dizziness, tingling, tremors, focal weakness, seizures, weakness and headaches.  Psychiatric/Behavioral: Negative for depression. The patient is not nervous/anxious.    MEDICATIONS AT HOME:   Prior to Admission medications   Medication Sig Start Date End Date Taking? Authorizing Provider  amLODipine (NORVASC) 10 MG tablet Take 10 mg by mouth daily.   Yes [provider]  amoxicillin (AMOXIL) 875 MG tablet Take 875 mg by mouth 2 (two) times a day. 06/28/18  Yes [provider]  aspirin 325 MG tablet Take 325 mg by mouth daily.   Yes [provider]  cloNIDine (CATAPRES) 0.1 MG tablet Take 0.1 mg by mouth 2 (two) times daily. 05/23/14  Yes [provider]  famotidine (PEPCID) 20 MG tablet Take 20 mg by mouth 2 (two) times a day. 04/20/18  Yes [provider]  hydrochlorothiazide (HYDRODIURIL) 25 MG tablet Take 25 mg by mouth daily. 05/23/14  Yes [provider]  lovastatin (MEVACOR) 20 MG tablet Take 1 tablet by mouth daily. 06/29/17  Yes [provider]  albuterol (VENTOLIN HFA) 108 (90 Base)  MCG/ACT inhaler Inhale 2 puffs into the lungs every 4 (four) hours as needed for wheezing. 06/22/18   [provider]  benzonatate (TESSALON) 100 MG capsule Take 100 mg by mouth 3 (three) times daily as needed for cough. 06/22/18   [provider]  fluticasone (FLONASE) 50 MCG/ACT nasal spray Place 2 sprays into both nostrils daily. 06/29/17   [provider]  meclizine (ANTIVERT) 25 MG tablet  Take 1 tablet (25 mg total) by mouth 3 (three) times daily as needed for dizziness. Patient not taking: Reported on 06/29/2018 09/28/17   Sharman Cheek, MD  meloxicam (MOBIC) 15 MG tablet Take 15 mg by mouth daily. 05/23/14   [provider]  NYSTATIN powder Apply 1 application topically 4 (four) times daily. 04/20/18   [provider]  traMADol (ULTRAM) 50 MG tablet Take 1 tablet by mouth at bedtime.  07/14/17   [provider]   VITAL SIGNS:  Blood pressure (!) 137/54, pulse (!) 40, temperature 97.8 F (36.6 C), temperature source Oral, resp. rate 12, height 5' (1.524 m), weight 89.4 kg, SpO2 100 %.  PHYSICAL EXAMINATION:  Physical Exam  GENERAL:  78 y.o.-year-old patient lying in the bed with no acute distress.  EYES: Pupils equal, round, reactive to light and accommodation. No scleral icterus. Extraocular muscles intact.  HEENT: Head atraumatic, normocephalic. Oropharynx and nasopharynx clear.  NECK:  Supple, no jugular venous distention. No thyroid enlargement, no tenderness.  LUNGS: Decreased breath sounds bilaterally, no wheezing, rales,rhonchi or crepitation. No use of accessory muscles of respiration.  CARDIOVASCULAR: S1, S2 normal. No murmurs, rubs, or gallops.  Bradycardia ABDOMEN: Soft, nontender, nondistended. Bowel sounds present. No organomegaly or mass.  EXTREMITIES: No pedal edema, cyanosis, or clubbing.  NEUROLOGIC: Cranial nerves II through XII are intact. Muscle strength 5/5 in all extremities. Sensation intact. Gait not checked.  PSYCHIATRIC: The patient is alert and oriented x 3.  SKIN: No obvious rash, lesion, or ulcer.  LABORATORY PANEL:   CBC Recent Labs  Lab 06/29/18 1636  WBC 8.8  HGB 12.2  HCT 38.4  PLT 352   ------------------------------------------------------------------------------------------------------------------  Chemistries  Recent Labs  Lab 06/29/18 1636  NA 138  K 4.1  CL 97*  CO2 30  GLUCOSE 154*  BUN 22   CREATININE 0.40*  CALCIUM 8.9  MG 2.9*  AST 98*  ALT 101*  ALKPHOS 116  BILITOT 1.0   ------------------------------------------------------------------------------------------------------------------  Cardiac Enzymes Recent Labs  Lab 06/29/18 1624  TROPONINI 0.03*   ------------------------------------------------------------------------------------------------------------------  RADIOLOGY:  Dg Chest Portable 1 View  Result Date: 06/29/2018 CLINICAL DATA:  Cough.  Low oxygen saturation. EXAM: PORTABLE CHEST 1 VIEW COMPARISON:  09/25/2017 FINDINGS: Heart is enlarged. The aorta is tortuous. There is newly seen widespread bilateral pulmonary density. The pattern is nonspecific and could represent pneumonia or heart failure. Possible small amount of pleural effusion on each side. No significant bone finding. IMPRESSION: Cardiomegaly. Widespread abnormal lung density. The differential diagnosis is heart failure versus pneumonia. Electronically Signed   By: Paulina Fusi M.D.   On: 06/29/2018 17:03   IMPRESSION AND PLAN:  78 year old female being admitted for possible new CHF, second-degree heart block and acute hypoxic respiratory failure  *Acute hypoxic respiratory failure: Exact etiology not known -Possible new CHF although BNP not very impressive could have some fluid overload I do not think this is pneumonia.  With no fever or leukocytosis. I will hold off antibiotics -She is needing 2 L oxygen which is new  *Possible new CHF/pulmonary  edema -Consult cardiology, secure text sent to Dr. Lady Gary -Obtain 2D echo  *Second-degree heart block -Her heart rate is dropping into 30s and 40s even at rest. -ED provider has discussed this with Dr. Lady Gary who recommends just monitoring for now and no intervention.  I will hold off any rate controlling agents and monitor her in the stepdown  *Hypertension -Continue HCTZ  Discussed case with eICU.  She remains at high risk for significant  bradycardia and possible cardiac arrest.  She is critically sick will be observed in stepdown for now.    All the records are reviewed and case discussed with ED provider. Management plans discussed with the patient, nursing and they are in agreement.  CODE STATUS: FULL CODE  TOTAL TIME (critical care) TAKING CARE OF THIS PATIENT: 45 minutes.    Delfino Lovett M.D on 06/29/2018 at 7:00 PM  Between 7am to 6pm - Pager - [phone]  After 6pm go to [email or web address] - Social research officer, government  [facility] [place] [facility]  Office  [phone]  CC: Primary care physician; Leanna Sato, MD   Note: This dictation was prepared with Dragon dictation along with smaller phrase technology. Any transcriptional errors that result from this process are unintentional.

## 2018-06-29 NOTE — ED Triage Notes (Signed)
Pt arrives [facility]s [facility] with concerns over low saturations in the 90s on room and a bradycardia. Pt denies acute pain. PT arrives on 2L with a room air saturation of 99%.

## 2018-06-29 NOTE — Consult Note (Signed)
Name: Tiffany Barber MRN: [id number] DOB: October 03, 1940    ADMISSION DATE:  06/29/2018 CONSULTATION DATE:  06/29/2018  REFERRING MD :  Dr. Sherryll Burger  CHIEF COMPLAINT:  Shortness of Breath  BRIEF PATIENT DESCRIPTION:  78 y.o. Female with PMH notable for HTN and arthritis admitted with Acute Hypoxic Respiratory Failure in setting of Pulmonary Edema and 2nd Degree AV Block (HR in 30's - 40's).  Cardiology was consulted in the ED and recommended monitoring for now.  Pt does not take any outpatient Beta Blockers or Calcium Channel Blockers.  SIGNIFICANT EVENTS  06/29/18>> Admitted to [facility] Stepdown unit  STUDIES:  Echocardiogram 5/14>>  CULTURES: SARS-CoV-2  06/29/18>> Negative  ANTIBIOTICS: N/A  HISTORY OF PRESENT ILLNESS:   Tiffany Barber is a 78 year old female with a past medical history notable for hypertension and arthritis who presents to [facility] ED on 06/29/2018 with complaints of shortness of breath.  She reports that he has had a dry cough for the last 5 days, and was seen at urgent care a few days ago of which she tested negative for COVID-19.  Today she began having shortness of breath, therefore she went back to urgent care and was found to be bradycardic and hypoxic, therefore she was referred to the ED. Upon presentation to the ED she was noted to be in second-degree AV block with heart rate in the 30s to 40s.  She remains hemodynamically stable and mental status is intact.  She denies fever/chills, chest pain, dizziness, nausea/vomiting, or sick contacts. She does report bilateral lower extremity edema, orthopnea, and paroxysmal nocturnal dyspnea.  Initial work-up in the ED reveals BNP 306, troponin 0 0.03, AST 98, ALT 101, WBC 8.8, COVID-19 PCR is negative.  Chest x-ray concerning for pulmonary edema versus pneumonia (low suspicion for pneumonia given lack of fever or leukocytosis). Cardiology was consulted in the ED who recommends monitoring for now.  She is being admitted to [facility] stepdown unit  for treatment of Acute Hypoxic Respiratory Failure secondary to pulmonary edema and Second-degree AV block.  PCCM is consulted for further management.  PAST MEDICAL HISTORY :   has a past medical history of Arthritis and Hypertension.  has no past surgical history on file. Prior to Admission medications   Medication Sig Start Date End Date Taking? Authorizing Provider  amLODipine (NORVASC) 10 MG tablet Take 10 mg by mouth daily.   Yes [provider]  amoxicillin (AMOXIL) 875 MG tablet Take 875 mg by mouth 2 (two) times a day. 06/28/18  Yes [provider]  aspirin 325 MG tablet Take 325 mg by mouth daily.   Yes [provider]  cloNIDine (CATAPRES) 0.1 MG tablet Take 0.1 mg by mouth 2 (two) times daily. 05/23/14  Yes [provider]  famotidine (PEPCID) 20 MG tablet Take 20 mg by mouth 2 (two) times a day. 04/20/18  Yes [provider]  hydrochlorothiazide (HYDRODIURIL) 25 MG tablet Take 25 mg by mouth daily. 05/23/14  Yes [provider]  lovastatin (MEVACOR) 20 MG tablet Take 1 tablet by mouth daily. 06/29/17  Yes [provider]  albuterol (VENTOLIN HFA) 108 (90 Base) MCG/ACT inhaler Inhale 2 puffs into the lungs every 4 (four) hours as needed for wheezing. 06/22/18   [provider]  benzonatate (TESSALON) 100 MG capsule Take 100 mg by mouth 3 (three) times daily as needed for cough. 06/22/18   [provider]  fluticasone (FLONASE) 50 MCG/ACT nasal spray Place 2 sprays into both nostrils daily. 06/29/17  [provider]  meclizine (ANTIVERT) 25 MG tablet Take 1 tablet (25 mg total) by mouth 3 (three) times daily as needed for dizziness. Patient not taking: Reported on 06/29/2018 09/28/17   Sharman CheekStafford, Phillip, MD  meloxicam (MOBIC) 15 MG tablet Take 15 mg by mouth daily. 05/23/14   [provider]  NYSTATIN powder Apply 1 application topically 4 (four) times daily. 04/20/18   [provider]  traMADol  (ULTRAM) 50 MG tablet Take 1 tablet by mouth at bedtime.  07/14/17   [provider]   Allergies  Allergen Reactions   Ace Inhibitors Other (See Comments)    Other reaction(s): angioedema resulting in intubation Other reaction(s): angioedema resulting in intubation    Nifedipine Swelling and Shortness Of Breath   Lisinopril     FAMILY HISTORY:  family history includes Diabetes in her daughter; Hyperlipidemia in her daughter; Hypertension in her mother. SOCIAL HISTORY:  reports that she has never smoked. She has never used smokeless tobacco. She reports that she does not drink alcohol or use drugs.   REVIEW OF SYSTEMS:  Positives in BOLD Constitutional: Negative for fever, chills, weight loss, malaise/fatigue and diaphoresis.  HENT: Negative for hearing loss, ear pain, nosebleeds, congestion, sore throat, neck pain, tinnitus and ear discharge.   Eyes: Negative for blurred vision, double vision, photophobia, pain, discharge and redness.  Respiratory: Negative for +cough, hemoptysis, sputum production, +shortness of breath, wheezing and stridor.   Cardiovascular: Negative for chest pain, palpitations, +orthopnea, claudication, +leg swelling and +PND.  Gastrointestinal: Negative for heartburn, nausea, vomiting, abdominal pain, diarrhea, constipation, blood in stool and melena.  Genitourinary: Negative for dysuria, urgency, frequency, hematuria and flank pain.  Musculoskeletal: Negative for myalgias, back pain, joint pain and falls.  Skin: Negative for itching and rash.  Neurological: Negative for dizziness, tingling, tremors, sensory change, speech change, focal weakness, seizures, loss of consciousness, weakness and headaches.  Endo/Heme/Allergies: Negative for environmental allergies and polydipsia. Does not bruise/bleed easily.  SUBJECTIVE:  -Pt reports mild shortness of breath and dry cough, orthopnea and Paroxysmal nocturnal dyspnea, and bilateral LE edema -Denies chest  pain, fever/chills, lightheadedness -On Nasal Cannula -Zoll pads being placed on pt in event she were to need external pacing  VITAL SIGNS: Temp:  [97.8 F (36.6 C)] 97.8 F (36.6 C) (05/13 1619) Pulse Rate:  [35-44] 38 (05/13 1900) Resp:  [10-18] 12 (05/13 1900) BP: (137-182)/(48-73) 168/54 (05/13 1900) SpO2:  [97 %-100 %] 99 % (05/13 1900) Weight:  [89.4 kg] 89.4 kg (05/13 1620)  PHYSICAL EXAMINATION: General: Acutely ill-appearing obese female, sitting in bed, on nasal cannula, in no acute distress Neuro: Awake, alert and oriented x4, follows commands, speech clear, no focal deficits HEENT: Atraumatic, normocephalic, neck supple, no JVD Cardiovascular: Bradycardia, regular rhythm, S1-S2, no murmurs rubs or gallops Lungs: Clear diminished bilaterally with fine crackles auscultated in bilateral bases, mild tachypnea, even, nonlabored Abdomen: Obese, soft, nontender, nondistended, no guarding or rebound tenderness, bowel sounds positive x4 Musculoskeletal: No deformities, normal bulk and tone, 2+ edema bilateral lower extremities Skin: Warm and dry, no obvious rashes lesions or ulcerations  Recent Labs  Lab 06/29/18 1636  NA 138  K 4.1  CL 97*  CO2 30  BUN 22  CREATININE 0.40*  GLUCOSE 154*   Recent Labs  Lab 06/29/18 1636  HGB 12.2  HCT 38.4  WBC 8.8  PLT 352   Dg Chest Portable 1 View  Result Date: 06/29/2018 CLINICAL DATA:  Cough.  Low oxygen saturation. EXAM: PORTABLE CHEST  1 VIEW COMPARISON:  09/25/2017 FINDINGS: Heart is enlarged. The aorta is tortuous. There is newly seen widespread bilateral pulmonary density. The pattern is nonspecific and could represent pneumonia or heart failure. Possible small amount of pleural effusion on each side. No significant bone finding. IMPRESSION: Cardiomegaly. Widespread abnormal lung density. The differential diagnosis is heart failure versus pneumonia. Electronically Signed   By: Paulina Fusi M.D.   On: 06/29/2018 17:03     ASSESSMENT / PLAN:  Acute Hypoxic Respiratory Failure secondary to Pulmonary Edema -Supplemental O2 as needed to maintain O2 sats>92% -BiPAP if needed, wean as tolerated -Follow intermittent CXR and ABG as needed -IV Lasix as tolerated -PRN Bronchodilators  2nd Degree AV Block Acute Decompensated CHF Mildly elevated Troponin, likely demand ischemia in setting of Acute Hypoxic Respiratory Failure Hypertension -Cardiac monitoring -Maintain MAP >65 -Cardiology consulted, appreciate input -Dopamine if needed to maintain MAP goal / or for bradycardia -PRN Atropine for sustained HR <30 and if pt becomes sypmtomatic -IV Lasix as tolerated; Received 20 mg IV Lasix in ED x1 dose 5/13 -Trend BNP -Echocardiogram pending -Trend Troponin -Continue Clonidine and HCTZ  Mildly elevated LFT's, likely secondary to Acute Decompensated CHF -Trend LFT's -If LFT's trend upwards, then consider RUQ Abdominal Ultrasound     DISPOSITION: Stepdown GOALS OF CARE: Full Code VTE PROPHYLAXIS: Lovenox SQ UPDATES: Updated pt at bedside 06/29/18.  Harlon Ditty, AGACNP-BC [facility] Pager: [phone] Cell: [phone]  06/29/2018, 7:49 PM

## 2018-06-29 NOTE — Progress Notes (Signed)
eLink Physician-Brief Progress Note Patient Name: Tiffany Barber DOB: 11-30-1940 MRN: [id number]   Date of Service  06/29/2018  HPI/Events of Note  Pt with marked bradycardia secondary to type 2 heart block. She is currently hemodynamically stable. Symptoms are however consistent with mild CHF. Cardiology is following and 2-D echo is pending. Will cycle cardiac enzymes although pt has not complained of chest pain and cardiac ischemia is unlikely cause of bradycardia, more likely sick sinus syndrome.  eICU Interventions  Troponin x 3, A.M. Echo, monitor in step down unit.        [name] 06/29/2018, 8:26 PM

## 2018-06-29 NOTE — ED Provider Notes (Signed)
[facility] Emergency Department Provider Note  ____________________________________________  Time seen: Approximately 4:43 PM  I have reviewed the triage vital signs and the nursing notes.   HISTORY  Chief Complaint Shortness of Breath   HPI Tiffany Barber is a 78 y.o. female with a history of hypertension and arthritis who presents for evaluation of shortness of breath.  Patient reports that she has had a dry cough for the last 5 days.  Was seen at urgent care a few days ago and tested negative for COVID.  Today started having shortness of breath and went back to urgent care.  Upon arrival she was found to be bradycardic and hypoxic with oxygen in the 90s.  She was placed on 2 L and sent to the emergency room for evaluation.  Patient denies chest pain or fever, dizziness, any history personal or familial heart disease.  No prior history of smoking, no history of COPD or asthma.  Patient reports mild shortness of breath at rest which becomes more significant with ambulation.  No known exposures to COVID.  No vomiting or diarrhea.  Past Medical History:  Diagnosis Date   Arthritis    left knee and left hand in particular   Hypertension     Patient Active Problem List   Diagnosis Date Noted   Hypertension 12/01/2014   Degenerative arthritis 11/21/2014    History reviewed. No pertinent surgical history.  Prior to Admission medications   Medication Sig Start Date End Date Taking? Authorizing Provider  amLODipine (NORVASC) 10 MG tablet Take 10 mg by mouth daily.   Yes [provider]  amoxicillin (AMOXIL) 875 MG tablet Take 875 mg by mouth 2 (two) times a day. 06/28/18  Yes [provider]  aspirin 325 MG tablet Take 325 mg by mouth daily.   Yes [provider]  cloNIDine (CATAPRES) 0.1 MG tablet Take 0.1 mg by mouth 2 (two) times daily. 05/23/14  Yes [provider]  famotidine (PEPCID) 20 MG tablet Take 20 mg by mouth  2 (two) times a day. 04/20/18  Yes [provider]  hydrochlorothiazide (HYDRODIURIL) 25 MG tablet Take 25 mg by mouth daily. 05/23/14  Yes [provider]  lovastatin (MEVACOR) 20 MG tablet Take 1 tablet by mouth daily. 06/29/17  Yes [provider]  albuterol (VENTOLIN HFA) 108 (90 Base) MCG/ACT inhaler Inhale 2 puffs into the lungs every 4 (four) hours as needed for wheezing. 06/22/18   [provider]  benzonatate (TESSALON) 100 MG capsule Take 100 mg by mouth 3 (three) times daily as needed for cough. 06/22/18   [provider]  fluticasone (FLONASE) 50 MCG/ACT nasal spray Place 2 sprays into both nostrils daily. 06/29/17   [provider]  meclizine (ANTIVERT) 25 MG tablet Take 1 tablet (25 mg total) by mouth 3 (three) times daily as needed for dizziness. Patient not taking: Reported on 06/29/2018 09/28/17   Sharman Cheek, MD  meloxicam (MOBIC) 15 MG tablet Take 15 mg by mouth daily. 05/23/14   [provider]  NYSTATIN powder Apply 1 application topically 4 (four) times daily. 04/20/18   [provider]  traMADol (ULTRAM) 50 MG tablet Take 1 tablet by mouth at bedtime.  07/14/17   [provider]    Allergies Ace inhibitors; Nifedipine; and Lisinopril  Family History  Problem Relation Age of Onset   Hypertension Mother    Diabetes Daughter    Hyperlipidemia Daughter     Social History Social History  Tobacco Use   Smoking status: Never Smoker   Smokeless tobacco: Never Used  Substance Use Topics   Alcohol use: No   Drug use: No    Review of Systems  Constitutional: Negative for fever. Eyes: Negative for visual changes. ENT: Negative for sore throat. Neck: No neck pain  Cardiovascular: Negative for chest pain. Respiratory: + shortness of breath and cough Gastrointestinal: Negative for abdominal pain, vomiting or diarrhea. Genitourinary: Negative for dysuria. Musculoskeletal: Negative for back  pain. Skin: Negative for rash. Neurological: Negative for headaches, weakness or numbness. Psych: No SI or HI  ____________________________________________   PHYSICAL EXAM:  VITAL SIGNS: ED Triage Vitals  Enc Vitals Group     BP 06/29/18 1619 (!) 182/48     Pulse Rate 06/29/18 1619 (!) 44     Resp 06/29/18 1619 18     Temp 06/29/18 1619 97.8 F (36.6 C)     Temp Source 06/29/18 1619 Oral     SpO2 06/29/18 1619 99 %     Weight 06/29/18 1620 197 lb (89.4 kg)     Height 06/29/18 1620 5' (1.524 m)     Head Circumference --      Peak Flow --      Pain Score 06/29/18 1620 0     Pain Loc --      Pain Edu? --      Excl. in GC? --     Constitutional: Alert and oriented. Well appearing and in no apparent distress. HEENT:      Head: Normocephalic and atraumatic.         Eyes: Conjunctivae are normal. Sclera is non-icteric.       Mouth/Throat: Mucous membranes are moist.       Neck: Supple with no signs of meningismus. Cardiovascular: Bradycardic with regular rhythm. No murmurs, gallops, or rubs. 2+ symmetrical distal pulses are present in all extremities. No JVD. Respiratory: Normal respiratory effort. Faint crackles bilaterally. Normal WOB and sating 96% on RA Gastrointestinal: Soft, non tender, and non distended with positive bowel sounds. No rebound or guarding. Musculoskeletal: Nontender with normal range of motion in all extremities. No edema, cyanosis, or erythema of extremities. Neurologic: Normal speech and language. Face is symmetric. Moving all extremities. No gross focal neurologic deficits are appreciated. Skin: Skin is warm, dry and intact. No rash noted. Psychiatric: Mood and affect are normal. Speech and behavior are normal.  ____________________________________________   LABS (all labs ordered are listed, but only abnormal results are displayed)  Labs Reviewed  TROPONIN I - Abnormal; Notable for the following components:      Result Value   Troponin I 0.03 (*)     All other components within normal limits  COMPREHENSIVE METABOLIC PANEL - Abnormal; Notable for the following components:   Chloride 97 (*)    Glucose, Bld 154 (*)    Creatinine, Ser 0.40 (*)    AST 98 (*)    ALT 101 (*)    All other components within normal limits  MAGNESIUM - Abnormal; Notable for the following components:   Magnesium 2.9 (*)    All other components within normal limits  BRAIN NATRIURETIC PEPTIDE - Abnormal; Notable for the following components:   B Natriuretic Peptide 306.0 (*)    All other components within normal limits  SARS CORONAVIRUS 2 (HOSPITAL ORDER, PERFORMED IN [facility])  CBC WITH DIFFERENTIAL/PLATELET   ____________________________________________  EKG  ED ECG REPORT I, Nita Sickle, the attending physician, personally viewed and interpreted  this ECG.  Regular, AV block 2:1, rate of 41, bifascicular block, right bundle branch block, LVH, normal QTC, left axis deviation, no ST elevations or depressions.  This is new when compared to prior. ____________________________________________  RADIOLOGY  I have personally reviewed the images performed during this visit and I agree with the Radiologist's read.   Interpretation by Radiologist:  Dg Chest Portable 1 View  Result Date: 06/29/2018 CLINICAL DATA:  Cough.  Low oxygen saturation. EXAM: PORTABLE CHEST 1 VIEW COMPARISON:  09/25/2017 FINDINGS: Heart is enlarged. The aorta is tortuous. There is newly seen widespread bilateral pulmonary density. The pattern is nonspecific and could represent pneumonia or heart failure. Possible small amount of pleural effusion on each side. No significant bone finding. IMPRESSION: Cardiomegaly. Widespread abnormal lung density. The differential diagnosis is heart failure versus pneumonia. Electronically Signed   By: Paulina Fusi M.D.   On: 06/29/2018 17:03      ____________________________________________   PROCEDURES  Procedure(s)  performed: None Procedures Critical Care performed: yes  CRITICAL CARE Performed by: Nita Sickle  ?  Total critical care time: 40 min  Critical care time was exclusive of separately billable procedures and treating other patients.  Critical care was necessary to treat or prevent imminent or life-threatening deterioration.  Critical care was time spent personally by me on the following activities: development of treatment plan with patient and/or surrogate as well as nursing, discussions with consultants, evaluation of patient's response to treatment, examination of patient, obtaining history from patient or surrogate, ordering and performing treatments and interventions, ordering and review of laboratory studies, ordering and review of radiographic studies, pulse oximetry and re-evaluation of patient's condition.  ____________________________________________   INITIAL IMPRESSION / ASSESSMENT AND PLAN / ED COURSE  78 y.o. female with a history of hypertension and arthritis who presents for evaluation of shortness of breath x 1 day and dry cough for several days. No fever. Hypoxic and bradycardic at urgent care. Here with normal WOB, sating 96% on RA at rest with faint crackles bilaterally. No pitting edema, ECHO in 10/2017 with no evidence of CHF. EKG showing new AV block with 2:1 ratio, rate of 41, normal BP. Patient denies CP. Ddx PNA, new CHF, symptomatic block, COVD. Labs, CXR, and COVD pending. Patient not on BB or CCB. Will consult cardiology. Electrolytes pending. Pacer pads placed on patient. Will tray 1mg  of IV atropine.  _________________________ 6:13 PM on 06/29/2018 -----------------------------------------  Significant response to atropine.  Patient's heart rate momentarily went up to the mid 50s but then went back down to the 40s.  She remains hemodynamically stable and with normal mental status.  COVID testing negative.  Normal white count, no anemia, normal  electrolytes.  Initial troponin of 0.03.  Chest x-ray concerning for pneumonia versus pulmonary edema.  Low suspicion for PNA with no fever or leukocytosis, will hold abx for now. Spoke with her cardiologist, Dr. Lady Gary who recommended admission for diureses but no further interventions since BP is normal. BNP elevated. Presentation concerning for new heart failure. Will give IV lasix and admit to Hospitalist service.       As part of my medical decision making, I reviewed the following data within the electronic MEDICAL RECORD NUMBER Nursing notes reviewed and incorporated, Labs reviewed , EKG interpreted , Old EKG reviewed, Old chart reviewed, Radiograph reviewed , Discussed with admitting physician , A consult was requested and obtained from this/these consultant(s) Cardiology, Notes from prior ED visits and [place] Controlled Substance Database  Pertinent labs & imaging results that were available during my care of the patient were reviewed by me and considered in my medical decision making (see chart for details).    ____________________________________________   FINAL CLINICAL IMPRESSION(S) / ED DIAGNOSES  Final diagnoses:  2nd degree AV block  Acute pulmonary edema (HCC)  Acute respiratory failure with hypoxia (HCC)      NEW MEDICATIONS STARTED DURING THIS VISIT:  ED Discharge Orders    None       Note:  This document was prepared using Dragon voice recognition software and may include unintentional dictation errors.    Don PerkingVeronese, [place], MD 06/29/18 [phone]

## 2018-06-30 ENCOUNTER — Encounter
Admission: EM | Disposition: A | Discharge status: Home / Self Care | Payer: Self-pay | Source: Home / Self Care | Attending: Internal Medicine

## 2018-06-30 ENCOUNTER — Encounter: Payer: Self-pay | Admitting: Anesthesiology

## 2018-06-30 ENCOUNTER — Inpatient Hospital Stay: Payer: Medicare Other

## 2018-06-30 ENCOUNTER — Inpatient Hospital Stay: Payer: Medicare Other | Admitting: Registered Nurse

## 2018-06-30 ENCOUNTER — Inpatient Hospital Stay
Admit: 2018-06-30 | Discharge: 2018-06-30 | Disposition: A | Discharge status: Home / Self Care | Payer: Medicare Other | Attending: Internal Medicine | Admitting: Internal Medicine

## 2018-06-30 DIAGNOSIS — I441 Atrioventricular block, second degree: Principal | ICD-10-CM

## 2018-06-30 DIAGNOSIS — I509 Heart failure, unspecified: Secondary | ICD-10-CM

## 2018-06-30 HISTORY — PX: PACEMAKER INSERTION: SHX728

## 2018-06-30 LAB — BASIC METABOLIC PANEL
Anion gap: 8 (ref 5–15)
BUN: 18 mg/dL (ref 8–23)
CO2: 32 mmol/L (ref 22–32)
Calcium: 8.6 mg/dL — ABNORMAL LOW (ref 8.9–10.3)
Chloride: 98 mmol/L (ref 98–111)
Creatinine, Ser: 0.52 mg/dL (ref 0.44–1.00)
GFR calc Af Amer: >60 mL/min (ref >60)
GFR calc non Af Amer: >60 mL/min (ref >60)
Glucose, Bld: 126 mg/dL — ABNORMAL HIGH (ref 70–99)
Potassium: 4 mmol/L (ref 3.5–5.1)
Sodium: 138 mmol/L (ref 135–145)

## 2018-06-30 LAB — TROPONIN I
Troponin I: <0.03 ng/mL (ref <0.03)
Troponin I: 0.03 ng/mL (ref <0.03)

## 2018-06-30 LAB — CBC
HCT: 35.9 % — ABNORMAL LOW (ref 36.0–46.0)
Hemoglobin: 11.3 g/dL — ABNORMAL LOW (ref 12.0–15.0)
MCH: 30.9 pg (ref 26.0–34.0)
MCHC: 31.5 g/dL (ref 30.0–36.0)
MCV: 98.1 fL (ref 80.0–100.0)
Platelets: 294 10*3/uL (ref 150–400)
RBC: 3.66 MIL/uL — ABNORMAL LOW (ref 3.87–5.11)
RDW: 14.2 % (ref 11.5–15.5)
WBC: 8.5 10*3/uL (ref 4.0–10.5)
nRBC: 0 % (ref 0.0–0.2)

## 2018-06-30 LAB — BRAIN NATRIURETIC PEPTIDE: B Natriuretic Peptide: 525 pg/mL — ABNORMAL HIGH (ref 0.0–100.0)

## 2018-06-30 SURGERY — INSERTION, CARDIAC PACEMAKER
Anesthesia: General

## 2018-06-30 MED ORDER — IOPAMIDOL (ISOVUE-300) INJECTION 61%
INTRAVENOUS | Status: DC | PRN
  Administered 2018-06-30: 20 mL via INTRAVENOUS

## 2018-06-30 MED ORDER — LACTATED RINGERS IV SOLN
INTRAVENOUS | Status: DC | PRN
  Administered 2018-06-30: 12:00:00 via INTRAVENOUS

## 2018-06-30 MED ORDER — FUROSEMIDE 10 MG/ML IJ SOLN
20.0000 mg | Freq: Once | INTRAMUSCULAR | Status: AC
  Administered 2018-06-30: 20 mg via INTRAVENOUS
  Filled 2018-06-30: qty 2

## 2018-06-30 MED ORDER — FENTANYL CITRATE (PF) 100 MCG/2ML IJ SOLN
INTRAMUSCULAR | Status: AC
  Administered 2018-06-30: 50 ug via INTRAVENOUS
  Filled 2018-06-30: qty 2

## 2018-06-30 MED ORDER — OXYCODONE HCL 5 MG PO TABS: 5.0000 mg | ORAL_TABLET | Freq: Once | ORAL | Status: DC | PRN

## 2018-06-30 MED ORDER — LIDOCAINE HCL (PF) 2 % IJ SOLN
INTRAMUSCULAR | Status: AC
  Filled 2018-06-30: qty 10

## 2018-06-30 MED ORDER — CEFAZOLIN SODIUM-DEXTROSE 1-4 GM/50ML-% IV SOLN
1.0000 g | Freq: Four times a day (QID) | INTRAVENOUS | Status: AC
  Administered 2018-06-30 – 2018-07-01 (×3): 1 g via INTRAVENOUS
  Filled 2018-06-30 (×3): qty 50

## 2018-06-30 MED ORDER — FENTANYL CITRATE (PF) 100 MCG/2ML IJ SOLN
25.0000 ug | INTRAMUSCULAR | Status: DC | PRN
  Administered 2018-06-30 (×4): 50 ug via INTRAVENOUS

## 2018-06-30 MED ORDER — SODIUM CHLORIDE 0.9 % IV SOLN: INTRAVENOUS | Status: DC

## 2018-06-30 MED ORDER — SODIUM CHLORIDE 0.9 % IV SOLN
INTRAVENOUS | Status: DC | PRN
  Administered 2018-06-30: 13:00:00 360 mL

## 2018-06-30 MED ORDER — PROPOFOL 500 MG/50ML IV EMUL
INTRAVENOUS | Status: AC
  Filled 2018-06-30: qty 50

## 2018-06-30 MED ORDER — LIDOCAINE 1 % OPTIME INJ - NO CHARGE
INTRAMUSCULAR | Status: DC | PRN
  Administered 2018-06-30: 13:00:00 29 mL

## 2018-06-30 MED ORDER — LIDOCAINE HCL URETHRAL/MUCOSAL 2 % EX GEL
CUTANEOUS | Status: DC | PRN
  Administered 2018-06-30: 1 via TOPICAL

## 2018-06-30 MED ORDER — PROPOFOL 500 MG/50ML IV EMUL
INTRAVENOUS | Status: DC | PRN
  Administered 2018-06-30: 100 ug/kg/min via INTRAVENOUS

## 2018-06-30 MED ORDER — ACETAMINOPHEN 325 MG PO TABS: 325.0000 mg | ORAL_TABLET | ORAL | Status: DC | PRN

## 2018-06-30 MED ORDER — AMLODIPINE BESYLATE 10 MG PO TABS
10.0000 mg | ORAL_TABLET | Freq: Every day | ORAL | Status: DC
  Administered 2018-06-30 – 2018-07-01 (×2): 10 mg via ORAL
  Filled 2018-06-30 (×2): qty 1

## 2018-06-30 MED ORDER — CHLORHEXIDINE GLUCONATE 4 % EX LIQD: 60.0000 mL | Freq: Once | CUTANEOUS | Status: DC

## 2018-06-30 MED ORDER — OXYCODONE HCL 5 MG/5ML PO SOLN: 5.0000 mg | Freq: Once | ORAL | Status: DC | PRN

## 2018-06-30 MED ORDER — ONDANSETRON HCL 4 MG/2ML IJ SOLN: 4.0000 mg | Freq: Four times a day (QID) | INTRAMUSCULAR | Status: DC | PRN

## 2018-06-30 MED ORDER — PROPOFOL 10 MG/ML IV BOLUS
INTRAVENOUS | Status: AC
  Filled 2018-06-30: qty 20

## 2018-06-30 MED ORDER — SODIUM CHLORIDE FLUSH 0.9 % IV SOLN
INTRAVENOUS | Status: AC
  Filled 2018-06-30: qty 10

## 2018-06-30 MED ORDER — SODIUM CHLORIDE 0.9 % IV SOLN
80.0000 mg | INTRAVENOUS | Status: DC
  Filled 2018-06-30: qty 2

## 2018-06-30 MED ORDER — PROPOFOL 10 MG/ML IV BOLUS
INTRAVENOUS | Status: DC | PRN
  Administered 2018-06-30: 30 mg via INTRAVENOUS
  Administered 2018-06-30 (×2): 10 mg via INTRAVENOUS

## 2018-06-30 MED ORDER — CEFAZOLIN SODIUM-DEXTROSE 2-4 GM/100ML-% IV SOLN
2.0000 g | INTRAVENOUS | Status: AC
  Administered 2018-06-30: 2 g via INTRAVENOUS
  Filled 2018-06-30: qty 100

## 2018-06-30 SURGICAL SUPPLY — 41 items
BAG DECANTER FOR FLEXI CONT (MISCELLANEOUS) ×3 IMPLANT
BRUSH SCRUB EZ  4% CHG (MISCELLANEOUS) ×2
BRUSH SCRUB EZ 4% CHG (MISCELLANEOUS) ×1 IMPLANT
CABLE SURG 12 DISP A/V CHANNEL (MISCELLANEOUS) ×6 IMPLANT
CANISTER SUCT 1200ML W/VALVE (MISCELLANEOUS) ×3 IMPLANT
CHLORAPREP W/TINT 26 (MISCELLANEOUS) ×3 IMPLANT
COVER LIGHT HANDLE STERIS (MISCELLANEOUS) ×6 IMPLANT
COVER MAYO STAND STRL (DRAPES) ×3 IMPLANT
COVER WAND RF STERILE (DRAPES) ×3 IMPLANT
DERMABOND ADVANCED (GAUZE/BANDAGES/DRESSINGS) ×2
DERMABOND ADVANCED .7 DNX12 (GAUZE/BANDAGES/DRESSINGS) ×1 IMPLANT
DRAPE C-ARM XRAY 36X54 (DRAPES) ×3 IMPLANT
DRSG TEGADERM 4X4.75 (GAUZE/BANDAGES/DRESSINGS) ×3 IMPLANT
DRSG TELFA 4X3 1S NADH ST (GAUZE/BANDAGES/DRESSINGS) ×3 IMPLANT
ELECT REM PT RETURN 9FT ADLT (ELECTROSURGICAL) ×3
ELECTRODE REM PT RTRN 9FT ADLT (ELECTROSURGICAL) ×1 IMPLANT
GLIDEWIRE STIFF .35X180X3 HYDR (WIRE) IMPLANT
GLOVE BIO SURGEON STRL SZ7.5 (GLOVE) ×3 IMPLANT
GLOVE BIO SURGEON STRL SZ8 (GLOVE) ×3 IMPLANT
GOWN STRL REUS W/ TWL LRG LVL3 (GOWN DISPOSABLE) ×1 IMPLANT
GOWN STRL REUS W/ TWL XL LVL3 (GOWN DISPOSABLE) ×1 IMPLANT
GOWN STRL REUS W/TWL LRG LVL3 (GOWN DISPOSABLE) ×2
GOWN STRL REUS W/TWL XL LVL3 (GOWN DISPOSABLE) ×2
IMMOBILIZER SHDR MD LX WHT (SOFTGOODS) IMPLANT
IMMOBILIZER SHDR XL LX WHT (SOFTGOODS) ×3 IMPLANT
INTRO PACEMAKR LEAD 9FR 13CM (INTRODUCER) ×3
INTRO PACEMKR SHEATH II 7FR (MISCELLANEOUS) ×3
INTRODUCER PACEMKR LD 9FR 13CM (INTRODUCER) ×1 IMPLANT
INTRODUCER PACEMKR SHTH II 7FR (MISCELLANEOUS) ×1 IMPLANT
IPG PACE AZUR XT DR MRI W1DR01 (Pacemaker) ×1 IMPLANT
IV NS 500ML (IV SOLUTION) ×2
IV NS 500ML BAXH (IV SOLUTION) ×1 IMPLANT
KIT TURNOVER KIT A (KITS) ×3 IMPLANT
LABEL OR SOLS (LABEL) ×3 IMPLANT
LEAD CAPSURE NOVUS 5076-52CM (Lead) ×3 IMPLANT
MARKER SKIN DUAL TIP RULER LAB (MISCELLANEOUS) ×3 IMPLANT
PACE AZURE XT DR MRI W1DR01 (Pacemaker) ×3 IMPLANT
PACEMAKER LEAD ATRL (Lead) ×3 IMPLANT
PACK PACE INSERTION (MISCELLANEOUS) ×3 IMPLANT
PAD ONESTEP ZOLL R SERIES ADT (MISCELLANEOUS) ×3 IMPLANT
SUT SILK 0 SH 30 (SUTURE) ×9 IMPLANT

## 2018-06-30 NOTE — Progress Notes (Signed)
I returned patient to ICU and she stated that she was not in pain and was ready to eat. While talking to the handoff nurse I noticed that she was having a couple of misfires after not having any for approximately one hour. The nurse stated that she will monitor the patient and that she will alert the doctor if she has a problem.

## 2018-06-30 NOTE — Anesthesia Preprocedure Evaluation (Signed)
Anesthesia Evaluation  Patient identified by MRN, date of birth, ID band Patient awake    Reviewed: Allergy & Precautions, H&P , NPO status , Patient's Chart, lab work & pertinent test results  History of Anesthesia Complications (+) history of anesthetic complications (patient reports allergy to etomidate but no problems with propofol )  Airway Mallampati: II  TM Distance: <3 FB Neck ROM: limited    Dental  (+) Poor Dentition, Missing, Lower Dentures, Upper Dentures   Pulmonary neg shortness of breath,           Cardiovascular hypertension, +CHF and + DOE  + dysrhythmias      Neuro/Psych negative neurological ROS  negative psych ROS   GI/Hepatic negative GI ROS, Neg liver ROS, neg GERD  ,  Endo/Other  negative endocrine ROS  Renal/GU negative Renal ROS  negative genitourinary   Musculoskeletal  (+) Arthritis ,   Abdominal   Peds  Hematology negative hematology ROS (+)   Anesthesia Other Findings Past Medical History: No date: Arthritis     Comment:  left knee and left hand in particular No date: Hypertension  History reviewed. No pertinent surgical history.  BMI    Body Mass Index:  42.45 kg/m      Reproductive/Obstetrics negative OB ROS                             Anesthesia Physical Anesthesia Plan  ASA: IV  Anesthesia Plan: General   Post-op Pain Management:    Induction: Intravenous  PONV Risk Score and Plan: Propofol infusion and TIVA  Airway Management Planned: Natural Airway and Nasal Cannula  Additional Equipment:   Intra-op Plan:   Post-operative Plan:   Informed Consent: I have reviewed the patients History and Physical, chart, labs and discussed the procedure including the risks, benefits and alternatives for the proposed anesthesia with the patient or authorized representative who has indicated his/her understanding and acceptance.     Dental  Advisory Given  Plan Discussed with: Anesthesiologist, CRNA and Surgeon  Anesthesia Plan Comments: (Patient consented for risks of anesthesia including but not limited to:  - adverse reactions to medications - risk of intubation if required - damage to teeth, lips or other oral mucosa - sore throat or hoarseness - Damage to heart, brain, lungs or loss of life  Patient voiced understanding.)        Anesthesia Quick Evaluation

## 2018-06-30 NOTE — Progress Notes (Signed)
[facility] [place] [facility]   PATIENT NAME: Tiffany Barber    MR#:  [id number]  DATE OF BIRTH:  08/11/1940  SUBJECTIVE: Patient admitted for acute respiratory failure with hypoxia due to CHF exacerbation.  This morning noted to have bradycardia, says that shortness of breath improved and denies any chest pain.  CHIEF COMPLAINT:   Chief Complaint  Patient presents with  . Shortness of Breath    REVIEW OF SYSTEMS:   ROS CONSTITUTIONAL: No fever, fatigue or weakness.  EYES: No blurred or double vision.  EARS, NOSE, AND THROAT: No tinnitus or ear pain.  RESPIRATORY: No cough, shortness of breath, wheezing or hemoptysis.  CARDIOVASCULAR: No chest pain, orthopnea, edema.  GASTROINTESTINAL: No nausea, vomiting, diarrhea or abdominal pain.  GENITOURINARY: No dysuria, hematuria.  ENDOCRINE: No polyuria, nocturia,  HEMATOLOGY: No anemia, easy bruising or bleeding SKIN: No rash or lesion. MUSCULOSKELETAL: No joint pain or arthritis.   NEUROLOGIC: No tingling, numbness, weakness.  PSYCHIATRY: No anxiety or depression.   DRUG ALLERGIES:   Allergies  Allergen Reactions  . Ace Inhibitors Other (See Comments)    Other reaction(s): angioedema resulting in intubation Other reaction(s): angioedema resulting in intubation   . Nifedipine Swelling and Shortness Of Breath  . Lisinopril     VITALS:  Blood pressure (!) 146/43, pulse (!) 41, temperature 98.7 F (37.1 C), temperature source Axillary, resp. rate (!) 21, height 5' (1.524 m), weight 98.6 kg, SpO2 96 %.  PHYSICAL EXAMINATION:  GENERAL:  78 y.o.-year-old patient lying in the bed with no acute distress.  EYES: Pupils equal, round, reactive to light and accommodation. No scleral icterus. Extraocular muscles intact.  HEENT: Head atraumatic, normocephalic. Oropharynx and nasopharynx clear.  NECK:  Supple, no jugular venous distention. No thyroid enlargement, no tenderness.  LUNGS: Normal breath sounds  bilaterally, no wheezing, rales,rhonchi or crepitation. No use of accessory muscles of respiration.  CARDIOVASCULAR: S1, S2 normal. No murmurs, rubs, or gallops.  ABDOMEN: Soft, nontender, nondistended. Bowel sounds present. No organomegaly or mass.  EXTREMITIES: No pedal edema, cyanosis, or clubbing.  NEUROLOGIC: Cranial nerves II through XII are intact. Muscle strength 5/5 in all extremities. Sensation intact. Gait not checked.  PSYCHIATRIC: The patient is alert and oriented x 3.  SKIN: No obvious rash, lesion, or ulcer.    LABORATORY PANEL:   CBC Recent Labs  Lab 06/30/18 0230  WBC 8.5  HGB 11.3*  HCT 35.9*  PLT 294   ------------------------------------------------------------------------------------------------------------------  Chemistries  Recent Labs  Lab 06/29/18 1636 06/30/18 0230  NA 138 138  K 4.1 4.0  CL 97* 98  CO2 30 32  GLUCOSE 154* 126*  BUN 22 18  CREATININE 0.40* 0.52  CALCIUM 8.9 8.6*  MG 2.9*  --   AST 98*  --   ALT 101*  --   ALKPHOS 116  --   BILITOT 1.0  --    ------------------------------------------------------------------------------------------------------------------  Cardiac Enzymes Recent Labs  Lab 06/30/18 0822  TROPONINI 0.03*   ------------------------------------------------------------------------------------------------------------------  RADIOLOGY:  Dg Chest Portable 1 View  Result Date: 06/29/2018 CLINICAL DATA:  Cough.  Low oxygen saturation. EXAM: PORTABLE CHEST 1 VIEW COMPARISON:  09/25/2017 FINDINGS: Heart is enlarged. The aorta is tortuous. There is newly seen widespread bilateral pulmonary density. The pattern is nonspecific and could represent pneumonia or heart failure. Possible small amount of pleural effusion on each side. No significant bone finding. IMPRESSION: Cardiomegaly. Widespread abnormal lung density. The differential diagnosis is heart failure versus pneumonia. Electronically Signed  By: Paulina Fusi  M.D.   On: 06/29/2018 17:03    EKG:   Orders placed or performed during the hospital encounter of 06/29/18  . EKG 12-Lead  . EKG 12-Lead    ASSESSMENT AND PLAN:  78 year old female history of essential hypertension admitted for CHF exacerbation acute respiratory failure with hypoxia secondary to CHF exacerbation,; systolic or diastolic, follow echocardiogram, received IV Lasix, on HCTZ. 2. sinus bradycardia, continue the clonidine, patient scheduled for possible pacemaker placement.  Follow echocardiogram.  All the records are reviewed and case discussed with Care Management/Social Workerr. Management plans discussed with the patient, family and they are in agreement.  CODE STATUS: Full code  TOTAL TIME TAKING CARE OF THIS PATIENT: 38 minutes.   POSSIBLE D/C IN 1-2 DAYS, DEPENDING ON CLINICAL CONDITION.  50% time spent in counseling, coordination of care Katha Hamming M.D on 06/30/2018 at 11:27 AM  Between 7am to 6pm - Pager - [phone]  After 6pm go to [email or web address] - password [email or web address] [facility]  [place]  [facility]  Office  [phone]  CC: Primary care physician; Leanna Sato, MD   Note: This dictation was prepared with Dragon dictation along with smaller phrase technology. Any transcriptional errors that result from this process are unintentional.

## 2018-06-30 NOTE — Op Note (Signed)
[facility] Cardiology   06/30/2018                     1:50 PM  PATIENT:  Tiffany Barber    PRE-OPERATIVE DIAGNOSIS:  second degree heart block  POST-OPERATIVE DIAGNOSIS:  Same  PROCEDURE:  INSERTION PACEMAKER  SURGEON:  Marcina Millard, MD    ANESTHESIA:     PREOPERATIVE INDICATIONS:  Tiffany Barber is a  78 y.o. female with a diagnosis of second degree heart block who failed conservative measures and elected for surgical management.    The risks benefits and alternatives were discussed with the patient preoperatively including but not limited to the risks of infection, bleeding, cardiopulmonary complications, the need for revision surgery, among others, and the patient was willing to proceed.   OPERATIVE PROCEDURE: The patient was brought to the operating room in a fasting state.  The left pectoral region was prepped and draped in usual sterile manner.  Anesthesia was obtained 1% lidocaine locally.  A 6 cm incision was performed the left pectoral region.  The pacemaker pocket was generated electrocautery and blunt dissection.  Access was obtained to the left subclavian vein by fine-needle aspiration.  MRI compatible leads were positioned to the right ventricular apical septum ( Medtronic [id number] ) and right atrial appendage ( Medtronic [id number] ) under fluoroscopic guidance.  After proper thresholds were obtained the leads were sutured in place.  The leads were connected to a MRI compatible dual-chamber rate responsive pacemaker generator ( Medtronic [id number] ).  The pacemaker pocket was irrigated gentamicin solution.  The pacemaker generator was positioned into the pocket and the pocket was closed with 2-0 and 4-0 Vicryl, respectively.  Steri-Strips and pressure dressing were applied.  Postprocedural interrogation revealed appropriate dual-chamber atrial and ventricular sensing and pacing thresholds.  There were no periprocedural complications.

## 2018-06-30 NOTE — Progress Notes (Signed)
Patient with marked bradycardia secondary to 2-1 AV block, requires dual-chamber pacemaker.

## 2018-06-30 NOTE — Progress Notes (Signed)
Patient is exhibiting pauses and failure to capture beats after receiving a pacemaker and pain under the left side of her breast below the pacemaker. Dr. Darrold Junker was contacted and he came to PACU to see the strips of the noncaptures. He called the representative of the pacemaker and they stated that they would come to PACU to view the strips. After watching the monitor the patient started to have more pauses and noncaptures and we decided to contact Dr. Darrold Junker again to alert him of the increase frequencies of pauses and noncaptures and he stated to let the representative know what was going on. I will continue to monitor patient while she is under my care in the PACU.

## 2018-06-30 NOTE — Progress Notes (Signed)
Patient returned from PACU at 1743.Still having come pauses on the monitor. Denies pain and is asking for tea and a menu because she wants something to eat.

## 2018-06-30 NOTE — Progress Notes (Signed)
   06/30/18 0700  Clinical Encounter Type  Visited With Patient  Visit Type Initial;Spiritual support  Referral From Nurse  Consult/Referral To Chaplain  Spiritual Encounters  Spiritual Needs Emotional;Other (Comment)  Chaplain received page that patient would like a bible. Chaplain arrived at patient room and patient was lying in bed. Chaplain introduced herself and patient was happy to see her. Chaplain allowed patient to express her feelings about her relationship with God and offered encouraging words of comfort. Patient faith is very strong.

## 2018-06-30 NOTE — TOC Initial Note (Signed)
Transition of Care [facility]) - Initial/Assessment Note    Patient Details  Name: Tiffany Barber MRN: [id number] Date of Birth: 1940-11-14  Transition of Care [facility]) CM/SW Contact:    Allayne Butcher, RN Phone Number: 06/30/2018, 3:21 PM  Clinical Narrative:                 RNCM received consult for heart failure home health screen.  Patient is currently off the floor in the cath lab getting a pacemaker placed.  RNCM will attempt to assess patient again tomorrow.   Expected Discharge Plan: Home/Self Care Barriers to Discharge: Continued Medical Work up   Patient Goals and CMS Choice        Expected Discharge Plan and Services Expected Discharge Plan: Home/Self Care                                              Prior Living Arrangements/Services   Lives with:: Self                   Activities of Daily Living      Permission Sought/Granted                  Emotional Assessment              Admission diagnosis:  Acute pulmonary edema (HCC) [J81.0] 2nd degree AV block [I44.1] Acute respiratory failure with hypoxia (HCC) [J96.01] CHF (congestive heart failure) (HCC) [I50.9] Patient Active Problem List   Diagnosis Date Noted  . CHF (congestive heart failure) (HCC) 06/29/2018  . Hypertension 12/01/2014  . Degenerative arthritis 11/21/2014   PCP:  Leanna Sato, MD Pharmacy:   [facility] [address] ([address]), [place] - [address] [place]) [place] [address] Phone: [phone] Fax: [phone]     Social Determinants of Health (SDOH) Interventions    Readmission Risk Interventions No flowsheet data found.

## 2018-06-30 NOTE — Progress Notes (Signed)
PT Cancellation Note  Patient Details Name: Caydin Alejos MRN: [id number] DOB: Nov 07, 1940   Cancelled Treatment:    Reason Eval/Treat Not Completed: Patient not medically ready;Other (comment)(Per cardiology patient not medically ready for PT consult at this time. Please add new PT order once medically appropriate. )  Precious Bard, PT, DPT   06/30/2018, 8:39 AM

## 2018-06-30 NOTE — Progress Notes (Signed)
*  PRELIMINARY RESULTS* Echocardiogram 2D Echocardiogram has been performed.  Tiffany Barber 06/30/2018, 9:09 AM

## 2018-06-30 NOTE — OR Nursing (Signed)
Pt arrived to OR with two clear stone earrings in ears. Earrings removed after induction and placed in clear biohazard bag with patient label. Bag placed in patient chart.   Beather Arbour RN BSN

## 2018-06-30 NOTE — Progress Notes (Signed)
Dr. Darrold Junker along with the pacemaker representative to test the pacemaker. They both checked the pacemaker and it did not misfire or pause while the representative was working with it. They both looked at the xray and decided that it looked like it was placed correctly so I was alerted to return the patient back to ICU.

## 2018-06-30 NOTE — Consult Note (Signed)
Cardiology Consultation Note    Patient ID: Tiffany Barber, MRN: [id number], DOB/AGE: 79/02/1940 79 y.o. Admit date: 06/29/2018   Date of Consult: 06/30/2018 Primary Physician: Leanna SatoMiles, Linda M, MD Primary Cardiologist: Dr. Darrold JunkerParaschos  Chief Complaint: sob Reason for Consultation: bradycardia Requesting MD: Dr. Luberta MutterKonidena  HPI: Tiffany PinesViolet Barber is a 78 y.o. 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  She was hemodynamically stable.  She denied any chest pain dizziness lightheadedness or syncope.  She had an echocardiogram in September 2019 showing preserved LV function with moderate concentric LVH and trivial valvular insufficiency.  Her electrocardiogram at that time showed sinus rhythm with right bundle branch block and left anterior fascicular block.  She had a functional study in 2017 showing no ischemia.  She denies any chest pain.  She is on no AV nodal  medications although was on clonidine at 0.1 mg twice daily as an outpatient.  She was last seen in cardiology in March 2020.  Chest x-ray on presentation to the hospital was read as showing bilateral pulmonary density which was nonspecific consistent with CHF versus pneumonia.  She is currently lying flat in bed with no shortness of breath.  She is hemodynamically stable. She is afebrile. Pulse ox on 2 liters is 98-100%.   Past Medical History:  Diagnosis Date  . 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 Hypertension       Surgical History: History reviewed. No pertinent surgical history.   Home Meds: Prior to Admission medications   Medication Sig Start Date End Date Taking? Authorizing Provider  amLODipine (NORVASC) 10 MG tablet Take 10 mg by mouth daily.   Yes [provider]  amoxicillin (AMOXIL) 875 MG tablet Take 875 mg by mouth 2  (two) times a day. 06/28/18  Yes [provider]  aspirin 325 MG tablet Take 325 mg by mouth daily.   Yes [provider]  cloNIDine (CATAPRES) 0.1 MG tablet Take 0.1 mg by mouth 2 (two) times daily. 05/23/14  Yes [provider]  famotidine (PEPCID) 20 MG tablet Take 20 mg by mouth 2 (two) times a day. 04/20/18  Yes [provider]  hydrochlorothiazide (HYDRODIURIL) 25 MG tablet Take 25 mg by mouth daily. 05/23/14  Yes [provider]  lovastatin (MEVACOR) 20 MG tablet Take 1 tablet by mouth daily. 06/29/17  Yes [provider]  albuterol (VENTOLIN HFA) 108 (90 Base) MCG/ACT inhaler Inhale 2 puffs into the lungs every 4 (four) hours as needed for wheezing. 06/22/18   [provider]  benzonatate (TESSALON) 100 MG capsule Take 100 mg by mouth 3 (three) times daily as needed for cough. 06/22/18   [provider]  fluticasone (FLONASE) 50 MCG/ACT nasal spray Place 2 sprays into both nostrils daily. 06/29/17   [provider]  meclizine (ANTIVERT) 25 MG tablet Take 1 tablet (25 mg total) by mouth 3 (three) times daily as needed for dizziness. Patient not taking: Reported on 06/29/2018 09/28/17   Sharman CheekStafford, Phillip, MD  meloxicam (MOBIC) 15 MG tablet Take 15 mg by mouth daily. 05/23/14   [provider]  NYSTATIN powder Apply 1 application topically 4 (four) times daily. 04/20/18   [provider]  traMADol (ULTRAM) 50 MG tablet Take 1 tablet by mouth at bedtime.  07/14/17  [provider]    Inpatient Medications:  . aspirin  325 mg Oral Daily  . Chlorhexidine Gluconate Cloth  6 each Topical Daily  . cloNIDine  0.1 mg Oral BID  . enoxaparin (LOVENOX) injection  40 mg Subcutaneous Q24H  . famotidine  20 mg Oral BID  . feeding supplement (ENSURE ENLIVE)  237 mL Oral BID BM  . hydrochlorothiazide  25 mg Oral Daily  . mouth rinse  15 mL Mouth Rinse BID  . sodium chloride flush  3 mL Intravenous Q12H  . traMADol   50 mg Oral QHS   . sodium chloride      Allergies:  Allergies  Allergen Reactions  . Ace Inhibitors Other (See Comments)    Other reaction(s): angioedema resulting in intubation Other reaction(s): angioedema resulting in intubation   . Nifedipine Swelling and Shortness Of Breath  . Lisinopril     Social History   Socioeconomic History  . Marital status: Widowed    Spouse name: Not on file  . Number of children: Not on file  . Years of education: Not on file  . Highest education level: Not on file  Occupational History  . Not on file  Social Needs  . Financial resource strain: Not on file  . Food insecurity:    Worry: Not on file    Inability: Not on file  . Transportation needs:    Medical: Not on file    Non-medical: Not on file  Tobacco Use  . Smoking status: Never Smoker  . Smokeless tobacco: Never Used  Substance and Sexual Activity  . Alcohol use: No  . Drug use: No  . Sexual activity: Not on file  Lifestyle  . Physical activity:    Days per week: Not on file    Minutes per session: Not on file  . Stress: Not on file  Relationships  . Social connections:    Talks on phone: Not on file    Gets together: Not on file    Attends religious service: Not on file    Active member of club or organization: Not on file    Attends meetings of clubs or organizations: Not on file    Relationship status: Not on file  . Intimate partner violence:    Fear of current or ex partner: Not on file    Emotionally abused: Not on file    Physically abused: Not on file    Forced sexual activity: Not on file  Other Topics Concern  . Not on file  Social History Narrative  . Not on file     Family History  Problem Relation Age of Onset  . Hypertension Mother   . Diabetes Daughter   . Hyperlipidemia Daughter      Review of Systems: A 12-system review of systems was performed and is negative except as noted in the HPI.  Labs: Recent Labs    06/29/18 1624  06/29/18 2043 06/30/18 0230  TROPONINI 0.03* 0.03* <0.03   Lab Results  Component Value Date   WBC 8.5 06/30/2018   HGB 11.3 (L) 06/30/2018   HCT 35.9 (L) 06/30/2018   MCV 98.1 06/30/2018   PLT 294 06/30/2018    Recent Labs  Lab 06/29/18 1636 06/30/18 0230  NA 138 138  K 4.1 4.0  CL 97* 98  CO2 30 32  BUN 22 18  CREATININE 0.40* 0.52  CALCIUM 8.9 8.6*  PROT 7.3  --   BILITOT 1.0  --  ALKPHOS 116  --   ALT 101*  --   AST 98*  --   GLUCOSE 154* 126*   Lab Results  Component Value Date   CHOL 193 11/15/2014   HDL 59 11/15/2014   LDLCALC 114 11/15/2014   TRIG 99 11/15/2014   No results found for: DDIMER  Radiology/Studies:  Dg Chest Portable 1 View  Result Date: 06/29/2018 CLINICAL DATA:  Cough.  Low oxygen saturation. EXAM: PORTABLE CHEST 1 VIEW COMPARISON:  09/25/2017 FINDINGS: Heart is enlarged. The aorta is tortuous. There is newly seen widespread bilateral pulmonary density. The pattern is nonspecific and could represent pneumonia or heart failure. Possible small amount of pleural effusion on each side. No significant bone finding. IMPRESSION: Cardiomegaly. Widespread abnormal lung density. The differential diagnosis is heart failure versus pneumonia. Electronically Signed   By: Paulina Fusi M.D.   On: 06/29/2018 17:03    Wt Readings from Last 3 Encounters:  06/30/18 98.6 kg  09/28/17 88.5 kg  09/25/17 88.5 kg    EKG: second degree heart block.   Physical Exam:  Blood pressure (!) 146/43, pulse (!) 41, temperature 98.7 F (37.1 C), temperature source Axillary, resp. rate (!) 21, height 5' (1.524 m), weight 98.6 kg, SpO2 96 %. Body mass index is 42.45 kg/m. General: Well developed, well nourished, in no acute distress. Head: Normocephalic, atraumatic, sclera non-icteric, no xanthomas, nares are without discharge.  Neck: Negative for carotid bruits. JVD not elevated. Lungs: Clear bilaterally to auscultation without wheezes, rales, or rhonchi. Breathing is  unlabored. Heart: RRR with S1 S2. No murmurs, rubs, or gallops appreciated. Abdomen: Soft, non-tender, non-distended with normoactive bowel sounds. No hepatomegaly. No rebound/guarding. No obvious abdominal masses. Msk:  Strength and tone appear normal for age. Extremities: No clubbing or cyanosis. No edema.  Distal pedal pulses are 2+ and equal bilaterally. Neuro: Alert and oriented X 3. No facial asymmetry. No focal deficit. Moves all extremities spontaneously. Psych:  Responds to questions appropriately with a normal affect.     Assessment and Plan  78 year old female with history of hypertension on clonidine 0.1 twice daily and amlodipine who presented to the hospital with complaints of shortness of breath.  Has what appeared to be diffuse infiltrates on her chest x-ray consistent with possible pulmonary edema.  She was also noted to be bradycardic with second-degree heart block.  Heart rate has been in the upper 30s to lower 40s since admission.  She denies syncope or lightheadedness.  Her BNP is 525 today up from 306.  She has ruled out for myocardial infarction with normal troponin.  White count is normal.  Hemoglobin hematocrit were normal.  Renal function was normal with a GFR greater than 60.  Etiology of her high-grade heart block is unclear.  This may be playing a role in her volume overload however she is improving with diuresis.  Clonidine may be playing a role as well and will discontinue this however given the lack of AV nodal blockers, most likely patient will need a permanent pacemaker due to her second-degree heart block.  Differential and the heart block could be 2-1 winky block versus second-degree heart block with infranodal block.  Will temporarily keep n.p.o. and consider permanent pacemaker.  Will discontinue clonidine and continue diuresis.  Echo is pending.  Patient did have a normal LV function approximately 1 year ago however.  Signed, Dalia Heading MD 06/30/2018, 7:54  AM Pager: [phone]

## 2018-06-30 NOTE — Anesthesia Post-op Follow-up Note (Signed)
Anesthesia QCDR form completed.        

## 2018-06-30 NOTE — Progress Notes (Signed)
No distress on [place] O2 Remains bradycardic with type II second-degree heart block Labs were reviewed and unremarkable Clonidine on hold She was not on any negative chronotropic medications For PPM placement per cardiology Echocardiogram performed this morning, results pending  Billy Fischer, MD PCCM service Mobile [phone] Pager [phone] 06/30/2018 12:06 PM

## 2018-06-30 NOTE — Transfer of Care (Addendum)
Immediate Anesthesia Transfer of Care Note  Patient: Rainey Pines  Procedure(s) Performed: Procedure(s): INSERTION PACEMAKER (N/A)  Patient Location: PACU  Anesthesia Type:General  Level of Consciousness: drowsy  Airway & Oxygen Therapy: Patient Spontanous Breathing and Patient connected to face mask oxygen  Post-op Assessment: Report given to RN and Post -op Vital signs reviewed and stable  Post vital signs: Reviewed and stable  Last Vitals:  Vitals:   06/30/18 1145 06/30/18 1351  BP: (!) 183/48 (!) 173/72  Pulse:  76  Resp:  10  Temp:    SpO2:  97%    Complications: No apparent anesthesia complications

## 2018-07-01 ENCOUNTER — Encounter: Payer: Self-pay | Admitting: Cardiology

## 2018-07-01 LAB — BASIC METABOLIC PANEL WITH GFR
Anion gap: 8 (ref 5–15)
BUN: 16 mg/dL (ref 8–23)
CO2: 32 mmol/L (ref 22–32)
Calcium: 8.7 mg/dL — ABNORMAL LOW (ref 8.9–10.3)
Chloride: 99 mmol/L (ref 98–111)
Creatinine, Ser: 0.52 mg/dL (ref 0.44–1.00)
GFR calc Af Amer: >60 mL/min
GFR calc non Af Amer: >60 mL/min
Glucose, Bld: 98 mg/dL (ref 70–99)
Potassium: 3.8 mmol/L (ref 3.5–5.1)
Sodium: 139 mmol/L (ref 135–145)

## 2018-07-01 LAB — CBC WITH DIFFERENTIAL/PLATELET
Abs Immature Granulocytes: 0.03 10*3/uL (ref 0.00–0.07)
Basophils Absolute: 0 10*3/uL (ref 0.0–0.1)
Basophils Relative: 0 %
Eosinophils Absolute: 0.5 10*3/uL (ref 0.0–0.5)
Eosinophils Relative: 4 %
HCT: 33.6 % — ABNORMAL LOW (ref 36.0–46.0)
Hemoglobin: 10.6 g/dL — ABNORMAL LOW (ref 12.0–15.0)
Immature Granulocytes: 0 %
Lymphocytes Relative: 13 %
Lymphs Abs: 1.4 10*3/uL (ref 0.7–4.0)
MCH: 31 pg (ref 26.0–34.0)
MCHC: 31.5 g/dL (ref 30.0–36.0)
MCV: 98.2 fL (ref 80.0–100.0)
Monocytes Absolute: 0.8 10*3/uL (ref 0.1–1.0)
Monocytes Relative: 7 %
Neutro Abs: 7.9 10*3/uL — ABNORMAL HIGH (ref 1.7–7.7)
Neutrophils Relative %: 76 %
Platelets: 277 10*3/uL (ref 150–400)
RBC: 3.42 MIL/uL — ABNORMAL LOW (ref 3.87–5.11)
RDW: 14.1 % (ref 11.5–15.5)
WBC: 10.5 10*3/uL (ref 4.0–10.5)
nRBC: 0 % (ref 0.0–0.2)

## 2018-07-01 LAB — BRAIN NATRIURETIC PEPTIDE: B Natriuretic Peptide: 297 pg/mL — ABNORMAL HIGH (ref 0.0–100.0)

## 2018-07-01 NOTE — Progress Notes (Signed)
[facility]  SUBJECTIVE: Tiffany Barber is a 78 year old female with a past medical history significant for hypertension and hyperlipidemia who recently underwent dual chamber pacemaker insertion with Dr. Darrold JunkerParaschos on 06/30/18 for a 2nd degree AV block.  Pacemaker was interrogated post-operatively by Dr. Darrold JunkerParaschos and [employer] Rep which revealed a normal functioning device.  Chest xray was negative for pneumothorax.   Today, Tiffany Barber reports mild soreness over incision site, but overall doing well and denies chest pain or shortness of breath.  Denies heart racing or fluttering.     Vitals:   07/01/18 0400 07/01/18 0500 07/01/18 0600 07/01/18 0700  BP: 139/61 (!) 154/58 (!) 142/57 (!) 149/63  Pulse: 77 77 77 80  Resp: (!) 22 (!) 24 19 15   Temp:      TempSrc:      SpO2: 99% 98% 97% 97%  Weight:      Height:         Intake/Output Summary (Last 24 hours) at 07/01/2018 0800 Last data filed at 07/01/2018 [id number] Gross per 24 hour  Intake 653.59 ml  Output 1450 ml  Net -796.41 ml      PHYSICAL EXAM  General: Well developed, well nourished, in no acute distress HEENT:  Normocephalic and atramatic Neck:  No JVD.  Lungs: Clear bilaterally to auscultation and percussion. Heart: HRRR . Normal S1 and S2 without gallops or murmurs. Bandage in place, no evidence of significant bleeding or drainage. Abdomen: Bowel sounds are positive, abdomen soft and non-tender  Msk:  Back normal.  Normal strength and tone for age. Extremities: No clubbing, cyanosis or edema.   Neuro: Alert and oriented X 3. Psych:  Good affect, responds appropriately    LABS: Basic Metabolic Panel: Recent Labs    06/29/18 1636 06/30/18 0230 07/01/18 0419  NA 138 138 139  K 4.1 4.0 3.8  CL 97* 98 99  CO2 30 32 32  GLUCOSE 154* 126* 98  BUN 22 18 16   CREATININE 0.40* 0.52 0.52  CALCIUM 8.9 8.6* 8.7*  MG 2.9*  --   --    Liver Function Tests: Recent Labs    06/29/18 1636  AST 98*  ALT 101*  ALKPHOS 116   BILITOT 1.0  PROT 7.3  ALBUMIN 4.0   No results for input(s): LIPASE, AMYLASE in the last 72 hours. CBC: Recent Labs    06/29/18 1636 06/30/18 0230 07/01/18 0419  WBC 8.8 8.5 10.5  NEUTROABS 6.9  --  7.9*  HGB 12.2 11.3* 10.6*  HCT 38.4 35.9* 33.6*  MCV 99.0 98.1 98.2  PLT 352 294 277   Cardiac Enzymes: Recent Labs    06/29/18 2043 06/30/18 0230 06/30/18 0822  TROPONINI 0.03* <0.03 0.03*   BNP: Invalid input(s): POCBNP D-Dimer: No results for input(s): DDIMER in the last 72 hours. Hemoglobin A1C: No results for input(s): HGBA1C in the last 72 hours. Fasting Lipid Panel: No results for input(s): CHOL, HDL, LDLCALC, TRIG, CHOLHDL, LDLDIRECT in the last 72 hours. Thyroid Function Tests: No results for input(s): TSH, T4TOTAL, T3FREE, THYROIDAB in the last 72 hours.  Invalid input(s): FREET3 Anemia Panel: No results for input(s): VITAMINB12, FOLATE, FERRITIN, TIBC, IRON, RETICCTPCT in the last 72 hours.  Dg Chest Port 1 View  Result Date: 06/30/2018 CLINICAL DATA:  Postop pacemaker insertion. EXAM: PORTABLE CHEST 1 VIEW COMPARISON:  06/29/2018 FINDINGS: The patient is status post placement of a dual chamber left-sided pacemaker. Of or is no evidence of a pneumothorax. The cardiac silhouette remains enlarged.  There are prominent interstitial lung markings bilaterally consistent with pulmonary edema. The lung volumes are low. Bibasilar atelectasis is noted. There is no acute osseous abnormality. IMPRESSION: 1. Status post placement of a dual chamber left-sided pacemaker with no evidence of a pneumothorax. 2. Cardiomegaly with findings of pulmonary edema. Electronically Signed   By: Katherine Mantle M.D.   On: 06/30/2018 14:49   Dg Chest Portable 1 View  Result Date: 06/29/2018 CLINICAL DATA:  Cough.  Low oxygen saturation. EXAM: PORTABLE CHEST 1 VIEW COMPARISON:  09/25/2017 FINDINGS: Heart is enlarged. The aorta is tortuous. There is newly seen widespread bilateral  pulmonary density. The pattern is nonspecific and could represent pneumonia or heart failure. Possible small amount of pleural effusion on each side. No significant bone finding. IMPRESSION: Cardiomegaly. Widespread abnormal lung density. The differential diagnosis is heart failure versus pneumonia. Electronically Signed   By: Paulina Fusi M.D.   On: 06/29/2018 17:03   Dg C-arm 1-60 Min-no Report  Result Date: 06/30/2018 Fluoroscopy was utilized by the requesting physician.  No radiographic interpretation.     Echo: Pending   TELEMETRY: AV paced rhythm   ASSESSMENT AND PLAN:  Active Problems:   CHF (congestive heart failure) (HCC)    1.  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 Harold Hedge, and all decision making was made in collaboration.   Andi Hence  PA-C 07/01/2018 8:00 AM

## 2018-07-01 NOTE — Progress Notes (Signed)
CARDIOLOGY ASSESSMENT COMPLETED TODAY CARDIOLOGY R$ECOMMENDS DISCHARGING HOME TODAY   VS reviewed  BP (!) 149/63   Pulse 80   Temp 98 F (36.7 C) (Oral)   Resp 15   Ht 5' (1.524 m)   Wt 98.6 kg   SpO2 97%   BMI 42.45 kg/m    CBC    Component Value Date/Time   WBC 10.5 07/01/2018 0419   RBC 3.42 (L) 07/01/2018 0419   HGB 10.6 (L) 07/01/2018 0419   HGB 13.4 07/06/2013 2113   HCT 33.6 (L) 07/01/2018 0419   HCT 40.9 07/06/2013 2113   PLT 277 07/01/2018 0419   PLT 347 07/06/2013 2113   MCV 98.2 07/01/2018 0419   MCV 94 07/06/2013 2113   MCH 31.0 07/01/2018 0419   MCHC 31.5 07/01/2018 0419   RDW 14.1 07/01/2018 0419   RDW 13.3 07/06/2013 2113   LYMPHSABS 1.4 07/01/2018 0419   MONOABS 0.8 07/01/2018 0419   EOSABS 0.5 07/01/2018 0419   BASOSABS 0.0 07/01/2018 0419   BMP Latest Ref Rng & Units 07/01/2018 06/30/2018 06/29/2018  Glucose 70 - 99 mg/dL 98 828(M) 034(J)  BUN 8 - 23 mg/dL 16 18 22   Creatinine 0.44 - 1.00 mg/dL 1.79 1.50 5.69(V)  Sodium 135 - 145 mmol/L 139 138 138  Potassium 3.5 - 5.1 mmol/L 3.8 4.0 4.1  Chloride 98 - 111 mmol/L 99 98 97(L)  CO2 22 - 32 mmol/L 32 32 30  Calcium 8.9 - 10.3 mg/dL 9.4(I) 0.1(K) 8.9      Lucie Leather, M.D.  [facility]  Medical Director [facility] Medical Director [facility] Cardio-Pulmonary Department

## 2018-07-01 NOTE — Progress Notes (Signed)
Patient discharged to home via [facility]. Discharge education was provided and information packet given to patient. No further questions at that time by patient.

## 2018-07-01 NOTE — Anesthesia Postprocedure Evaluation (Signed)
Anesthesia Post Note  Patient: Tiffany Barber  Procedure(s) Performed: INSERTION PACEMAKER (N/A )  Patient location during evaluation: PACU Anesthesia Type: General Level of consciousness: awake and alert Pain management: pain level controlled Vital Signs Assessment: post-procedure vital signs reviewed and stable Respiratory status: spontaneous breathing, nonlabored ventilation and respiratory function stable Cardiovascular status: blood pressure returned to baseline and stable (HR stable s/p pacemaker evaluation) Postop Assessment: no apparent nausea or vomiting Anesthetic complications: no     Last Vitals:  Vitals:   07/01/18 0700 07/01/18 0800  BP: (!) 149/63   Pulse: 80   Resp: 15   Temp:  36.7 C  SpO2: 97%     Last Pain:  Vitals:   07/01/18 0800  TempSrc: Oral  PainSc:                  Jovita Gamma

## 2018-07-01 NOTE — Discharge Summary (Signed)
Physician Discharge Summary  Patient ID: Tiffany Barber MRN: [id number] DOB/AGE: 79-18-1942 79 y.o.  Admit date: 06/29/2018 Discharge date: 07/01/2018  Admission Diagnoses:HEART BLOCK  Discharge Diagnoses: 2nd degree heart block Active Problems:   CHF (congestive heart failure) (HCC)   Discharged Condition:stable  Hospital Course:  Second-degree heart block -Her heart rate is dropping into 30s and 40s even at rest.  Patient with marked bradycardia secondary to 2-1 AV block, requires dual-chamber pacemaker.  Pacemaker placed 5/14  Consults: Cardiology    Treatments: ICU monitoring and pacemaker placement  Discharge Exam: Blood pressure (!) 149/63, pulse 80, temperature 98 F (36.7 C), temperature source Oral, resp. rate 15, height 5' (1.524 m), weight 98.6 kg, SpO2 97 %.   Disposition:   Discharge Instructions    Amb Referral to HF Clinic   Complete by:  As directed      Allergies as of 07/01/2018      Reactions   Ace Inhibitors Other (See Comments)   Other reaction(s): angioedema resulting in intubation Other reaction(s): angioedema resulting in intubation   Nifedipine Swelling, Shortness Of Breath   Lisinopril       Medication List    TAKE these medications   albuterol 108 (90 Base) MCG/ACT inhaler Commonly known as:  VENTOLIN HFA Inhale 2 puffs into the lungs every 4 (four) hours as needed for wheezing.   amLODipine 10 MG tablet Commonly known as:  NORVASC Take 10 mg by mouth daily.   amoxicillin 875 MG tablet Commonly known as:  AMOXIL Take 875 mg by mouth 2 (two) times a day.   aspirin 325 MG tablet Take 325 mg by mouth daily.   benzonatate 100 MG capsule Commonly known as:  TESSALON Take 100 mg by mouth 3 (three) times daily as needed for cough.   cloNIDine 0.1 MG tablet Commonly known as:  CATAPRES Take 0.1 mg by mouth 2 (two) times daily.   famotidine 20 MG tablet Commonly known as:  PEPCID Take 20 mg by mouth 2 (two) times a day.    fluticasone 50 MCG/ACT nasal spray Commonly known as:  FLONASE Place 2 sprays into both nostrils daily.   hydrochlorothiazide 25 MG tablet Commonly known as:  HYDRODIURIL Take 25 mg by mouth daily.   lovastatin 20 MG tablet Commonly known as:  MEVACOR Take 1 tablet by mouth daily.   meclizine 25 MG tablet Commonly known as:  ANTIVERT Take 1 tablet (25 mg total) by mouth 3 (three) times daily as needed for dizziness.   meloxicam 15 MG tablet Commonly known as:  MOBIC Take 15 mg by mouth daily.   nystatin powder Generic drug:  nystatin Apply 1 application topically 4 (four) times daily.   traMADol 50 MG tablet Commonly known as:  ULTRAM Take 1 tablet by mouth at bedtime.      Follow-up Information    [facility] Follow up on 07/07/2018.   Specialty:  Cardiology Why:  at 9:20am This will be a virtual visit Contact information: [address] [place] [address] [phone]          Signed: Erin Fulling 07/01/2018, 8:35 AM

## 2018-07-01 NOTE — Progress Notes (Signed)
[facility] [place] [facility]   PATIENT NAME: Tiffany Barber    MR#:  [id number]  DATE OF BIRTH:  Feb 25, 1940  SUBJECTIVE: Patient admitted for acute respiratory failure with hypoxia due to CHF exacerbation.  This morning noted to have bradycardia, says that shortness of breath improved and denies any chest pain.  CHIEF COMPLAINT:   Chief Complaint  Patient presents with  . Shortness of Breath    REVIEW OF SYSTEMS:   ROS CONSTITUTIONAL: No fever, fatigue or weakness.  EYES: No blurred or double vision.  EARS, NOSE, AND THROAT: No tinnitus or ear pain.  RESPIRATORY: No cough, shortness of breath, wheezing or hemoptysis.  CARDIOVASCULAR: No chest pain, orthopnea, edema.  GASTROINTESTINAL: No nausea, vomiting, diarrhea or abdominal pain.  GENITOURINARY: No dysuria, hematuria.  ENDOCRINE: No polyuria, nocturia,  HEMATOLOGY: No anemia, easy bruising or bleeding SKIN: No rash or lesion. MUSCULOSKELETAL: No joint pain or arthritis.   NEUROLOGIC: No tingling, numbness, weakness.  PSYCHIATRY: No anxiety or depression.   DRUG ALLERGIES:   Allergies  Allergen Reactions  . Ace Inhibitors Other (See Comments)    Other reaction(s): angioedema resulting in intubation Other reaction(s): angioedema resulting in intubation   . Nifedipine Swelling and Shortness Of Breath  . Lisinopril     VITALS:  Blood pressure (!) 126/50, pulse 92, temperature 98 F (36.7 C), temperature source Oral, resp. rate 20, height 5' (1.524 m), weight 98.6 kg, SpO2 96 %.  PHYSICAL EXAMINATION:  GENERAL:  78 y.o.-year-old patient lying in the bed with no acute distress.  EYES: Pupils equal, round, reactive to light and accommodation. No scleral icterus. Extraocular muscles intact.  HEENT: Head atraumatic, normocephalic. Oropharynx and nasopharynx clear.  NECK:  Supple, no jugular venous distention. No thyroid enlargement, no tenderness.  LUNGS: Normal breath sounds bilaterally,  no wheezing, rales,rhonchi or crepitation. No use of accessory muscles of respiration.  CARDIOVASCULAR: S1, S2 normal. No murmurs, rubs, or gallops.  ABDOMEN: Soft, nontender, nondistended. Bowel sounds present. No organomegaly or mass.  EXTREMITIES: No pedal edema, cyanosis, or clubbing.  NEUROLOGIC: Cranial nerves II through XII are intact. Muscle strength 5/5 in all extremities. Sensation intact. Gait not checked.  PSYCHIATRIC: The patient is alert and oriented x 3.  SKIN: No obvious rash, lesion, or ulcer.    LABORATORY PANEL:   CBC Recent Labs  Lab 07/01/18 0419  WBC 10.5  HGB 10.6*  HCT 33.6*  PLT 277   ------------------------------------------------------------------------------------------------------------------  Chemistries  Recent Labs  Lab 06/29/18 1636  07/01/18 0419  NA 138   < > 139  K 4.1   < > 3.8  CL 97*   < > 99  CO2 30   < > 32  GLUCOSE 154*   < > 98  BUN 22   < > 16  CREATININE 0.40*   < > 0.52  CALCIUM 8.9   < > 8.7*  MG 2.9*  --   --   AST 98*  --   --   ALT 101*  --   --   ALKPHOS 116  --   --   BILITOT 1.0  --   --    < > = values in this interval not displayed.   ------------------------------------------------------------------------------------------------------------------  Cardiac Enzymes Recent Labs  Lab 06/30/18 0822  TROPONINI 0.03*   ------------------------------------------------------------------------------------------------------------------  RADIOLOGY:  Dg Chest Port 1 View  Result Date: 06/30/2018 CLINICAL DATA:  Postop pacemaker insertion. EXAM: PORTABLE CHEST 1 VIEW COMPARISON:  06/29/2018 FINDINGS:  The patient is status post placement of a dual chamber left-sided pacemaker. Of or is no evidence of a pneumothorax. The cardiac silhouette remains enlarged. There are prominent interstitial lung markings bilaterally consistent with pulmonary edema. The lung volumes are low. Bibasilar atelectasis is noted. There is no acute  osseous abnormality. IMPRESSION: 1. Status post placement of a dual chamber left-sided pacemaker with no evidence of a pneumothorax. 2. Cardiomegaly with findings of pulmonary edema. Electronically Signed   By: Katherine Mantlehristopher  Green M.D.   On: 06/30/2018 14:49   Dg Chest Portable 1 View  Result Date: 06/29/2018 CLINICAL DATA:  Cough.  Low oxygen saturation. EXAM: PORTABLE CHEST 1 VIEW COMPARISON:  09/25/2017 FINDINGS: Heart is enlarged. The aorta is tortuous. There is newly seen widespread bilateral pulmonary density. The pattern is nonspecific and could represent pneumonia or heart failure. Possible small amount of pleural effusion on each side. No significant bone finding. IMPRESSION: Cardiomegaly. Widespread abnormal lung density. The differential diagnosis is heart failure versus pneumonia. Electronically Signed   By: Paulina FusiMark  Shogry M.D.   On: 06/29/2018 17:03   Dg C-arm 1-60 Min-no Report  Result Date: 06/30/2018 Fluoroscopy was utilized by the requesting physician.  No radiographic interpretation.    EKG:   Orders placed or performed during the hospital encounter of 06/29/18  . EKG 12-Lead  . EKG 12-Lead  . EKG 12-Lead in am (before 8am)  . EKG 12-Lead in am (before 8am)    ASSESSMENT AND PLAN:  78 year old female history of essential hypertension admitted for CHF exacerbation acute respiratory failure with hypoxia secondary to CHF exacerbation,; systolic or diastolic, follow echocardiogram, received IV Lasix, on HCTZ. 2. sinus bradycardia, continue the clonidine, patient scheduled for possible pacemaker placement.  Follow echocardiogram.  All the records are reviewed and case discussed with Care Management/Social Workerr. Management plans discussed with the patient, family and they are in agreement.  CODE STATUS: Full code  TOTAL TIME TAKING CARE OF THIS PATIENT: 38 minutes.   POSSIBLE D/C IN 1-2 DAYS, DEPENDING ON CLINICAL CONDITION.  50% time spent in counseling, coordination of  care Katha HammingSnehalatha Khamari Yousuf M.D on 07/01/2018 at 10:32 AM  Between 7am to 6pm - Pager - [phone]  After 6pm go to [email or web address] - password [email or web address] [facility]  [facility]  Office  [phone]  CC: Primary care physician; Leanna SatoMiles, Linda M, MD   Note: This dictation was prepared with Dragon dictation along with smaller phrase technology. Any transcriptional errors that result from this process are unintentional.

## 2018-07-02 LAB — ECHOCARDIOGRAM COMPLETE
Height: 60 in
Weight: 3477.98 oz

## 2018-07-04 ENCOUNTER — Telehealth: Payer: Self-pay

## 2018-07-04 ENCOUNTER — Ambulatory Visit: Payer: Medicare Other | Admitting: Family Medicine

## 2018-07-04 NOTE — Telephone Encounter (Signed)
TELEPHONE CALL NOTE  Nafisah Whoolery has been deemed a candidate for a follow-up tele-health visit to limit community exposure during the Covid-19 pandemic. I spoke with the patient via phone to ensure availability of phone/video source, confirm preferred email & phone number, discuss instructions and expectations, and review consent.   I reminded Rainey Pines to be prepared with any vital sign and/or heart rhythm information that could potentially be obtained via home monitoring, at the time of her visit.  Finally, I reminded Rainey Pines to expect an e-mail containing a link for their video-based visit approximately 15 minutes before her visit, or alternatively, a phone call at the time of her visit if her visit is planned to be a phone encounter.  Did the patient verbally consent to treatment as below? Yes  Fronia Depass L, CMA 07/04/2018 1:20 PM  CONSENT FOR TELE-HEALTH VISIT - PLEASE REVIEW  I hereby voluntarily request, consent and authorize [facility] and its employed or contracted physicians, physician assistants, nurse practitioners or other licensed health care professionals (the Practitioner), to provide me with telemedicine health care services (the "Services") as deemed necessary by the treating Practitioner. I acknowledge and consent to receive the Services by the Practitioner via telemedicine. I understand that the telemedicine visit will involve communicating with the Practitioner through telephonic communication technology and the disclosure of certain medical information by electronic transmission. I acknowledge that I have been given the opportunity to request an in-person assessment or other available alternative prior to the telemedicine visit and am voluntarily participating in the telemedicine visit.  I understand that I have the right to withhold or withdraw my consent to the use of telemedicine in the course of my care at any time, without affecting my  right to future care or treatment, and that the Practitioner or I may terminate the telemedicine visit at any time. I understand that I have the right to inspect all information obtained and/or recorded in the course of the telemedicine visit and may receive copies of available information for a reasonable fee.  I understand that some of the potential risks of receiving the Services via telemedicine include:  Marland Kitchen Delay or interruption in medical evaluation due to technological equipment failure or disruption; . Information transmitted may not be sufficient (e.g. poor resolution of images) to allow for appropriate medical decision making by the Practitioner; and/or  . In rare instances, security protocols could fail, causing a breach of personal health information.  Furthermore, I acknowledge that it is my responsibility to provide information about my medical history, conditions and care that is complete and accurate to the best of my ability. I acknowledge that Practitioner's advice, recommendations, and/or decision may be based on factors not within their control, such as incomplete or inaccurate data provided by me or lack of visual representation. I understand that the practice of medicine is not an exact science and that Practitioner makes no warranties or guarantees regarding treatment outcomes. I acknowledge that I will receive a copy of this consent concurrently upon execution via email to the email address I last provided but may also request a printed copy by calling the office of [facility].    I understand that my insurance may be billed for this visit.   I have read or had this consent read to me. . I understand the contents of this consent, which adequately explains the benefits and risks of the Services being provided via telemedicine.  . I have been  provided ample opportunity to ask questions regarding this consent and the Services and have had my questions answered to my  satisfaction. . I give my informed consent for the services to be provided through the use of telemedicine in my medical care  By participating in this telemedicine visit I agree to the above.

## 2018-07-04 NOTE — Telephone Encounter (Signed)
   TELEPHONE CALL NOTE  This patient has been deemed a candidate for follow-up tele-health visit to limit community exposure during the Covid-19 pandemic. I spoke with the patient via phone to discuss instructions. The patient was advised to review the section on consent for treatment as well. The patient will receive a phone call 2-3 days prior to their E-Visit at which time consent will be verbally confirmed. A Virtual Office Visit appointment type has been scheduled for 07/07/2018 with [facility].  Nada Godley L, CMA 07/04/2018 1:20 PM

## 2018-07-06 ENCOUNTER — Telehealth: Payer: Self-pay

## 2018-07-06 NOTE — Telephone Encounter (Signed)
Spoke with patient and advised the call she received it looks like it was an appointment reminder for 07/08/2018, no other notes in the chart. Patient understood

## 2018-07-06 NOTE — Telephone Encounter (Signed)
[facility] Night - Client Nonclinical Telephone Record [facility] Client [facility] Night - Client Client Site [facility] [place] - Night Physician [facility] - PHYSICIAN, Crissie Figures- MD Contact Type Call Who Is Calling Patient / Member / Family / Caregiver Caller Name Paw Carbine Caller Phone Number [phone] or [phone] Call Type Message Only Information Provided Reason for Call Returning a Call from the Office Initial Comment Caller is returning a call from the office. Additional Comment

## 2018-07-07 ENCOUNTER — Ambulatory Visit: Payer: Medicare Other | Attending: Family | Admitting: Family

## 2018-07-07 ENCOUNTER — Other Ambulatory Visit: Payer: Self-pay

## 2018-07-07 DIAGNOSIS — I5032 Chronic diastolic (congestive) heart failure: Secondary | ICD-10-CM

## 2018-07-07 DIAGNOSIS — I10 Essential (primary) hypertension: Secondary | ICD-10-CM

## 2018-07-07 NOTE — Patient Instructions (Signed)
Continue weighing daily and call for an overnight weight gain of > 2 pounds or a weekly weight gain of >5 pounds. 

## 2018-07-07 NOTE — Progress Notes (Signed)
Virtual Visit via Telephone Note    Evaluation Performed:  Initial visit  This visit type was conducted due to national recommendations for restrictions regarding the COVID-19 Pandemic (e.g. social distancing).  This format is felt to be most appropriate for this patient at this time.  All issues noted in this document were discussed and addressed.  No physical exam was performed (except for noted visual exam findings with Video Visits).  Please refer to the patient's chart (MyChart message for video visits and phone note for telephone visits) for the patient's consent to telehealth for [facility]  Date:  07/07/2018   ID:  Rainey Pines, DOB [birth year], MRN [id number]  Patient Location:  [address] [place] [address]   Provider location:   [facility] [address] [place], [place] [address]  PCP:  Leanna Sato, MD  Cardiologist:  Marcina Millard, MD Electrophysiologist:  None   Chief Complaint:  Shortness of breath  History of Present Illness:    Nataiya Czarnowski is a 78 y.o. female who presents via audio/video conferencing for a telehealth visit today.  Patient verified DOB and address.  The patient does not have symptoms concerning for COVID-19 infection (fever, chills, cough, or new SHORTNESS OF BREATH).   Patient reports moderate shortness of breath upon minimal exertion. She describes this as chronic in nature having been present for several years. She has associated swelling in her legs and fatigue along with this. She denies any dizziness, chest pain, cough, rhinorrhea, sore throat or weight gain. Patient can't specifically say what her weight or BP is as she says that her daughter-in-law keeps up with all of that. Patient also wasn't sure about her medications.   Prior CV studies:   The following studies were reviewed today:  Echo report from 06/30/2018 reviewed and showed an EF of 60-65%.   Past Medical History:  Diagnosis  Date  . 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 Hypertension    Past Surgical History:  Procedure Laterality Date  . PACEMAKER INSERTION N/A 06/30/2018   Procedure: INSERTION PACEMAKER;  Surgeon: Marcina Millard, MD;  Location: [facility] ORS;  Service: Cardiovascular;  Laterality: N/A;     Current Meds  Medication Sig  . albuterol (VENTOLIN HFA) 108 (90 Base) MCG/ACT inhaler Inhale 2 puffs into the lungs every 4 (four) hours as needed for wheezing.  Marland Kitchen amLODipine (NORVASC) 10 MG tablet Take 10 mg by mouth daily.  Marland Kitchen amoxicillin (AMOXIL) 875 MG tablet Take 875 mg by mouth 2 (two) times a day.  Marland Kitchen aspirin 325 MG tablet Take 325 mg by mouth daily.  . famotidine (PEPCID) 20 MG tablet Take 20 mg by mouth 2 (two) times a day.  . fluticasone (FLONASE) 50 MCG/ACT nasal spray Place 2 sprays into both nostrils daily.  . hydrochlorothiazide (HYDRODIURIL) 25 MG tablet Take 25 mg by mouth daily.  Marland Kitchen lovastatin (MEVACOR) 20 MG tablet Take 1 tablet by mouth daily.  . meloxicam (MOBIC) 15 MG tablet Take 15 mg by mouth daily.  . NYSTATIN powder Apply 1 application topically 4 (four) times daily.  . traMADol (ULTRAM) 50 MG tablet Take 1 tablet by mouth at bedtime.      Allergies:   Ace inhibitors; Nifedipine; and Lisinopril   Social History   Tobacco Use  . Smoking status: Never Smoker  . Smokeless tobacco: Never Used  Substance Use Topics  . Alcohol use: No  . Drug  use: No     Family Hx: The patient's family history includes Diabetes in her daughter; Hyperlipidemia in her daughter; Hypertension in her mother.  ROS:   Please see the history of present illness.     All other systems reviewed and are negative.   Labs/Other Tests and Data Reviewed:    Recent Labs: 06/29/2018: ALT 101; Magnesium 2.9 07/01/2018: B Natriuretic Peptide 297.0; BUN 16; Creatinine, Ser 0.52; Hemoglobin 10.6; Platelets 277; Potassium 3.8; Sodium 139   Recent Lipid Panel Lab Results  Component Value  Date/Time   CHOL 193 11/15/2014   TRIG 99 11/15/2014   HDL 59 11/15/2014   LDLCALC 114 11/15/2014    Wt Readings from Last 3 Encounters:  06/30/18 217 lb 6 oz (98.6 kg)  09/28/17 195 lb (88.5 kg)  09/25/17 195 lb (88.5 kg)     Exam:    Vital Signs:  There were no vitals taken for this visit.   Well nourished, well developed female in no  acute distress.   ASSESSMENT & PLAN:    1. Chronic heart failure with preserved ejection fraction-  - NYHA class III - euvolemic per patient's description of symptoms although phone visit was difficult due to patient's accent - weighing daily but unable to state her weight as family keeps up with that. Did discuss the importance of calling for an overnight weight gain of >2 pounds or a weekly weight gain of >5 pounds - not adding salt to her food and she says that her son reads the food labels for sodium content - saw cardiology Mellissa Kohut([place]) 05/10/2018 and returns later today - BNP 07/01/2018 was 297.0  2: HTN-   - says that her BP is being checked at home but she can't recall what it is - getting established with PCP Leone Payor([place]) 07/08/2018 - BMP from 07/01/2018 reviewed and showed sodium 139, potassium 3.8, creatinine 0.52 and GFR >60  COVID-19 Education: The signs and symptoms of COVID-19 were discussed with the patient and how to seek care for testing (follow up with PCP or arrange E-visit).  The importance of social distancing was discussed today.  Patient Risk:   After full review of this patients clinical status, I feel that they are at least moderate risk at this time.  Time:   Today, I have spent 9 minutes with the patient with telehealth technology discussing diet, weight and symptoms to report.     Medication Adjustments/Labs and Tests Ordered: Current medicines are reviewed at length with the patient today.  Concerns regarding medicines are outlined above.   Tests Ordered: No orders of the defined types were placed in this  encounter.  Medication Changes: No orders of the defined types were placed in this encounter.   Disposition:  Follow-up in 6 weeks or sooner for any questions/problems before then.   Signed, Delma Freezeina A Lorris Carducci, FNP  07/07/2018 9:29 AM    [facility]

## 2018-07-08 ENCOUNTER — Ambulatory Visit (INDEPENDENT_AMBULATORY_CARE_PROVIDER_SITE_OTHER): Payer: Medicare Other | Admitting: Family Medicine

## 2018-07-08 ENCOUNTER — Encounter: Payer: Self-pay | Admitting: Family Medicine

## 2018-07-08 VITALS — BP 134/88 | HR 90 | Ht 60.0 in | Wt 185.0 lb

## 2018-07-08 DIAGNOSIS — R2689 Other abnormalities of gait and mobility: Secondary | ICD-10-CM

## 2018-07-08 DIAGNOSIS — Z9181 History of falling: Secondary | ICD-10-CM

## 2018-07-08 DIAGNOSIS — R0609 Other forms of dyspnea: Secondary | ICD-10-CM

## 2018-07-08 DIAGNOSIS — R5381 Other malaise: Secondary | ICD-10-CM | POA: Diagnosis not present

## 2018-07-08 MED ORDER — ALBUTEROL SULFATE HFA 108 (90 BASE) MCG/ACT IN AERS: 2.0000 | INHALATION_SPRAY | RESPIRATORY_TRACT | 2 refills | Status: DC | PRN

## 2018-07-08 MED ORDER — SPACER/AERO-HOLDING CHAMBERS DEVI: 1.0000 [IU] | 0 refills | Status: DC | PRN

## 2018-07-08 NOTE — Patient Instructions (Signed)
Hi Mrs. Gullickson,   It was a pleasure to see you for your virtual visit today. I look forward to seeing you in the office in 6 weeks- please call to schedule an appointment.   I have sent in a refill of your albuterol inhaler as well as a spacer to make it easier to get the medication into your lungs.   I have placed an order for home health to come out and evaluate you for services to improve your endurance, breathing and mobility. In the meantime, try to do deep breathing exercises several times a day. Elevate your feet when sitting. Try to avoid sitting for more than an hour at a time at least getting up and standing.   If you need anything prior to your next appointment, please do not hesitate to call.   Warm regards,  Deboraha Sprang, FNP-BC

## 2018-07-08 NOTE — Progress Notes (Signed)
Virtual Visit via Video Note  I connected with Tiffany Barber on 07/08/18 at 11:30 AM EDT by a video enabled telemedicine application and verified that I am speaking with the correct person using two identifiers.  Location: Patient: In her home.  Provider: In my home Persons on call: patient, her son, Mellody DanceKeith, and her daughter in Social workerlaw, Velna HatchetSheila.    I discussed the limitations of evaluation and management by telemedicine and the availability of in person appointments. The patient expressed understanding and agreed to proceed.  History of Present Illness: This is a 78 yo female who requests virtual visit today to establish care.   The patient most [facility].   She was admitted 07/01/2018 to 07/01/2018 with second-degree heart block.  She had a pacemaker inserted.  She followed up with cardiology yesterday via virtual visit.  She reports since coming home from the hospital she feels short of breath with exertion.  She has minimal cough and sputum production.  She denies fever.  She has had 2 COVID-19 test which were negative.  Her son and daughter-in-law report that she primarily sits in her wheelchair and needs quite a bit of assistance transferring.  She does report some pain at the surgical site for her placement of her pacemaker.  She takes Tylenol which helps with this.  She was receiving outpatient physical therapy in March but this was stopped due to concerns around coronavirus pandemic.  She is interested in resuming therapy to improve her strength, balance and ability to walk longer distances.  She uses albuterol inhaler 1-2 times a day as needed with improvement of breathing.  Review of systems- pain at pacemaker incision site otherwise, no chest pain. No fever/chills. Positive for dyspnea on exertion, little cough occasionally productive of clear sputum, normal appetite, no nausea/vomiting/dysuria/hematuria.  Occasional constipation and takes stool  softener and drinks prune juice.  No recent falls.    Past Medical History:  Diagnosis Date  . 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 Hypertension    Past Surgical History:  Procedure Laterality Date  . PACEMAKER INSERTION N/A 06/30/2018   Procedure: INSERTION PACEMAKER;  Surgeon: Marcina MillardParaschos, Alexander, MD;  Location: [facility] ORS;  Service: Cardiovascular;  Laterality: N/A;  . REPLACEMENT TOTAL KNEE BILATERAL     Family History  Problem Relation Age of Onset  . Hypertension Mother   . Stroke Mother   . Diabetes Daughter   . Hyperlipidemia Daughter    Social History   Tobacco Use  . Smoking status: Never Smoker  . Smokeless tobacco: Never Used  Substance Use Topics  . Alcohol use: No  . Drug use: No      Observations/Objective: The patient is alert and answers questions appropriately.  Visible skin unremarkable.  She is obese.  She is normally conversive and does not sound short of breath.  Her mood and affect are appropriate.  BP 134/88   Pulse 90   Ht 5' (1.524 m)   Wt 185 lb (83.9 kg)   BMI 36.13 kg/m    Assessment and Plan: 1. DOE (dyspnea on exertion) -Dyspnea does not sound infectious in origin.  Likely related to recent hospitalization and deconditioning.  We will have her evaluated for home health therapy. - Ambulatory referral to Home Health - albuterol (VENTOLIN HFA) 108 (90 Base) MCG/ACT inhaler; Inhale 2 puffs into the lungs every 4 (four) hours as needed for wheezing.  Dispense: 1 Inhaler;  Refill: 2 - Spacer/Aero-Holding Chambers DEVI; 1 Units by Does not apply route as needed.  Dispense: 1 each; Refill: 0  2. Physical deconditioning - Ambulatory referral to Home Health  3. Poor balance - Ambulatory referral to Home Health  4.  At risk for falls - Ambulatory referral to Home Health  - follow up in 6 weeks  Olean Ree, FNP-BC  [facility], [facility]  07/08/2018 1:41 PM   Follow Up  Instructions: AVS printed and mailed to patient's home address.    I discussed the assessment and treatment plan with the patient. The patient was provided an opportunity to ask questions and all were answered. The patient agreed with the plan and demonstrated an understanding of the instructions.   The patient was advised to call back or seek an in-person evaluation if the symptoms worsen or if the condition fails to improve as anticipated.  Emi Belfast, FNP

## 2018-07-11 ENCOUNTER — Inpatient Hospital Stay
Admission: EM | Admit: 2018-07-11 | Discharge: 2018-07-20 | DRG: 291 | Disposition: A | Discharge status: Other Acute Inpatient Hospital | Payer: Medicare Other | Attending: Internal Medicine | Admitting: Internal Medicine

## 2018-07-11 ENCOUNTER — Emergency Department: Payer: Medicare Other

## 2018-07-11 ENCOUNTER — Other Ambulatory Visit: Payer: Self-pay

## 2018-07-11 ENCOUNTER — Inpatient Hospital Stay: Payer: Medicare Other

## 2018-07-11 DIAGNOSIS — I441 Atrioventricular block, second degree: Secondary | ICD-10-CM | POA: Diagnosis present

## 2018-07-11 DIAGNOSIS — Z978 Presence of other specified devices: Secondary | ICD-10-CM | POA: Diagnosis not present

## 2018-07-11 DIAGNOSIS — Z20828 Contact with and (suspected) exposure to other viral communicable diseases: Secondary | ICD-10-CM | POA: Diagnosis present

## 2018-07-11 DIAGNOSIS — M1990 Unspecified osteoarthritis, unspecified site: Secondary | ICD-10-CM | POA: Diagnosis present

## 2018-07-11 DIAGNOSIS — Z950 Presence of cardiac pacemaker: Secondary | ICD-10-CM | POA: Diagnosis not present

## 2018-07-11 DIAGNOSIS — Z96653 Presence of artificial knee joint, bilateral: Secondary | ICD-10-CM | POA: Diagnosis present

## 2018-07-11 DIAGNOSIS — G40909 Epilepsy, unspecified, not intractable, without status epilepticus: Secondary | ICD-10-CM | POA: Diagnosis not present

## 2018-07-11 DIAGNOSIS — J9600 Acute respiratory failure, unspecified whether with hypoxia or hypercapnia: Secondary | ICD-10-CM

## 2018-07-11 DIAGNOSIS — I5033 Acute on chronic diastolic (congestive) heart failure: Secondary | ICD-10-CM | POA: Diagnosis present

## 2018-07-11 DIAGNOSIS — Z8673 Personal history of transient ischemic attack (TIA), and cerebral infarction without residual deficits: Secondary | ICD-10-CM | POA: Diagnosis not present

## 2018-07-11 DIAGNOSIS — Z823 Family history of stroke: Secondary | ICD-10-CM

## 2018-07-11 DIAGNOSIS — I5031 Acute diastolic (congestive) heart failure: Secondary | ICD-10-CM | POA: Diagnosis not present

## 2018-07-11 DIAGNOSIS — I509 Heart failure, unspecified: Secondary | ICD-10-CM

## 2018-07-11 DIAGNOSIS — Z7189 Other specified counseling: Secondary | ICD-10-CM | POA: Diagnosis not present

## 2018-07-11 DIAGNOSIS — E876 Hypokalemia: Secondary | ICD-10-CM | POA: Diagnosis not present

## 2018-07-11 DIAGNOSIS — I110 Hypertensive heart disease with heart failure: Principal | ICD-10-CM | POA: Diagnosis present

## 2018-07-11 DIAGNOSIS — J9622 Acute and chronic respiratory failure with hypercapnia: Secondary | ICD-10-CM | POA: Diagnosis not present

## 2018-07-11 DIAGNOSIS — Z6841 Body Mass Index (BMI) 40.0 and over, adult: Secondary | ICD-10-CM

## 2018-07-11 DIAGNOSIS — K5900 Constipation, unspecified: Secondary | ICD-10-CM | POA: Diagnosis present

## 2018-07-11 DIAGNOSIS — Z833 Family history of diabetes mellitus: Secondary | ICD-10-CM

## 2018-07-11 DIAGNOSIS — J9621 Acute and chronic respiratory failure with hypoxia: Secondary | ICD-10-CM | POA: Diagnosis present

## 2018-07-11 DIAGNOSIS — D638 Anemia in other chronic diseases classified elsewhere: Secondary | ICD-10-CM | POA: Diagnosis present

## 2018-07-11 DIAGNOSIS — E785 Hyperlipidemia, unspecified: Secondary | ICD-10-CM | POA: Diagnosis present

## 2018-07-11 DIAGNOSIS — Z8249 Family history of ischemic heart disease and other diseases of the circulatory system: Secondary | ICD-10-CM

## 2018-07-11 DIAGNOSIS — R4020 Unspecified coma: Secondary | ICD-10-CM | POA: Diagnosis not present

## 2018-07-11 DIAGNOSIS — R569 Unspecified convulsions: Secondary | ICD-10-CM | POA: Diagnosis not present

## 2018-07-11 DIAGNOSIS — J9601 Acute respiratory failure with hypoxia: Secondary | ICD-10-CM | POA: Diagnosis present

## 2018-07-11 DIAGNOSIS — G9340 Encephalopathy, unspecified: Secondary | ICD-10-CM | POA: Diagnosis not present

## 2018-07-11 DIAGNOSIS — R0989 Other specified symptoms and signs involving the circulatory and respiratory systems: Secondary | ICD-10-CM | POA: Diagnosis not present

## 2018-07-11 DIAGNOSIS — Z888 Allergy status to other drugs, medicaments and biological substances status: Secondary | ICD-10-CM

## 2018-07-11 DIAGNOSIS — I469 Cardiac arrest, cause unspecified: Secondary | ICD-10-CM | POA: Diagnosis not present

## 2018-07-11 DIAGNOSIS — E871 Hypo-osmolality and hyponatremia: Secondary | ICD-10-CM | POA: Diagnosis present

## 2018-07-11 DIAGNOSIS — G939 Disorder of brain, unspecified: Secondary | ICD-10-CM | POA: Diagnosis not present

## 2018-07-11 DIAGNOSIS — R609 Edema, unspecified: Secondary | ICD-10-CM

## 2018-07-11 DIAGNOSIS — Z515 Encounter for palliative care: Secondary | ICD-10-CM | POA: Diagnosis not present

## 2018-07-11 DIAGNOSIS — Z4659 Encounter for fitting and adjustment of other gastrointestinal appliance and device: Secondary | ICD-10-CM

## 2018-07-11 DIAGNOSIS — E669 Obesity, unspecified: Secondary | ICD-10-CM | POA: Diagnosis present

## 2018-07-11 DIAGNOSIS — G931 Anoxic brain damage, not elsewhere classified: Secondary | ICD-10-CM | POA: Diagnosis not present

## 2018-07-11 DIAGNOSIS — R0602 Shortness of breath: Secondary | ICD-10-CM

## 2018-07-11 LAB — CBC WITH DIFFERENTIAL/PLATELET
Abs Immature Granulocytes: 0.06 10*3/uL (ref 0.00–0.07)
Basophils Absolute: 0 10*3/uL (ref 0.0–0.1)
Basophils Relative: 0 %
Eosinophils Absolute: 0.2 10*3/uL (ref 0.0–0.5)
Eosinophils Relative: 2 %
HCT: 30.4 % — ABNORMAL LOW (ref 36.0–46.0)
Hemoglobin: 9.5 g/dL — ABNORMAL LOW (ref 12.0–15.0)
Immature Granulocytes: 1 %
Lymphocytes Relative: 5 %
Lymphs Abs: 0.6 10*3/uL — ABNORMAL LOW (ref 0.7–4.0)
MCH: 31.3 pg (ref 26.0–34.0)
MCHC: 31.3 g/dL (ref 30.0–36.0)
MCV: 100 fL (ref 80.0–100.0)
Monocytes Absolute: 0.7 10*3/uL (ref 0.1–1.0)
Monocytes Relative: 6 %
Neutro Abs: 8.8 10*3/uL — ABNORMAL HIGH (ref 1.7–7.7)
Neutrophils Relative %: 86 %
Platelets: 433 10*3/uL — ABNORMAL HIGH (ref 150–400)
RBC: 3.04 MIL/uL — ABNORMAL LOW (ref 3.87–5.11)
RDW: 14.3 % (ref 11.5–15.5)
WBC: 10.3 10*3/uL (ref 4.0–10.5)
nRBC: 0 % (ref 0.0–0.2)

## 2018-07-11 LAB — TROPONIN I: Troponin I: <0.03 ng/mL (ref <0.03)

## 2018-07-11 LAB — COMPREHENSIVE METABOLIC PANEL
ALT: 63 U/L — ABNORMAL HIGH (ref 0–44)
AST: 50 U/L — ABNORMAL HIGH (ref 15–41)
Albumin: 3.8 g/dL (ref 3.5–5.0)
Alkaline Phosphatase: 106 U/L (ref 38–126)
Anion gap: 10 (ref 5–15)
BUN: 41 mg/dL — ABNORMAL HIGH (ref 8–23)
CO2: 27 mmol/L (ref 22–32)
Calcium: 9.3 mg/dL (ref 8.9–10.3)
Chloride: 97 mmol/L — ABNORMAL LOW (ref 98–111)
Creatinine, Ser: 0.69 mg/dL (ref 0.44–1.00)
GFR calc Af Amer: >60 mL/min (ref >60)
GFR calc non Af Amer: >60 mL/min (ref >60)
Glucose, Bld: 205 mg/dL — ABNORMAL HIGH (ref 70–99)
Potassium: 4.8 mmol/L (ref 3.5–5.1)
Sodium: 134 mmol/L — ABNORMAL LOW (ref 135–145)
Total Bilirubin: 0.9 mg/dL (ref 0.3–1.2)
Total Protein: 7.3 g/dL (ref 6.5–8.1)

## 2018-07-11 LAB — MAGNESIUM: Magnesium: 3.2 mg/dL — ABNORMAL HIGH (ref 1.7–2.4)

## 2018-07-11 LAB — LACTIC ACID, PLASMA: Lactic Acid, Venous: 1.6 mmol/L (ref 0.5–1.9)

## 2018-07-11 LAB — BRAIN NATRIURETIC PEPTIDE: B Natriuretic Peptide: 145 pg/mL — ABNORMAL HIGH (ref 0.0–100.0)

## 2018-07-11 LAB — SARS CORONAVIRUS 2 BY RT PCR (HOSPITAL ORDER, PERFORMED IN ~~LOC~~ HOSPITAL LAB): SARS Coronavirus 2: NEGATIVE

## 2018-07-11 MED ORDER — SENNOSIDES-DOCUSATE SODIUM 8.6-50 MG PO TABS
1.0000 | ORAL_TABLET | Freq: Every evening | ORAL | Status: DC | PRN
  Administered 2018-07-13: 1 via ORAL
  Filled 2018-07-11: qty 1

## 2018-07-11 MED ORDER — ACETAMINOPHEN 650 MG RE SUPP: 650.0000 mg | Freq: Four times a day (QID) | RECTAL | Status: DC | PRN

## 2018-07-11 MED ORDER — CARVEDILOL 3.125 MG PO TABS
3.1250 mg | ORAL_TABLET | Freq: Two times a day (BID) | ORAL | Status: DC
  Administered 2018-07-11 – 2018-07-13 (×4): 3.125 mg via ORAL
  Filled 2018-07-11 (×3): qty 1

## 2018-07-11 MED ORDER — ONDANSETRON HCL 4 MG/2ML IJ SOLN: 4.0000 mg | Freq: Four times a day (QID) | INTRAMUSCULAR | Status: DC | PRN

## 2018-07-11 MED ORDER — HYDROCODONE-ACETAMINOPHEN 5-325 MG PO TABS
1.0000 | ORAL_TABLET | ORAL | Status: DC | PRN
  Administered 2018-07-12 – 2018-07-14 (×2): 1 via ORAL
  Filled 2018-07-11 (×2): qty 1

## 2018-07-11 MED ORDER — AMLODIPINE BESYLATE 10 MG PO TABS
10.0000 mg | ORAL_TABLET | Freq: Every day | ORAL | Status: DC
  Administered 2018-07-12: 10 mg via ORAL
  Filled 2018-07-11: qty 1

## 2018-07-11 MED ORDER — SODIUM CHLORIDE 0.9% FLUSH: 3.0000 mL | INTRAVENOUS | Status: DC | PRN

## 2018-07-11 MED ORDER — ACETAMINOPHEN 325 MG PO TABS: 650.0000 mg | ORAL_TABLET | Freq: Four times a day (QID) | ORAL | Status: DC | PRN

## 2018-07-11 MED ORDER — ALBUTEROL SULFATE (2.5 MG/3ML) 0.083% IN NEBU
2.5000 mg | INHALATION_SOLUTION | RESPIRATORY_TRACT | Status: DC | PRN
  Administered 2018-07-11 – 2018-07-14 (×5): 2.5 mg via RESPIRATORY_TRACT
  Filled 2018-07-11 (×5): qty 3

## 2018-07-11 MED ORDER — PRAVASTATIN SODIUM 20 MG PO TABS
20.0000 mg | ORAL_TABLET | Freq: Every day | ORAL | Status: DC
  Administered 2018-07-11 – 2018-07-13 (×3): 20 mg via ORAL
  Filled 2018-07-11 (×3): qty 1

## 2018-07-11 MED ORDER — ASPIRIN EC 81 MG PO TBEC
81.0000 mg | DELAYED_RELEASE_TABLET | Freq: Two times a day (BID) | ORAL | Status: DC
  Administered 2018-07-11 – 2018-07-14 (×6): 81 mg via ORAL
  Filled 2018-07-11 (×6): qty 1

## 2018-07-11 MED ORDER — ALBUTEROL SULFATE (2.5 MG/3ML) 0.083% IN NEBU
2.5000 mg | INHALATION_SOLUTION | Freq: Once | RESPIRATORY_TRACT | Status: AC
  Administered 2018-07-11: 11:00:00 2.5 mg via RESPIRATORY_TRACT
  Filled 2018-07-11: qty 3

## 2018-07-11 MED ORDER — FUROSEMIDE 10 MG/ML IJ SOLN
40.0000 mg | Freq: Once | INTRAMUSCULAR | Status: AC
  Administered 2018-07-11: 40 mg via INTRAVENOUS
  Filled 2018-07-11: qty 4

## 2018-07-11 MED ORDER — ONDANSETRON HCL 4 MG PO TABS: 4.0000 mg | ORAL_TABLET | Freq: Four times a day (QID) | ORAL | Status: DC | PRN

## 2018-07-11 MED ORDER — FUROSEMIDE 10 MG/ML IJ SOLN
20.0000 mg | Freq: Once | INTRAMUSCULAR | Status: AC
  Administered 2018-07-11: 20 mg via INTRAVENOUS
  Filled 2018-07-11: qty 2

## 2018-07-11 MED ORDER — SODIUM CHLORIDE 0.9 % IV SOLN
250.0000 mL | INTRAVENOUS | Status: DC | PRN
  Administered 2018-07-15 – 2018-07-20 (×3): 250 mL via INTRAVENOUS

## 2018-07-11 MED ORDER — GUAIFENESIN-DM 100-10 MG/5ML PO SYRP
5.0000 mL | ORAL_SOLUTION | ORAL | Status: DC | PRN
  Administered 2018-07-11 – 2018-07-14 (×3): 5 mL via ORAL
  Filled 2018-07-11 (×3): qty 5

## 2018-07-11 MED ORDER — ENOXAPARIN SODIUM 40 MG/0.4ML ~~LOC~~ SOLN
40.0000 mg | SUBCUTANEOUS | Status: DC
  Administered 2018-07-11: 40 mg via SUBCUTANEOUS
  Filled 2018-07-11: qty 0.4

## 2018-07-11 MED ORDER — BISACODYL 5 MG PO TBEC
5.0000 mg | DELAYED_RELEASE_TABLET | Freq: Every day | ORAL | Status: DC | PRN
  Administered 2018-07-12 – 2018-07-13 (×2): 5 mg via ORAL
  Filled 2018-07-11 (×2): qty 1

## 2018-07-11 MED ORDER — SODIUM CHLORIDE 0.9% FLUSH
3.0000 mL | Freq: Two times a day (BID) | INTRAVENOUS | Status: DC
  Administered 2018-07-11 – 2018-07-20 (×16): 3 mL via INTRAVENOUS

## 2018-07-11 MED ORDER — FUROSEMIDE 10 MG/ML IJ SOLN
40.0000 mg | Freq: Two times a day (BID) | INTRAMUSCULAR | Status: DC
  Administered 2018-07-11 – 2018-07-13 (×4): 40 mg via INTRAVENOUS
  Filled 2018-07-11 (×4): qty 4

## 2018-07-11 NOTE — ED Triage Notes (Signed)
Pt arrives via ems from home where she lives with son. EMS states they were called out for SOB. Pt does not norm wear O2 at home, but son place his o2 on pt due to difficulty breathing. Pt had pace maker placed last week, had stopped lasix for procedure, family started giving lasix again due to peripheral edema. Pt arrived very SOB, with labored breathing, but able to speak in short sentences. MD present at arrival for assessment.

## 2018-07-11 NOTE — H&P (Signed)
[employer] [facility] [place] [facility]   PATIENT NAME: Tiffany PinesViolet Barber    MR#:  [id number]  DATE OF BIRTH:  04/11/1940  DATE OF ADMISSION:  07/11/2018  PRIMARY CARE PHYSICIAN: Emi BelfastGessner, Deborah B, FNP   REQUESTING/REFERRING PHYSICIAN: Dionne BucySiadecki, Sebastian, MD  CHIEF COMPLAINT:   Chief Complaint  Patient presents with  . Shortness of Breath   Worsening shortness of breath 5 days. HISTORY OF PRESENT ILLNESS:  Tiffany PinesViolet Barber  is a 78 y.o. female with a known history of hypertension, hyperlipidemia, left ventricular hypertrophy, stroke and recent heart block with pacemaker placement.  The patient presents the ED with above chief complaints.  She has had worsening shortness of breath for the past 5 days.  She feels chest tightness, cough, orthopnea and nocturnal dyspnea.  She complains of weight gain and leg swelling.  She denies any fever or chills.  COVID-19 is negative.  She is put on oxygen 3 L due to hypoxia.  Chest x-ray report pulmonary edema.  She is given Lasix in the ED. PAST MEDICAL HISTORY:   Past Medical History:  Diagnosis Date  . 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 Hyperlipidemia   . Hypertension   . LVH (left ventricular hypertrophy)   . Stroke [facility](HCC)     PAST SURGICAL HISTORY:   Past Surgical History:  Procedure Laterality Date  . JOINT REPLACEMENT Left 2019   left knee  . PACEMAKER INSERTION N/A 06/30/2018   Procedure: INSERTION PACEMAKER;  Surgeon: Marcina MillardParaschos, Alexander, MD;  Location: [facility] ORS;  Service: Cardiovascular;  Laterality: N/A;  . REPLACEMENT TOTAL KNEE BILATERAL      SOCIAL HISTORY:   Social History   Tobacco Use  . Smoking status: Never Smoker  . Smokeless tobacco: Never Used  Substance Use Topics  . Alcohol use: No    FAMILY HISTORY:   Family History  Problem Relation Age of Onset  . Hypertension Mother   . Stroke Mother   . Diabetes Daughter   . Hyperlipidemia Daughter     DRUG ALLERGIES:    Allergies  Allergen Reactions  . Ace Inhibitors Other (See Comments)    Other reaction(s): angioedema resulting in intubation Other reaction(s): angioedema resulting in intubation   . Nifedipine Swelling and Shortness Of Breath  . Lisinopril     REVIEW OF SYSTEMS:   Review of Systems  Constitutional: Positive for malaise/fatigue. Negative for chills and fever.  HENT: Negative for sore throat.   Eyes: Negative for blurred vision and double vision.  Respiratory: Positive for cough and shortness of breath. Negative for hemoptysis, sputum production, wheezing and stridor.   Cardiovascular: Positive for orthopnea and leg swelling. Negative for chest pain and palpitations.  Gastrointestinal: Negative for abdominal pain, blood in stool, diarrhea, melena, nausea and vomiting.  Genitourinary: Negative for dysuria, flank pain and hematuria.  Musculoskeletal: Negative for back pain and joint pain.  Skin: Negative for rash.  Neurological: Negative for dizziness, sensory change, focal weakness, seizures, loss of consciousness, weakness and headaches.  Endo/Heme/Allergies: Negative for polydipsia.  Psychiatric/Behavioral: Negative for depression. The patient is not nervous/anxious.     MEDICATIONS AT HOME:   Prior to Admission medications   Medication Sig Start Date End Date Taking? Authorizing Provider  albuterol (VENTOLIN HFA) 108 (90 Base) MCG/ACT inhaler Inhale 2 puffs into the lungs every 4 (four) hours as needed for wheezing. 07/08/18  Yes Emi BelfastGessner, Deborah B, FNP  amLODipine (NORVASC) 10 MG tablet Take 10 mg  by mouth daily.   Yes [provider]  aspirin 81 MG EC tablet Take 81 mg by mouth 2 (two) times a day.    Yes [provider]  hydrochlorothiazide (HYDRODIURIL) 25 MG tablet Take 25 mg by mouth daily. 05/23/14  Yes [provider]  lovastatin (MEVACOR) 20 MG tablet Take 20 mg by mouth daily.    Yes [provider]  meloxicam (MOBIC) 15 MG tablet Take  15 mg by mouth daily. 05/23/14  Yes [provider]  Spacer/Aero-Holding Chambers DEVI 1 Units by Does not apply route as needed. 07/08/18  Yes Emi Belfast, FNP  traMADol (ULTRAM) 50 MG tablet Take 50 mg by mouth at bedtime.    Yes [provider]      VITAL SIGNS:  Blood pressure 130/86, pulse 89, temperature 98 F (36.7 C), temperature source Oral, resp. rate (!) 23, height 5' (1.524 m), weight 88.5 kg, SpO2 99 %.  PHYSICAL EXAMINATION:  Physical Exam  GENERAL:  78 y.o.-year-old patient lying in the bed with no acute distress.  Obesity. EYES: Pupils equal, round, reactive to light and accommodation. No scleral icterus. Extraocular muscles intact.  HEENT: Head atraumatic, normocephalic. Oropharynx and nasopharynx clear.  NECK:  Supple, no jugular venous distention. No thyroid enlargement, no tenderness.  LUNGS: Normal breath sounds bilaterally, no wheezing, bibasilar rales, no rhonchi or crepitation. No use of accessory muscles of respiration.  CARDIOVASCULAR: S1, S2 normal. No murmurs, rubs, or gallops.  ABDOMEN: Soft, nontender, nondistended. Bowel sounds present. No organomegaly or mass.  EXTREMITIES: No cyanosis, or clubbing.  Bilateral leg edema. NEUROLOGIC: Cranial nerves II through XII are intact. Muscle strength 5/5 in all extremities. Sensation intact. Gait not checked.  PSYCHIATRIC: The patient is alert and oriented x 3.  SKIN: No obvious rash, lesion, or ulcer.   LABORATORY PANEL:   CBC Recent Labs  Lab 07/11/18 1019  WBC 10.3  HGB 9.5*  HCT 30.4*  PLT 433*   ------------------------------------------------------------------------------------------------------------------  Chemistries  Recent Labs  Lab 07/11/18 1019  NA 134*  K 4.8  CL 97*  CO2 27  GLUCOSE 205*  BUN 41*  CREATININE 0.69  CALCIUM 9.3  AST 50*  ALT 63*  ALKPHOS 106  BILITOT 0.9    ------------------------------------------------------------------------------------------------------------------  Cardiac Enzymes Recent Labs  Lab 07/11/18 1019  TROPONINI <0.03   ------------------------------------------------------------------------------------------------------------------  RADIOLOGY:  Dg Chest Portable 1 View  Result Date: 07/11/2018 CLINICAL DATA:  Shortness of breath EXAM: PORTABLE CHEST 1 VIEW COMPARISON:  06/29/2018, 06/30/2018 FINDINGS: There is mild bilateral interstitial prominence. There are low lung volumes likely partially accounting for the interstitial prominence. There is no focal consolidation, pleural effusion or pneumothorax. There is stable cardiomegaly. There is a dual lead cardiac pacemaker. The osseous structures are unremarkable. IMPRESSION: 1. Cardiomegaly with mild pulmonary vascular congestion. Interstitial markings are accentuated by low lung volumes. Electronically Signed   By: Elige Ko   On: 07/11/2018 11:01      IMPRESSION AND PLAN:   Acute respiratory failure with hypoxia due to acute diastolic CHF. The patient will be admitted to telemetry floor. Start CHF protocol, Lasix 40 mg IV every 12 hours, low-dose Coreg, hold HCTZ, follow-up cardiology consult.  The patient has recent echocardiogram of with ejection fraction 60 to 65%.  Continue oxygen by nasal cannula, DuoNeb as needed. No ACE inhibitor due to allergy history.  [place] to Dr. Darrold Junker.  Recent new onset heart block, s/p pacemaker placement.  Hypertension.  Continue Norvasc, Lasix, and Coreg.  Hold HCTZ. Hyperlipidemia.  Continue statin. History of CVA.  Continue aspirin and statin.  All the records are reviewed and case discussed with ED provider. Management plans discussed with the patient, family and they are in agreement.  CODE STATUS: Full code.  TOTAL TIME TAKING CARE OF THIS PATIENT: 42 minutes.    Shaune Pollack M.D on 07/11/2018 at 1:12 PM  Between 7am to  6pm - Pager - [phone]  After 6pm go to [email or web address] - Social research officer, government  [facility] [place] [facility]  Office  [phone]  CC: Primary care physician; Emi Belfast, FNP   Note: This dictation was prepared with Dragon dictation along with smaller phrase technology. Any transcriptional errors that result from this process are unin

## 2018-07-11 NOTE — ED Notes (Signed)
Purewick in place. Patient repositioned on stretcher. Warm blankets given.

## 2018-07-11 NOTE — Progress Notes (Signed)
Advanced Care Plan.  Purpose of Encounter: CODE STATUS. Parties in Attendance: The patient and me. Patient's Decisional Capacity: Yes. Medical Story: Tiffany Barber  is a 78 y.o. female with a known history of hypertension, hyperlipidemia, left ventricular hypertrophy, stroke and recent heart block with pacemaker placement.  The patient is being admitted for acute respiratory failure with hypoxia due to acute diastolic CHF.  I discussed with patient about her current condition, prognosis and CODE STATUS.  The patient does want to be resuscitated and intubated if she has a cardiopulmonary arrest. Plan:  Code Status: Full code. Time spent discussing advance care planning: 17 minutes.

## 2018-07-11 NOTE — ED Notes (Signed)
Change in room assignment, will call when room is clean.

## 2018-07-11 NOTE — ED Notes (Signed)
Pt cleared by MD to have PO fluids, pt given ice water at this time

## 2018-07-11 NOTE — Consult Note (Signed)
[facility] Cardiology  CARDIOLOGY CONSULT NOTE  Patient ID: Tiffany Barber MRN: [id number] DOB/AGE: 79-Jun-1942 79 y.o.  Admit date: 07/11/2018 Referring Physician Imogene Burn Primary Physician [facility] Primary Cardiologist  Reason for Consultation congestive heart failure  HPI: 78 year old female referred for evaluation of congestive heart failure.  Patient recently hospitalized with second-degree AV block, status post dual-chamber pacemaker.  She returns with several day history of peripheral edema, weight gain and shortness of breath.  Urgency room, patient noted to be hypoxic.  Chest x-ray revealed pulmonary edema.  Patient was treated with furosemide IV with modest diuresis with concomitant clinical improvement.  ECG reveals atrial sensing with ventricular pacing at rate 94 bpm.  Telemetry reveals atrial sensing with ventricular pacing at a rate of 80 bpm.  Recent 2D echocardiogram 06/30/2018 revealed normal left ventricular function, with LVEF 60 to 65%.  Review of systems complete and found to be negative unless listed above     Past Medical History:  Diagnosis Date  . 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 Hyperlipidemia   . Hypertension   . LVH (left ventricular hypertrophy)   . Stroke [facility])     Past Surgical History:  Procedure Laterality Date  . JOINT REPLACEMENT Left 2019   left knee  . PACEMAKER INSERTION N/A 06/30/2018   Procedure: INSERTION PACEMAKER;  Surgeon: Marcina Millard, MD;  Location: [facility] ORS;  Service: Cardiovascular;  Laterality: N/A;  . REPLACEMENT TOTAL KNEE BILATERAL      Medications Prior to Admission  Medication Sig Dispense Refill Last Dose  . albuterol (VENTOLIN HFA) 108 (90 Base) MCG/ACT inhaler Inhale 2 puffs into the lungs every 4 (four) hours as needed for wheezing. 1 Inhaler 2 Unknown at PRN  . amLODipine (NORVASC) 10 MG tablet Take 10 mg by mouth daily.   07/11/2018 at 0800  . aspirin 81 MG EC tablet Take 81 mg by mouth 2 (two) times a day.     07/11/2018 at 0800  . hydrochlorothiazide (HYDRODIURIL) 25 MG tablet Take 25 mg by mouth daily.   07/11/2018 at 0800  . lovastatin (MEVACOR) 20 MG tablet Take 20 mg by mouth daily.   3 07/10/2018 at Unknown time  . meloxicam (MOBIC) 15 MG tablet Take 15 mg by mouth daily.   07/11/2018 at 0800  . Spacer/Aero-Holding Chambers DEVI 1 Units by Does not apply route as needed. 1 each 0   . traMADol (ULTRAM) 50 MG tablet Take 50 mg by mouth at bedtime.   3 07/10/2018 at 2100   Social History   Socioeconomic History  . Marital status: Widowed    Spouse name: Not on file  . Number of children: Not on file  . Years of education: Not on file  . Highest education level: Not on file  Occupational History  . Not on file  Social Needs  . Financial resource strain: Not on file  . Food insecurity:    Worry: Not on file    Inability: Not on file  . Transportation needs:    Medical: Not on file    Non-medical: Not on file  Tobacco Use  . Smoking status: Never Smoker  . Smokeless tobacco: Never Used  Substance and Sexual Activity  . Alcohol use: No  . Drug use: No  . Sexual activity: Not on file  Lifestyle  . Physical activity:    Days per week: Not on file    Minutes per session: Not on file  . Stress: Not on  file  Relationships  . Social connections:    Talks on phone: Not on file    Gets together: Not on file    Attends religious service: Not on file    Active member of club or organization: Not on file    Attends meetings of clubs or organizations: Not on file    Relationship status: Not on file  . Intimate partner violence:    Fear of current or ex partner: Not on file    Emotionally abused: Not on file    Physically abused: Not on file    Forced sexual activity: Not on file  Other Topics Concern  . Not on file  Social History Narrative  . Not on file    Family History  Problem Relation Age of Onset  . Hypertension Mother   . Stroke Mother   . Diabetes Daughter   .  Hyperlipidemia Daughter       Review of systems complete and found to be negative unless listed above      PHYSICAL EXAM  General: Well developed, well nourished, in no acute distress HEENT:  Normocephalic and atramatic Neck:  No JVD.  Lungs: Clear bilaterally to auscultation and percussion. Heart: HRRR . Normal S1 and S2 without gallops or murmurs.  Abdomen: Bowel sounds are positive, abdomen soft and non-tender  Msk:  Back normal, normal gait. Normal strength and tone for age. Extremities: No clubbing, cyanosis or edema.   Neuro: Alert and oriented X 3. Psych:  Good affect, responds appropriately  Labs:   Lab Results  Component Value Date   WBC 10.3 07/11/2018   HGB 9.5 (L) 07/11/2018   HCT 30.4 (L) 07/11/2018   MCV 100.0 07/11/2018   PLT 433 (H) 07/11/2018    Recent Labs  Lab 07/11/18 1019  NA 134*  K 4.8  CL 97*  CO2 27  BUN 41*  CREATININE 0.69  CALCIUM 9.3  PROT 7.3  BILITOT 0.9  ALKPHOS 106  ALT 63*  AST 50*  GLUCOSE 205*   Lab Results  Component Value Date   TROPONINI <0.03 07/11/2018    Lab Results  Component Value Date   CHOL 193 11/15/2014   Lab Results  Component Value Date   HDL 59 11/15/2014   Lab Results  Component Value Date   LDLCALC 114 11/15/2014   Lab Results  Component Value Date   TRIG 99 11/15/2014   No results found for: CHOLHDL No results found for: LDLDIRECT    Radiology: Dg Chest Portable 1 View  Result Date: 07/11/2018 CLINICAL DATA:  Shortness of breath EXAM: PORTABLE CHEST 1 VIEW COMPARISON:  06/29/2018, 06/30/2018 FINDINGS: There is mild bilateral interstitial prominence. There are low lung volumes likely partially accounting for the interstitial prominence. There is no focal consolidation, pleural effusion or pneumothorax. There is stable cardiomegaly. There is a dual lead cardiac pacemaker. The osseous structures are unremarkable. IMPRESSION: 1. Cardiomegaly with mild pulmonary vascular congestion. Interstitial  markings are accentuated by low lung volumes. Electronically Signed   By: Elige KoHetal  Patel   On: 07/11/2018 11:01   Dg Chest Port 1 View  Result Date: 06/30/2018 CLINICAL DATA:  Postop pacemaker insertion. EXAM: PORTABLE CHEST 1 VIEW COMPARISON:  06/29/2018 FINDINGS: The patient is status post placement of a dual chamber left-sided pacemaker. Of or is no evidence of a pneumothorax. The cardiac silhouette remains enlarged. There are prominent interstitial lung markings bilaterally consistent with pulmonary edema. The lung volumes are low. Bibasilar atelectasis is noted. There  is no acute osseous abnormality. IMPRESSION: 1. Status post placement of a dual chamber left-sided pacemaker with no evidence of a pneumothorax. 2. Cardiomegaly with findings of pulmonary edema. Electronically Signed   By: Katherine Mantle M.D.   On: 06/30/2018 14:49   Dg Chest Portable 1 View  Result Date: 06/29/2018 CLINICAL DATA:  Cough.  Low oxygen saturation. EXAM: PORTABLE CHEST 1 VIEW COMPARISON:  09/25/2017 FINDINGS: Heart is enlarged. The aorta is tortuous. There is newly seen widespread bilateral pulmonary density. The pattern is nonspecific and could represent pneumonia or heart failure. Possible small amount of pleural effusion on each side. No significant bone finding. IMPRESSION: Cardiomegaly. Widespread abnormal lung density. The differential diagnosis is heart failure versus pneumonia. Electronically Signed   By: Paulina Fusi M.D.   On: 06/29/2018 17:03   Dg C-arm 1-60 Min-no Report  Result Date: 06/30/2018 Fluoroscopy was utilized by the requesting physician.  No radiographic interpretation.    EKG: Atrial sensing with ventricular pacing  ASSESSMENT AND PLAN:   1.  Acute on chronic diastolic congestive heart failure, modest improvement after initial diuresis 2.  Status post dual-chamber pacemaker for second-degree AV block  Recommendations  1.  Agree with current therapy 2.  Continue diuresis 3.   Carefully monitor renal status 4.  Counseled patient about low sodium, DASH diet 5.  Defer further cardiac diagnostics at this time   Signed: Marcina Millard MD,PhD, [facility] 07/11/2018, 3:18 PM

## 2018-07-11 NOTE — ED Notes (Signed)
ED TO INPATIENT HANDOFF REPORT  ED Nurse Name and Phone #: [phone] Jonette Pesa Name/Age/Gender Tiffany Barber 78 y.o. female Room/Bed: ED06A/ED06A  Code Status   Code Status: Prior  Home/SNF/Other Home Patient oriented to: self, place, time and situation Is this baseline? Yes   Triage Complete: Triage complete  Chief Complaint SOB   Triage Note Pt arrives via ems from home where she lives with son. EMS states they were called out for SOB. Pt does not norm wear O2 at home, but son place his o2 on pt due to difficulty breathing. Pt had pace maker placed last week, had stopped lasix for procedure, family started giving lasix again due to peripheral edema. Pt arrived very SOB, with labored breathing, but able to speak in short sentences. MD present at arrival for assessment.    Allergies Allergies  Allergen Reactions  . Ace Inhibitors Other (See Comments)    Other reaction(s): angioedema resulting in intubation Other reaction(s): angioedema resulting in intubation   . Nifedipine Swelling and Shortness Of Breath  . Lisinopril     Level of Care/Admitting Diagnosis ED Disposition    ED Disposition Condition Comment   Admit  Hospital Area: [facility] [[id number]]  Level of Care: Telemetry [5]  Covid Evaluation: N/A  Diagnosis: Acute diastolic CHF (congestive heart failure) [facility]) [[id number]]  Admitting Physician: Shaune Pollack [[id number]]  Attending Physician: Shaune Pollack [[id number]]  Estimated length of stay: past midnight tomorrow  Certification:: I certify this patient will need inpatient services for at least 2 midnights  PT Class (Do Not Modify): Inpatient [101]  PT Acc Code (Do Not Modify): Private [1]       B Medical/Surgery History Past Medical History:  Diagnosis Date  . 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 Hyperlipidemia   . Hypertension   . LVH (left ventricular hypertrophy)   . Stroke [facility])    Past Surgical History:  Procedure  Laterality Date  . JOINT REPLACEMENT Left 2019   left knee  . PACEMAKER INSERTION N/A 06/30/2018   Procedure: INSERTION PACEMAKER;  Surgeon: Marcina Millard, MD;  Location: [facility] ORS;  Service: Cardiovascular;  Laterality: N/A;  . REPLACEMENT TOTAL KNEE BILATERAL       A IV Location/Drains/Wounds Patient Lines/Drains/Airways Status   Active Line/Drains/Airways    Name:   Placement date:   Placement time:   Site:   Days:   Peripheral IV 07/11/18 Right Antecubital   07/11/18    1019    Antecubital   less than 1   Airway 7 mm   -    -        Incision (Closed) 06/30/18 Chest Left   06/30/18    1258     11          Intake/Output Last 24 hours No intake or output data in the 24 hours ending 07/11/18 1322  Labs/Imaging Results for orders placed or performed during the hospital encounter of 07/11/18 (from the past 48 hour(s))  CBC with Differential     Status: Abnormal   Collection Time: 07/11/18 10:19 AM  Result Value Ref Range   WBC 10.3 4.0 - 10.5 K/uL   RBC 3.04 (L) 3.87 - 5.11 MIL/uL   Hemoglobin 9.5 (L) 12.0 - 15.0 g/dL   HCT 56.2 (L) 13.0 - 86.5 %   MCV 100.0 80.0 - 100.0 fL   MCH 31.3 26.0 - 34.0 pg   MCHC 31.3 30.0 - 36.0 g/dL  RDW 14.3 11.5 - 15.5 %   Platelets 433 (H) 150 - 400 K/uL   nRBC 0.0 0.0 - 0.2 %   Neutrophils Relative % 86 %   Neutro Abs 8.8 (H) 1.7 - 7.7 K/uL   Lymphocytes Relative 5 %   Lymphs Abs 0.6 (L) 0.7 - 4.0 K/uL   Monocytes Relative 6 %   Monocytes Absolute 0.7 0.1 - 1.0 K/uL   Eosinophils Relative 2 %   Eosinophils Absolute 0.2 0.0 - 0.5 K/uL   Basophils Relative 0 %   Basophils Absolute 0.0 0.0 - 0.1 K/uL   Immature Granulocytes 1 %   Abs Immature Granulocytes 0.06 0.00 - 0.07 K/uL    Comment: Performed at [facility], [address] [place], [place] [address]  Lactic acid, plasma     Status: None   Collection Time: 07/11/18 10:19 AM  Result Value Ref Range   Lactic Acid, Venous 1.6 0.5 - 1.9 mmol/L    Comment: Performed  at [facility], [address] [place], [place] [address]  Troponin I - Once     Status: None   Collection Time: 07/11/18 10:19 AM  Result Value Ref Range   Troponin I <0.03 <0.03 ng/mL    Comment: Performed at [facility], [address] [place], [place] [address]  Brain natriuretic peptide     Status: Abnormal   Collection Time: 07/11/18 10:19 AM  Result Value Ref Range   B Natriuretic Peptide 145.0 (H) 0.0 - 100.0 pg/mL    Comment: Performed at [facility], [address] [place], [place] [address]  Comprehensive metabolic panel     Status: Abnormal   Collection Time: 07/11/18 10:19 AM  Result Value Ref Range   Sodium 134 (L) 135 - 145 mmol/L   Potassium 4.8 3.5 - 5.1 mmol/L   Chloride 97 (L) 98 - 111 mmol/L   CO2 27 22 - 32 mmol/L   Glucose, Bld 205 (H) 70 - 99 mg/dL   BUN 41 (H) 8 - 23 mg/dL   Creatinine, Ser 6.94 0.44 - 1.00 mg/dL   Calcium 9.3 8.9 - 85.4 mg/dL   Total Protein 7.3 6.5 - 8.1 g/dL   Albumin 3.8 3.5 - 5.0 g/dL   AST 50 (H) 15 - 41 U/L   ALT 63 (H) 0 - 44 U/L   Alkaline Phosphatase 106 38 - 126 U/L   Total Bilirubin 0.9 0.3 - 1.2 mg/dL   GFR calc non Af Amer >60 >60 mL/min   GFR calc Af Amer >60 >60 mL/min   Anion gap 10 5 - 15    Comment: Performed at [facility], [address] [place], [place] [address]  SARS Coronavirus 2 (CEPHEID- Performed in [facility] hospital lab), Hosp Order     Status: None   Collection Time: 07/11/18 10:19 AM  Result Value Ref Range   SARS Coronavirus 2 NEGATIVE NEGATIVE    Comment: (NOTE) If result is NEGATIVE SARS-CoV-2 target nucleic acids are NOT DETECTED. The SARS-CoV-2 RNA is generally detectable in upper and lower  respiratory specimens during the acute phase of infection. The lowest  concentration of SARS-CoV-2 viral copies this assay can detect is 250  copies / mL. A negative result does not preclude SARS-CoV-2 infection  and should not be used as the sole basis for  treatment or other  patient management decisions.  A negative result may occur with  improper specimen collection / handling, submission of specimen other  than nasopharyngeal swab, presence of viral mutation(s) within the  areas targeted by this assay, and inadequate number of viral copies  (<250 copies / mL). A negative result must be combined with clinical  observations, patient history, and epidemiological information. If result is POSITIVE SARS-CoV-2 target nucleic acids are DETECTED. The SARS-CoV-2 RNA is generally detectable in upper and lower  respiratory specimens dur ing the acute phase of infection.  Positive  results are indicative of active infection with SARS-CoV-2.  Clinical  correlation with patient history and other diagnostic information is  necessary to determine patient infection status.  Positive results do  not rule out bacterial infection or co-infection with other viruses. If result is PRESUMPTIVE POSTIVE SARS-CoV-2 nucleic acids MAY BE PRESENT.   A presumptive positive result was obtained on the submitted specimen  and confirmed on repeat testing.  While 2019 novel coronavirus  (SARS-CoV-2) nucleic acids may be present in the submitted sample  additional confirmatory testing may be necessary for epidemiological  and / or clinical management purposes  to differentiate between  SARS-CoV-2 and other Sarbecovirus currently known to infect humans.  If clinically indicated additional testing with an alternate test  methodology [phone]([id number]) is advised. The SARS-CoV-2 RNA is generally  detectable in upper and lower respiratory sp ecimens during the acute  phase of infection. The expected result is Negative. Fact Sheet for Patients:  BoilerBrush.com.cyhttps://www.fda.gov/media/136312/download Fact Sheet for Healthcare Providers: https://pope.com/https://www.fda.gov/media/136313/download This test is not yet approved or cleared by the [place] FDA and has been authorized for detection and/or  diagnosis of SARS-CoV-2 by FDA under an Emergency Use Authorization (EUA).  This EUA will remain in effect (meaning this test can be used) for the duration of the COVID-19 declaration under Section 564(b)(1) of the Act, 21 U.S.C. section 360bbb-3(b)(1), unless the authorization is terminated or revoked sooner. Performed at [facility], [address] [place], [place] [id number]    Dg Chest Portable 1 View  Result Date: 07/11/2018 CLINICAL DATA:  Shortness of breath EXAM: PORTABLE CHEST 1 VIEW COMPARISON:  06/29/2018, 06/30/2018 FINDINGS: There is mild bilateral interstitial prominence. There are low lung volumes likely partially accounting for the interstitial prominence. There is no focal consolidation, pleural effusion or pneumothorax. There is stable cardiomegaly. There is a dual lead cardiac pacemaker. The osseous structures are unremarkable. IMPRESSION: 1. Cardiomegaly with mild pulmonary vascular congestion. Interstitial markings are accentuated by low lung volumes. Electronically Signed   By: Elige KoHetal  Patel   On: 07/11/2018 11:01    Pending Labs Unresulted Labs (From admission, onward)    Start     Ordered   07/11/18 1014  Urinalysis, Complete w Microscopic  ONCE - STAT,   STAT     07/11/18 1013   Signed and Held  Basic metabolic panel  Tomorrow morning,   R     Signed and Held   Signed and Held  CBC  Tomorrow morning,   R     Signed and Held   Signed and Held  Creatinine, serum  (enoxaparin (LOVENOX)    CrCl >/= 30 ml/min)  Weekly,   R    Comments:  while on enoxaparin therapy    Signed and Held   Signed and Held  Magnesium  Add-on,   R     Signed and Held   Medical illustratorigned and Held  Basic metabolic panel  Daily,   R     Signed and Held          Vitals/Pain Today's Vitals  07/11/18 1143 07/11/18 1200 07/11/18 1230 07/11/18 1300  BP: (!) 121/93 (!) 159/60 (!) 152/66 130/86  Pulse: 87 94 89 89  Resp: (!) 24 20 (!) 21 (!) 23  Temp:      TempSrc:      SpO2: 98% 100%  98% 99%  Weight:      Height:      PainSc:        Isolation Precautions Droplet and Contact precautions  Medications Medications  albuterol (PROVENTIL) (2.5 MG/3ML) 0.083% nebulizer solution 2.5 mg (2.5 mg Nebulization Given 07/11/18 1109)  albuterol (PROVENTIL) (2.5 MG/3ML) 0.083% nebulizer solution 2.5 mg (2.5 mg Nebulization Given 07/11/18 1110)  furosemide (LASIX) injection 40 mg (40 mg Intravenous Given 07/11/18 1139)    Mobility walks with person assist Low fall risk   Focused Assessments Pulmonary Assessment Handoff:  Lung sounds:   O2 Device: Nasal Cannula O2 Flow Rate (L/min): 2 L/min      R Recommendations: See Admitting Provider Note  Report given to:   Additional Notes: None

## 2018-07-11 NOTE — ED Provider Notes (Signed)
[facility] Emergency Department Provider Note ____________________________________________   First MD Initiated Contact with Patient 07/11/18 1001     (approximate)  I have reviewed the triage vital signs and the nursing notes.   HISTORY  Chief Complaint Shortness of Breath    HPI Tiffany Barber is a 78 y.o. female with PMH as noted below status post recent admission for pacemaker placement who presents with shortness of breath, gradual onset over the last 3 days, and associated with some shoulder pain and weakness.  The patient states that she is not normally on oxygen, but started using her son's oxygen tank in the last few days due to the shortness of breath.  She denies cough, fever, vomiting or diarrhea, or chest pain.  Past Medical History:  Diagnosis Date  . 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 Hyperlipidemia   . Hypertension   . LVH (left ventricular hypertrophy)   . Stroke [facility](HCC)     Patient Active Problem List   Diagnosis Date Noted  . Acute diastolic CHF (congestive heart failure) (HCC) 07/11/2018  . Physical deconditioning 07/08/2018  . Poor balance 07/08/2018  . At risk for falls 07/08/2018  . Dizziness 10/12/2017  . HLD (hyperlipidemia) 11/20/2015  . RBBB 09/02/2015  . Abnormal ECG 09/02/2015  . History of TIAs 09/02/2015  . DOE (dyspnea on exertion) 09/02/2015  . Hypertension 12/01/2014  . Degenerative arthritis 11/21/2014  . Primary localized osteoarthrosis, lower leg 07/30/2010  . Angioedema 07/04/2009  . Cerebrovascular accident (CVA) (HCC) 07/04/2009    Past Surgical History:  Procedure Laterality Date  . JOINT REPLACEMENT Left 2019   left knee  . PACEMAKER INSERTION N/A 06/30/2018   Procedure: INSERTION PACEMAKER;  Surgeon: Marcina MillardParaschos, Alexander, MD;  Location: [facility] ORS;  Service: Cardiovascular;  Laterality: N/A;  . REPLACEMENT TOTAL KNEE BILATERAL      Prior to Admission medications   Medication  Sig Start Date End Date Taking? Authorizing Provider  albuterol (VENTOLIN HFA) 108 (90 Base) MCG/ACT inhaler Inhale 2 puffs into the lungs every 4 (four) hours as needed for wheezing. 07/08/18  Yes Emi BelfastGessner, Deborah B, FNP  amLODipine (NORVASC) 10 MG tablet Take 10 mg by mouth daily.   Yes [provider]  aspirin 81 MG EC tablet Take 81 mg by mouth 2 (two) times a day.    Yes [provider]  hydrochlorothiazide (HYDRODIURIL) 25 MG tablet Take 25 mg by mouth daily. 05/23/14  Yes [provider]  lovastatin (MEVACOR) 20 MG tablet Take 20 mg by mouth daily.    Yes [provider]  meloxicam (MOBIC) 15 MG tablet Take 15 mg by mouth daily. 05/23/14  Yes [provider]  Spacer/Aero-Holding Chambers DEVI 1 Units by Does not apply route as needed. 07/08/18  Yes Emi BelfastGessner, Deborah B, FNP  traMADol (ULTRAM) 50 MG tablet Take 50 mg by mouth at bedtime.    Yes [provider]    Allergies Ace inhibitors; Nifedipine; and Lisinopril  Family History  Problem Relation Age of Onset  . Hypertension Mother   . Stroke Mother   . Diabetes Daughter   . Hyperlipidemia Daughter     Social History Social History   Tobacco Use  . Smoking status: Never Smoker  . Smokeless tobacco: Never Used  Substance Use Topics  . Alcohol use: No  . Drug use: No    Review of Systems  Constitutional: No fever. Eyes: No redness. ENT: No sore throat. Cardiovascular:  Denies chest pain. Respiratory: Positive for shortness of breath. Gastrointestinal: No vomiting or diarrhea.  Genitourinary: Negative for flank pain.  Musculoskeletal: Negative for back pain. Skin: Negative for rash. Neurological: Negative for headache.   ____________________________________________   PHYSICAL EXAM:  VITAL SIGNS: ED Triage Vitals [07/11/18 1010]  Enc Vitals Group     BP      Pulse      Resp      Temp      Temp src      SpO2 96 %     Weight      Height      Head  Circumference      Peak Flow      Pain Score      Pain Loc      Pain Edu?      Excl. in GC?     Constitutional: Alert and oriented.  Uncomfortable appearing but in no acute distress.   Eyes: Conjunctivae are normal.  Head: Atraumatic. Nose: No congestion/rhinnorhea. Mouth/Throat: Mucous membranes are moist.   Neck: Normal range of motion.  Cardiovascular: Normal rate, regular rhythm. Grossly normal heart sounds.  Good peripheral circulation. Respiratory: Increased respiratory effort..  Diminished breath sounds bilaterally with no obvious wheeze or rales. Gastrointestinal: Soft and nontender. No distention.  Genitourinary: No flank tenderness. Musculoskeletal: 2+ bilateral lower extremity edema.  Extremities warm and well perfused.  Neurologic:  Normal speech and language. No gross focal neurologic deficits are appreciated.  Skin:  Skin is warm and dry. No rash noted. Psychiatric: Anxious appearing.  ____________________________________________   LABS (all labs ordered are listed, but only abnormal results are displayed)  Labs Reviewed  CBC WITH DIFFERENTIAL/PLATELET - Abnormal; Notable for the following components:      Result Value   RBC 3.04 (*)    Hemoglobin 9.5 (*)    HCT 30.4 (*)    Platelets 433 (*)    Neutro Abs 8.8 (*)    Lymphs Abs 0.6 (*)    All other components within normal limits  BRAIN NATRIURETIC PEPTIDE - Abnormal; Notable for the following components:   B Natriuretic Peptide 145.0 (*)    All other components within normal limits  COMPREHENSIVE METABOLIC PANEL - Abnormal; Notable for the following components:   Sodium 134 (*)    Chloride 97 (*)    Glucose, Bld 205 (*)    BUN 41 (*)    AST 50 (*)    ALT 63 (*)    All other components within normal limits  MAGNESIUM - Abnormal; Notable for the following components:   Magnesium 3.2 (*)    All other components within normal limits  SARS CORONAVIRUS 2 (HOSPITAL ORDER, PERFORMED IN [facility]  LAB)  LACTIC ACID, PLASMA  TROPONIN I  URINALYSIS, COMPLETE (UACMP) WITH MICROSCOPIC   ____________________________________________  EKG  ED ECG REPORT I, Dionne Bucy, the attending physician, personally viewed and interpreted this ECG.  Date: 07/11/2018 EKG Time: 1007 Rate: 94 Rhythm: Paced rhythm ST/T Wave abnormalities: normal Narrative Interpretation: no evidence of acute ischemia; no significant change when compared to EKG of 07/01/2018  ____________________________________________  RADIOLOGY  CXR: Cardiomegaly with pulmonary vascular congestion  ____________________________________________   PROCEDURES  Procedure(s) performed: No  Procedures  Critical Care performed: Yes  CRITICAL CARE Performed by: Dionne Bucy   Total critical care time: 30 minutes  Critical care time was exclusive of separately billable procedures and treating other patients.  Critical care was necessary to treat or prevent imminent or  life-threatening deterioration.  Critical care was time spent personally by me on the following activities: development of treatment plan with patient and/or surrogate as well as nursing, discussions with consultants, evaluation of patient's response to treatment, examination of patient, obtaining history from patient or surrogate, ordering and performing treatments and interventions, ordering and review of laboratory studies, ordering and review of radiographic studies, pulse oximetry and re-evaluation of patient's condition. ____________________________________________   INITIAL IMPRESSION / ASSESSMENT AND PLAN / ED COURSE  Pertinent labs & imaging results that were available during my care of the patient were reviewed by me and considered in my medical decision making (see chart for details).  78 year old female with PMH as noted above presents with shortness of breath over the last several days.  The patient is not on home O2 but started  using her son's oxygen in the last few days.  She reports some shoulder pain but denies chest pain, cough, or fever.  She does have worsening lower extremity edema in the last few days.  I reviewed the past medical records in Epic.  The patient was admitted earlier this month with new onset heart block and possible pulmonary edema and a pacemaker was placed.  Echocardiogram from 5/15 shows normal ejection fraction.  The patient was COVID negative earlier this month.  On exam, the patient is anxious and uncomfortable appearing, with increased work of breathing.  Her vital signs are normal except for hypertension.  Per EMS, her O2 saturation was 96% on 3 L in the field, and has been in the mid 90s on 2 L here.  She has diminished breath sounds bilaterally with no significant rales or wheezes.  There is 2+ bilateral lower extremity edema.  Differential includes new onset CHF/fluid overload, ACS, acute bronchitis, pneumonia.  I have a lower suspicion for pulmonary embolism given the gradual onset of the symptoms and the lack of chest pain, however given her hypoxia and the lack of significant wheeze or rales, this is also a possibility.  We will obtain chest x-ray, lab work-up, and reassess.  If the chest x-ray shows no acute findings I will proceed with CT chest to rule out PE.  We will also retest the patient for COVID-19.  ----------------------------------------- 3:29 PM on 07/11/2018 -----------------------------------------  Work-up overall most consistent with fluid overload/CHF, despite the normal recent ejection fraction.  I have ordered IV Lasix.  Patient will require admission for diuresis and due to her hypoxia.  I signed her out to the hospitalist.  ___________________________  Rainey Pines was evaluated in Emergency Department on 07/11/2018 for the symptoms described in the history of present illness. She was evaluated in the context of the global COVID-19 pandemic, which necessitated  consideration that the patient might be at risk for infection with the SARS-CoV-2 virus that causes COVID-19. Institutional protocols and algorithms that pertain to the evaluation of patients at risk for COVID-19 are in a state of rapid change based on information released by regulatory bodies including the CDC and federal and state organizations. These policies and algorithms were followed during the patient's care in the ED. ____________________________________________   FINAL CLINICAL IMPRESSION(S) / ED DIAGNOSES  Final diagnoses:  Acute respiratory failure with hypoxia [facility])  Pulmonary vascular congestion      NEW MEDICATIONS STARTED DURING THIS VISIT:  Current Discharge Medication List       Note:  This document was prepared using Dragon voice recognition software and may include unintentional dictation errors.    Dionne Bucy, MD  07/11/18 1530  

## 2018-07-12 LAB — BASIC METABOLIC PANEL
Anion gap: 13 (ref 5–15)
BUN: 44 mg/dL — ABNORMAL HIGH (ref 8–23)
CO2: 28 mmol/L (ref 22–32)
Calcium: 9 mg/dL (ref 8.9–10.3)
Chloride: 94 mmol/L — ABNORMAL LOW (ref 98–111)
Creatinine, Ser: 0.76 mg/dL (ref 0.44–1.00)
GFR calc Af Amer: >60 mL/min (ref >60)
GFR calc non Af Amer: >60 mL/min (ref >60)
Glucose, Bld: 138 mg/dL — ABNORMAL HIGH (ref 70–99)
Potassium: 4.2 mmol/L (ref 3.5–5.1)
Sodium: 135 mmol/L (ref 135–145)

## 2018-07-12 LAB — CBC
HCT: 27.3 % — ABNORMAL LOW (ref 36.0–46.0)
Hemoglobin: 8.8 g/dL — ABNORMAL LOW (ref 12.0–15.0)
MCH: 31.4 pg (ref 26.0–34.0)
MCHC: 32.2 g/dL (ref 30.0–36.0)
MCV: 97.5 fL (ref 80.0–100.0)
Platelets: 409 10*3/uL — ABNORMAL HIGH (ref 150–400)
RBC: 2.8 MIL/uL — ABNORMAL LOW (ref 3.87–5.11)
RDW: 14.1 % (ref 11.5–15.5)
WBC: 10.5 10*3/uL (ref 4.0–10.5)
nRBC: 0 % (ref 0.0–0.2)

## 2018-07-12 MED ORDER — ENOXAPARIN SODIUM 40 MG/0.4ML ~~LOC~~ SOLN
40.0000 mg | Freq: Two times a day (BID) | SUBCUTANEOUS | Status: DC
  Administered 2018-07-12 – 2018-07-18 (×12): 40 mg via SUBCUTANEOUS
  Filled 2018-07-12 (×12): qty 0.4

## 2018-07-12 MED ORDER — MAGNESIUM CITRATE PO SOLN
1.0000 | Freq: Once | ORAL | Status: AC
  Administered 2018-07-12: 1 via ORAL
  Filled 2018-07-12: qty 296

## 2018-07-12 NOTE — Progress Notes (Signed)
PHARMACIST - PHYSICIAN COMMUNICATION  CONCERNING:  Enoxaparin (Lovenox) for DVT Prophylaxis    RECOMMENDATION: Patient was prescribed enoxaprin 40mg  q24 hours for VTE prophylaxis.   Filed Weights   07/11/18 1016 07/12/18 0403  Weight: 195 lb (88.5 kg) 212 lb 3.2 oz (96.3 kg)    Body mass index is 41.44 kg/m.  Estimated Creatinine Clearance: 59.2 mL/min (by C-G formula based on SCr of 0.76 mg/dL).   Based on [facility] policy patient is candidate for enoxaparin 40mg  every 12 hour dosing due to BMI being >40.   DESCRIPTION: Pharmacy has adjusted enoxaparin dose per [facility] policy.  Patient is now receiving enoxaparin 40mg  every 12 hours.     Katha Cabal, PharmD Clinical Pharmacist  07/12/2018 2:58 PM

## 2018-07-12 NOTE — Progress Notes (Signed)
[employer] - [place] at [facility]   PATIENT NAME: Tiffany PinesViolet Barber    MR#:  [id number]  DATE OF BIRTH:  04/22/1940  SUBJECTIVE:   patient here with fluid overload Feels a little better   REVIEW OF SYSTEMS:    Review of Systems  Constitutional: Negative for fever, chills weight loss HENT: Negative for ear pain, nosebleeds, congestion, facial swelling, rhinorrhea, neck pain, neck stiffness and ear discharge.   Respiratory: Negative for cough, ++shortness of breath, wheezing  Cardiovascular: Negative for chest pain, palpitations and ++leg swelling.  Gastrointestinal: Negative for heartburn, abdominal pain, vomiting, diarrhea or consitpation Genitourinary: Negative for dysuria, urgency, frequency, hematuria Musculoskeletal: Negative for back pain or joint pain Neurological: Negative for dizziness, seizures, syncope, focal weakness,  numbness and headaches.  Hematological: Does not bruise/bleed easily.  Psychiatric/Behavioral: Negative for hallucinations, confusion, dysphoric mood    Tolerating Diet: yes      DRUG ALLERGIES:   Allergies  Allergen Reactions  . Ace Inhibitors Other (See Comments)    Other reaction(s): angioedema resulting in intubation Other reaction(s): angioedema resulting in intubation   . Nifedipine Swelling and Shortness Of Breath  . Lisinopril     VITALS:  Blood pressure (!) 155/77, pulse 87, temperature (!) 97.5 F (36.4 C), temperature source Oral, resp. rate 20, height 5' (1.524 m), weight 96.3 kg, SpO2 98 %.  PHYSICAL EXAMINATION:  Constitutional: Appears well-developed and well-nourished. No distress. HENT: Normocephalic. Marland Kitchen. Oropharynx is clear and moist.  Eyes: Conjunctivae and EOM are normal. PERRLA, no scleral icterus.  Neck: Normal ROM. Neck supple. No JVD. No tracheal deviation. CVS: RRR, S1/S2 +, no murmurs, no gallops, no carotid bruit.  Pulmonary: Effort and breath sounds normal, no stridor, rhonchi, wheezes, rales.   Abdominal: Soft. BS +,  no distension, tenderness, rebound or guarding.  Musculoskeletal: Normal range of motion. and no tenderness.  Neuro: Alert. CN 2-12 grossly intact. No focal deficits. Skin: Skin is warm and dry. No rash noted. Psychiatric: Normal mood and affect.      LABORATORY PANEL:   CBC Recent Labs  Lab 07/12/18 0434  WBC 10.5  HGB 8.8*  HCT 27.3*  PLT 409*   ------------------------------------------------------------------------------------------------------------------  Chemistries  Recent Labs  Lab 07/11/18 1019 07/12/18 0434  NA 134* 135  K 4.8 4.2  CL 97* 94*  CO2 27 28  GLUCOSE 205* 138*  BUN 41* 44*  CREATININE 0.69 0.76  CALCIUM 9.3 9.0  MG 3.2*  --   AST 50*  --   ALT 63*  --   ALKPHOS 106  --   BILITOT 0.9  --    ------------------------------------------------------------------------------------------------------------------  Cardiac Enzymes Recent Labs  Lab 07/11/18 1019  TROPONINI <0.03   ------------------------------------------------------------------------------------------------------------------  RADIOLOGY:  Dg Chest Port 1 View  Result Date: 07/11/2018 CLINICAL DATA:  Shortness of breath EXAM: PORTABLE CHEST 1 VIEW COMPARISON:  Jul 11, 2018 study obtained earlier in the day FINDINGS: Cardiomegaly is stable. Pacemaker leads are attached to the right atrium and right ventricle. There is atelectatic change in the mid lung regions bilaterally. There is no frank edema or consolidation. No adenopathy evident. No bone lesions. IMPRESSION: Stable cardiomegaly with pacemaker lead positions unchanged. Atelectatic change in each mid lung. No edema or consolidation evident. Electronically Signed   By: Bretta BangWilliam  Woodruff III M.D.   On: 07/11/2018 23:49   Dg Chest Portable 1 View  Result Date: 07/11/2018 CLINICAL DATA:  Shortness of breath EXAM: PORTABLE CHEST 1 VIEW COMPARISON:  06/29/2018, 06/30/2018 FINDINGS: There  is mild bilateral  interstitial prominence. There are low lung volumes likely partially accounting for the interstitial prominence. There is no focal consolidation, pleural effusion or pneumothorax. There is stable cardiomegaly. There is a dual lead cardiac pacemaker. The osseous structures are unremarkable. IMPRESSION: 1. Cardiomegaly with mild pulmonary vascular congestion. Interstitial markings are accentuated by low lung volumes. 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  1.  Acute hypoxic respiratory failure in the setting of acute on chronic diastolic heart failure with preserved ejection fraction: Wean oxygen to room air Patient still with CHF exacerbation.  Continue Lasix.  Monitor intake and output with daily weight.  CHF clinic upon discharge. Cardiology consultation appreciated. BNP is improving 2.  Essential hypertension: Continue Coreg and Norvasc  3.  Hyperlipidemia: Continue statin  4.  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  Anemia of chronic disease: Monitor hemoglobin  PT for d/c plan  Management plans discussed with the patient and she is in agreement.  CODE STATUS: full  TOTAL TIME TAKING CARE OF THIS PATIENT: 30 minutes.     POSSIBLE D/C 1-2 days, DEPENDING ON CLINICAL CONDITION.   Adrian Saran M.D on 07/12/2018 at 1:49 PM  Between 7am to 6pm - Pager - [phone] After 6pm go to [email or web address] - password [email or web address]  [facility] [place] [facility]  Office  [phone]  CC: Primary care physician; Emi Belfast, FNP  Note: This dictation was prepared with Dragon dictation along with smaller phrase technology. Any transcriptional errors that result from this process are unintentional.

## 2018-07-12 NOTE — Progress Notes (Signed)
[facility]  SUBJECTIVE: Patient laying flat in bed, reports feeling better   Vitals:   07/11/18 2246 07/12/18 0403 07/12/18 0758 07/12/18 0758  BP: (!) 144/71 127/78 (!) 155/77 (!) 155/77  Pulse: 78 92 85 87  Resp: (!) 24 20    Temp:  98.8 F (37.1 C) (!) 97.5 F (36.4 C) (!) 97.5 F (36.4 C)  TempSrc:  Oral Oral Oral  SpO2: 96% 95% 96% 97%  Weight:  96.3 kg    Height:         Intake/Output Summary (Last 24 hours) at 07/12/2018 0850 Last data filed at 07/12/2018 9532 Gross per 24 hour  Intake 240 ml  Output 1350 ml  Net -1110 ml      PHYSICAL EXAM  General: Well developed, well nourished, in no acute distress HEENT:  Normocephalic and atramatic Neck:  No JVD.  Lungs: Clear bilaterally to auscultation and percussion. Heart: HRRR . Normal S1 and S2 without gallops or murmurs.  Abdomen: Bowel sounds are positive, abdomen soft and non-tender  Msk:  Back normal, normal gait. Normal strength and tone for age. Extremities: No clubbing, cyanosis or edema.   Neuro: Alert and oriented X 3. Psych:  Good affect, responds appropriately   LABS: Basic Metabolic Panel: Recent Labs    07/11/18 1019 07/12/18 0434  NA 134* 135  K 4.8 4.2  CL 97* 94*  CO2 27 28  GLUCOSE 205* 138*  BUN 41* 44*  CREATININE 0.69 0.76  CALCIUM 9.3 9.0  MG 3.2*  --    Liver Function Tests: Recent Labs    07/11/18 1019  AST 50*  ALT 63*  ALKPHOS 106  BILITOT 0.9  PROT 7.3  ALBUMIN 3.8   No results for input(s): LIPASE, AMYLASE in the last 72 hours. CBC: Recent Labs    07/11/18 1019 07/12/18 0434  WBC 10.3 10.5  NEUTROABS 8.8*  --   HGB 9.5* 8.8*  HCT 30.4* 27.3*  MCV 100.0 97.5  PLT 433* 409*   Cardiac Enzymes: Recent Labs    07/11/18 1019  TROPONINI <0.03   BNP: Invalid input(s): POCBNP D-Dimer: No results for input(s): DDIMER in the last 72 hours. Hemoglobin A1C: No results for input(s): HGBA1C in the last 72 hours. Fasting Lipid Panel: No results for  input(s): CHOL, HDL, LDLCALC, TRIG, CHOLHDL, LDLDIRECT in the last 72 hours. Thyroid Function Tests: No results for input(s): TSH, T4TOTAL, T3FREE, THYROIDAB in the last 72 hours.  Invalid input(s): FREET3 Anemia Panel: No results for input(s): VITAMINB12, FOLATE, FERRITIN, TIBC, IRON, RETICCTPCT in the last 72 hours.  Dg Chest Port 1 View  Result Date: 07/11/2018 CLINICAL DATA:  Shortness of breath EXAM: PORTABLE CHEST 1 VIEW COMPARISON:  Jul 11, 2018 study obtained earlier in the day FINDINGS: Cardiomegaly is stable. Pacemaker leads are attached to the right atrium and right ventricle. There is atelectatic change in the mid lung regions bilaterally. There is no frank edema or consolidation. No adenopathy evident. No bone lesions. IMPRESSION: Stable cardiomegaly with pacemaker lead positions unchanged. Atelectatic change in each mid lung. No edema or consolidation evident. Electronically Signed   By: Bretta Bang III M.D.   On: 07/11/2018 23:49   Dg Chest Portable 1 View  Result Date: 07/11/2018 CLINICAL DATA:  Shortness of breath EXAM: PORTABLE CHEST 1 VIEW COMPARISON:  06/29/2018, 06/30/2018 FINDINGS: There is mild bilateral interstitial prominence. There are low lung volumes likely partially accounting for the interstitial prominence. There is no focal consolidation, pleural effusion or pneumothorax.  There is stable cardiomegaly. There is a dual lead cardiac pacemaker. The osseous structures are unremarkable. IMPRESSION: 1. Cardiomegaly with mild pulmonary vascular congestion. Interstitial markings are accentuated by low lung volumes. Electronically Signed   By: Elige KoHetal  Patel   On: 07/11/2018 11:01     Echo LVEF 60 to 65%  TELEMETRY: Atrial sensing ventricular pacing:  ASSESSMENT AND PLAN:  Active Problems:   Acute diastolic CHF (congestive heart failure) (HCC)    1.  Acute on chronic diastolic congestive heart failure, improved after diuresis 2.  Status post dual-chamber  pacemaker for second-degree AV block  Recommendations  1.  Agree with current therapy 2.  Continue diuresis 3.  Carefully monitor renal status 4.  Defer further cardiac diagnostics  Off for now, please call if any questions   Marcina MillardAlexander Savannah Erbe, MD, PhD, [facility] Hospital[facility]ACC 07/12/2018 8:50 AM

## 2018-07-13 LAB — URINALYSIS, COMPLETE (UACMP) WITH MICROSCOPIC
Bacteria, UA: NONE SEEN
Bilirubin Urine: NEGATIVE
Glucose, UA: NEGATIVE mg/dL
Hgb urine dipstick: NEGATIVE
Ketones, ur: NEGATIVE mg/dL
Nitrite: NEGATIVE
Protein, ur: 30 mg/dL — AB
Specific Gravity, Urine: 1.012 (ref 1.005–1.030)
pH: 5 (ref 5.0–8.0)

## 2018-07-13 LAB — BASIC METABOLIC PANEL
Anion gap: 12 (ref 5–15)
BUN: 56 mg/dL — ABNORMAL HIGH (ref 8–23)
CO2: 29 mmol/L (ref 22–32)
Calcium: 8.8 mg/dL — ABNORMAL LOW (ref 8.9–10.3)
Chloride: 92 mmol/L — ABNORMAL LOW (ref 98–111)
Creatinine, Ser: 0.95 mg/dL (ref 0.44–1.00)
GFR calc Af Amer: >60 mL/min (ref >60)
GFR calc non Af Amer: 57 mL/min — ABNORMAL LOW (ref >60)
Glucose, Bld: 131 mg/dL — ABNORMAL HIGH (ref 70–99)
Potassium: 4.1 mmol/L (ref 3.5–5.1)
Sodium: 133 mmol/L — ABNORMAL LOW (ref 135–145)

## 2018-07-13 MED ORDER — FLEET ENEMA 7-19 GM/118ML RE ENEM
1.0000 | ENEMA | Freq: Once | RECTAL | Status: AC
  Administered 2018-07-13: 1 via RECTAL

## 2018-07-13 MED ORDER — SENNA 8.6 MG PO TABS
1.0000 | ORAL_TABLET | Freq: Every day | ORAL | Status: DC
  Administered 2018-07-13: 8.6 mg via ORAL

## 2018-07-13 MED ORDER — BISACODYL 10 MG RE SUPP: 10.0000 mg | Freq: Every day | RECTAL | Status: DC

## 2018-07-13 MED ORDER — FUROSEMIDE 10 MG/ML IJ SOLN
INTRAMUSCULAR | Status: AC
  Administered 2018-07-13: 80 mg
  Filled 2018-07-13: qty 8

## 2018-07-13 MED ORDER — IPRATROPIUM-ALBUTEROL 0.5-2.5 (3) MG/3ML IN SOLN
3.0000 mL | Freq: Three times a day (TID) | RESPIRATORY_TRACT | Status: DC
  Administered 2018-07-14 – 2018-07-19 (×17): 3 mL via RESPIRATORY_TRACT
  Filled 2018-07-13 (×16): qty 3

## 2018-07-13 MED ORDER — LORAZEPAM 2 MG/ML IJ SOLN
0.5000 mg | Freq: Four times a day (QID) | INTRAMUSCULAR | Status: DC | PRN
  Administered 2018-07-13 – 2018-07-14 (×3): 0.5 mg via INTRAVENOUS
  Filled 2018-07-13 (×3): qty 1

## 2018-07-13 MED ORDER — IPRATROPIUM-ALBUTEROL 0.5-2.5 (3) MG/3ML IN SOLN
3.0000 mL | RESPIRATORY_TRACT | Status: DC
  Administered 2018-07-13 (×3): 3 mL via RESPIRATORY_TRACT
  Filled 2018-07-13 (×3): qty 3

## 2018-07-13 MED ORDER — CARVEDILOL 6.25 MG PO TABS
6.2500 mg | ORAL_TABLET | Freq: Two times a day (BID) | ORAL | Status: DC
  Administered 2018-07-13 – 2018-07-14 (×2): 6.25 mg via ORAL
  Filled 2018-07-13 (×2): qty 1

## 2018-07-13 MED ORDER — DOCUSATE SODIUM 100 MG PO CAPS
100.0000 mg | ORAL_CAPSULE | Freq: Two times a day (BID) | ORAL | Status: DC
  Administered 2018-07-13 (×2): 100 mg via ORAL
  Filled 2018-07-13 (×2): qty 1

## 2018-07-13 MED ORDER — BENZONATATE 100 MG PO CAPS
200.0000 mg | ORAL_CAPSULE | Freq: Three times a day (TID) | ORAL | Status: DC
  Administered 2018-07-13 – 2018-07-14 (×4): 200 mg via ORAL
  Filled 2018-07-13 (×4): qty 2

## 2018-07-13 MED ORDER — FUROSEMIDE 10 MG/ML IJ SOLN
80.0000 mg | Freq: Two times a day (BID) | INTRAMUSCULAR | Status: DC
  Administered 2018-07-13 – 2018-07-18 (×11): 80 mg via INTRAVENOUS
  Filled 2018-07-13 (×12): qty 8

## 2018-07-13 NOTE — Progress Notes (Signed)
[employer] - [place] [facility]   PATIENT NAME: Tiffany Barber    MR#:  [id number]  DATE OF BIRTH:  05/20/1940  SUBJECTIVE:   patient feeling sob this am with wheezing  REVIEW OF SYSTEMS:    Review of Systems  Constitutional: Negative for fever, chills weight loss HENT: Negative for ear pain, nosebleeds, congestion, facial swelling, rhinorrhea, neck pain, neck stiffness and ear discharge.   Respiratory: Negative for cough, ++shortness of breath, wheezing  Cardiovascular: Negative for chest pain, palpitations and ++leg swelling.  Gastrointestinal: Negative for heartburn, abdominal pain, vomiting, diarrhea  ++ consitpation Genitourinary: Negative for dysuria, urgency, frequency, hematuria Musculoskeletal: Negative for back pain or joint pain Neurological: Negative for dizziness, seizures, syncope, focal weakness,  numbness and headaches.  Hematological: Does not bruise/bleed easily.  Psychiatric/Behavioral: Negative for hallucinations, confusion, dysphoric mood    Tolerating Diet: yes      DRUG ALLERGIES:   Allergies  Allergen Reactions  . Ace Inhibitors Other (See Comments)    Other reaction(s): angioedema resulting in intubation Other reaction(s): angioedema resulting in intubation   . Nifedipine Swelling and Shortness Of Breath  . Lisinopril     VITALS:  Blood pressure (!) 145/94, pulse 81, temperature (!) 97.5 F (36.4 C), temperature source Oral, resp. rate 20, height 5' (1.524 m), weight 97.2 kg, SpO2 99 %.  PHYSICAL EXAMINATION:  Constitutional: Appears well-developed and well-nourished. No distress. HENT: Normocephalic. Marland Kitchen. Oropharynx is clear and moist.  Eyes: Conjunctivae and EOM are normal. PERRLA, no scleral icterus.  Neck: Normal ROM. Neck supple. No JVD. No tracheal deviation. CVS: RRR, S1/S2 +, no murmurs, no gallops, no carotid bruit.  Pulmonary: Effort and breath sounds normal, no stridor, rhonchi, wheezes, rales.  Abdominal:  Soft. BS +,  no distension, tenderness, rebound or guarding.  Musculoskeletal: Normal range of motion. and no tenderness.  Neuro: Alert. CN 2-12 grossly intact. No focal deficits. Skin: Skin is warm and dry. No rash noted. Psychiatric: Normal mood and affect.      LABORATORY PANEL:   CBC Recent Labs  Lab 07/12/18 0434  WBC 10.5  HGB 8.8*  HCT 27.3*  PLT 409*   ------------------------------------------------------------------------------------------------------------------  Chemistries  Recent Labs  Lab 07/11/18 1019  07/13/18 0401  NA 134*   < > 133*  K 4.8   < > 4.1  CL 97*   < > 92*  CO2 27   < > 29  GLUCOSE 205*   < > 131*  BUN 41*   < > 56*  CREATININE 0.69   < > 0.95  CALCIUM 9.3   < > 8.8*  MG 3.2*  --   --   AST 50*  --   --   ALT 63*  --   --   ALKPHOS 106  --   --   BILITOT 0.9  --   --    < > = values in this interval not displayed.   ------------------------------------------------------------------------------------------------------------------  Cardiac Enzymes Recent Labs  Lab 07/11/18 1019  TROPONINI <0.03   ------------------------------------------------------------------------------------------------------------------  RADIOLOGY:  Dg Chest Port 1 View  Result Date: 07/11/2018 CLINICAL DATA:  Shortness of breath EXAM: PORTABLE CHEST 1 VIEW COMPARISON:  Jul 11, 2018 study obtained earlier in the day FINDINGS: Cardiomegaly is stable. Pacemaker leads are attached to the right atrium and right ventricle. There is atelectatic change in the mid lung regions bilaterally. There is no frank edema or consolidation. No adenopathy evident. No bone lesions. IMPRESSION: Stable cardiomegaly with pacemaker  lead positions unchanged. Atelectatic change in each mid lung. No edema or consolidation evident. Electronically Signed   By: Bretta Bang III M.D.   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  1.  Acute hypoxic respiratory failure in the setting of acute on chronic diastolic heart failure with preserved ejection fraction: Wean oxygen to room air as tolerated Increase Lasix 80 IV twice daily  monitor intake and output with daily weight.   CHF clinic upon discharge. Cardiology consultation appreciated. Duo nebs PRN  2.  Essential hypertension: Continue Coreg Norvasc discontinued due to lower extremity edema which Norvasc may be contributing to.   3.  Hyperlipidemia: Continue statin  4.  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  Anemia of chronic disease: Monitor hemoglobin  6.  Constipation: Stool softeners ordered along with enema and suppository.  PT for d/c plan  Management plans discussed with the patient and she is in agreement.  CODE STATUS: full  TOTAL TIME TAKING CARE OF THIS PATIENT: 33 minutes.     POSSIBLE D/C 1-2 days, DEPENDING ON CLINICAL CONDITION.   Adrian Saran M.D on 07/13/2018 at 11:42 AM  Between 7am to 6pm - Pager - [phone] After 6pm go to [email or web address] - password [email or web address]  [facility] [place] [facility]  Office  [phone]  CC: Primary care physician; Emi Belfast, FNP  Note: This dictation was prepared with Dragon dictation along with smaller phrase technology. Any transcriptional errors that result from this process are unintentional.

## 2018-07-13 NOTE — Progress Notes (Signed)
PT Cancellation Note  Patient Details Name: Tiffany Barber MRN: [id number] DOB: 05/14/1940   Cancelled Treatment:    Reason Eval/Treat Not Completed: Medical issues which prohibited therapy(Chart reviewed, RN reports patient this date has recently had worsening of SOB, respiratory status, and worsening anxiety. Will hold eval and attempt again at later date/time. )  10:45 AM, 07/13/18 Rosamaria Lints, PT, DPT Physical Therapist - [facility] [place]  [phone] (ASCOM)    Trampus Mcquerry C 07/13/2018, 10:45 AM

## 2018-07-14 ENCOUNTER — Inpatient Hospital Stay: Payer: Medicare Other

## 2018-07-14 ENCOUNTER — Other Ambulatory Visit: Payer: Medicare Other

## 2018-07-14 ENCOUNTER — Other Ambulatory Visit: Payer: Self-pay

## 2018-07-14 DIAGNOSIS — J9601 Acute respiratory failure with hypoxia: Secondary | ICD-10-CM

## 2018-07-14 DIAGNOSIS — G931 Anoxic brain damage, not elsewhere classified: Secondary | ICD-10-CM

## 2018-07-14 LAB — BASIC METABOLIC PANEL
Anion gap: 14 (ref 5–15)
BUN: 52 mg/dL — ABNORMAL HIGH (ref 8–23)
CO2: 30 mmol/L (ref 22–32)
Calcium: 8.8 mg/dL — ABNORMAL LOW (ref 8.9–10.3)
Chloride: 89 mmol/L — ABNORMAL LOW (ref 98–111)
Creatinine, Ser: 0.88 mg/dL (ref 0.44–1.00)
GFR calc Af Amer: >60 mL/min (ref >60)
GFR calc non Af Amer: >60 mL/min (ref >60)
Glucose, Bld: 208 mg/dL — ABNORMAL HIGH (ref 70–99)
Potassium: 3.1 mmol/L — ABNORMAL LOW (ref 3.5–5.1)
Sodium: 133 mmol/L — ABNORMAL LOW (ref 135–145)

## 2018-07-14 LAB — PROTIME-INR
INR: 1.4 — ABNORMAL HIGH (ref 0.8–1.2)
Prothrombin Time: 17.1 seconds — ABNORMAL HIGH (ref 11.4–15.2)

## 2018-07-14 LAB — CBC WITH DIFFERENTIAL/PLATELET
Abs Immature Granulocytes: 0.82 10*3/uL — ABNORMAL HIGH (ref 0.00–0.07)
Basophils Absolute: 0.1 10*3/uL (ref 0.0–0.1)
Basophils Relative: 0 %
Eosinophils Absolute: 0 10*3/uL (ref 0.0–0.5)
Eosinophils Relative: 0 %
HCT: 29.6 % — ABNORMAL LOW (ref 36.0–46.0)
Hemoglobin: 9.3 g/dL — ABNORMAL LOW (ref 12.0–15.0)
Immature Granulocytes: 4 %
Lymphocytes Relative: 2 %
Lymphs Abs: 0.4 10*3/uL — ABNORMAL LOW (ref 0.7–4.0)
MCH: 31.2 pg (ref 26.0–34.0)
MCHC: 31.4 g/dL (ref 30.0–36.0)
MCV: 99.3 fL (ref 80.0–100.0)
Monocytes Absolute: 0.8 10*3/uL (ref 0.1–1.0)
Monocytes Relative: 4 %
Neutro Abs: 17.2 10*3/uL — ABNORMAL HIGH (ref 1.7–7.7)
Neutrophils Relative %: 90 %
Platelets: 481 10*3/uL — ABNORMAL HIGH (ref 150–400)
RBC: 2.98 MIL/uL — ABNORMAL LOW (ref 3.87–5.11)
RDW: 13.9 % (ref 11.5–15.5)
WBC: 19.3 10*3/uL — ABNORMAL HIGH (ref 4.0–10.5)
nRBC: 0 % (ref 0.0–0.2)

## 2018-07-14 LAB — COMPREHENSIVE METABOLIC PANEL
ALT: 156 U/L — ABNORMAL HIGH (ref 0–44)
AST: 199 U/L — ABNORMAL HIGH (ref 15–41)
Albumin: 3.1 g/dL — ABNORMAL LOW (ref 3.5–5.0)
Alkaline Phosphatase: 151 U/L — ABNORMAL HIGH (ref 38–126)
Anion gap: 18 — ABNORMAL HIGH (ref 5–15)
BUN: 58 mg/dL — ABNORMAL HIGH (ref 8–23)
CO2: 25 mmol/L (ref 22–32)
Calcium: 9.1 mg/dL (ref 8.9–10.3)
Chloride: 88 mmol/L — ABNORMAL LOW (ref 98–111)
Creatinine, Ser: 1.21 mg/dL — ABNORMAL HIGH (ref 0.44–1.00)
GFR calc Af Amer: 49 mL/min — ABNORMAL LOW (ref >60)
GFR calc non Af Amer: 43 mL/min — ABNORMAL LOW (ref >60)
Glucose, Bld: 165 mg/dL — ABNORMAL HIGH (ref 70–99)
Potassium: 3.9 mmol/L (ref 3.5–5.1)
Sodium: 131 mmol/L — ABNORMAL LOW (ref 135–145)
Total Bilirubin: 1 mg/dL (ref 0.3–1.2)
Total Protein: 6.3 g/dL — ABNORMAL LOW (ref 6.5–8.1)

## 2018-07-14 LAB — BLOOD GAS, ARTERIAL
Acid-Base Excess: 1 mmol/L (ref 0.0–2.0)
Bicarbonate: 27.1 mmol/L (ref 20.0–28.0)
FIO2: 1
MECHVT: 500 mL
O2 Saturation: 99.8 %
PEEP: 10 cmH2O
Patient temperature: 37
RATE: 15 resp/min
pCO2 arterial: 48 mmHg (ref 32.0–48.0)
pH, Arterial: 7.36 (ref 7.350–7.450)
pO2, Arterial: 248 mmHg — ABNORMAL HIGH (ref 83.0–108.0)

## 2018-07-14 LAB — GLUCOSE, CAPILLARY
Glucose-Capillary: 145 mg/dL — ABNORMAL HIGH (ref 70–99)
Glucose-Capillary: 149 mg/dL — ABNORMAL HIGH (ref 70–99)

## 2018-07-14 LAB — MAGNESIUM: Magnesium: 2.8 mg/dL — ABNORMAL HIGH (ref 1.7–2.4)

## 2018-07-14 LAB — LACTIC ACID, PLASMA: Lactic Acid, Venous: 5.8 mmol/L (ref 0.5–1.9)

## 2018-07-14 LAB — APTT: aPTT: 40 seconds — ABNORMAL HIGH (ref 24–36)

## 2018-07-14 LAB — TROPONIN I
Troponin I: 0.19 ng/mL (ref <0.03)
Troponin I: 0.42 ng/mL (ref <0.03)

## 2018-07-14 LAB — BRAIN NATRIURETIC PEPTIDE: B Natriuretic Peptide: 250 pg/mL — ABNORMAL HIGH (ref 0.0–100.0)

## 2018-07-14 LAB — PHOSPHORUS: Phosphorus: 6.9 mg/dL — ABNORMAL HIGH (ref 2.5–4.6)

## 2018-07-14 LAB — PROCALCITONIN: Procalcitonin: 0.12 ng/mL

## 2018-07-14 MED ORDER — LEVETIRACETAM IN NACL 500 MG/100ML IV SOLN
500.0000 mg | Freq: Two times a day (BID) | INTRAVENOUS | Status: DC
  Administered 2018-07-14 – 2018-07-17 (×6): 500 mg via INTRAVENOUS
  Filled 2018-07-14 (×8): qty 100

## 2018-07-14 MED ORDER — NOREPINEPHRINE 4 MG/250ML-% IV SOLN
0.0000 ug/min | INTRAVENOUS | Status: DC
  Administered 2018-07-15: 03:00:00 4 ug/min via INTRAVENOUS
  Administered 2018-07-15: 2 ug/min via INTRAVENOUS
  Filled 2018-07-14: qty 250

## 2018-07-14 MED ORDER — VALPROATE SODIUM 500 MG/5ML IV SOLN
250.0000 mg | Freq: Three times a day (TID) | INTRAVENOUS | Status: DC
  Administered 2018-07-14 – 2018-07-15 (×2): 250 mg via INTRAVENOUS
  Filled 2018-07-14 (×4): qty 2.5

## 2018-07-14 MED ORDER — PRAVASTATIN SODIUM 20 MG PO TABS
20.0000 mg | ORAL_TABLET | Freq: Every day | ORAL | Status: DC
  Administered 2018-07-14 – 2018-07-20 (×6): 20 mg
  Filled 2018-07-14 (×6): qty 1

## 2018-07-14 MED ORDER — MIDAZOLAM HCL 2 MG/2ML IJ SOLN
2.0000 mg | Freq: Once | INTRAMUSCULAR | Status: AC
  Administered 2018-07-14: 2 mg via INTRAVENOUS

## 2018-07-14 MED ORDER — FENTANYL 2500MCG IN NS 250ML (10MCG/ML) PREMIX INFUSION: 0.0000 ug/h | INTRAVENOUS | Status: DC

## 2018-07-14 MED ORDER — DOCUSATE SODIUM 50 MG/5ML PO LIQD
100.0000 mg | Freq: Two times a day (BID) | ORAL | Status: DC
  Administered 2018-07-14 – 2018-07-19 (×10): 100 mg
  Filled 2018-07-14 (×9): qty 10

## 2018-07-14 MED ORDER — SODIUM CHLORIDE 0.9 % IV SOLN
INTRAVENOUS | Status: DC
  Administered 2018-07-14: 18:00:00 via INTRAVENOUS

## 2018-07-14 MED ORDER — MIDAZOLAM HCL 2 MG/2ML IJ SOLN
1.0000 mg | INTRAMUSCULAR | Status: AC | PRN
  Administered 2018-07-14 – 2018-07-15 (×3): 1 mg via INTRAVENOUS
  Filled 2018-07-14 (×3): qty 2

## 2018-07-14 MED ORDER — PROPOFOL 1000 MG/100ML IV EMUL
INTRAVENOUS | Status: AC
  Administered 2018-07-14: 14:00:00 10 ug/kg/min via INTRAVENOUS
  Filled 2018-07-14: qty 100

## 2018-07-14 MED ORDER — POTASSIUM CHLORIDE CRYS ER 20 MEQ PO TBCR
40.0000 meq | EXTENDED_RELEASE_TABLET | Freq: Two times a day (BID) | ORAL | Status: DC
  Administered 2018-07-14: 40 meq via ORAL
  Filled 2018-07-14: qty 2

## 2018-07-14 MED ORDER — FENTANYL CITRATE (PF) 100 MCG/2ML IJ SOLN: 25.0000 ug | INTRAMUSCULAR | Status: DC | PRN

## 2018-07-14 MED ORDER — BISACODYL 10 MG RE SUPP
10.0000 mg | Freq: Every day | RECTAL | Status: DC | PRN
  Filled 2018-07-14: qty 1

## 2018-07-14 MED ORDER — MIDAZOLAM HCL 2 MG/2ML IJ SOLN
INTRAMUSCULAR | Status: AC
  Filled 2018-07-14: qty 2

## 2018-07-14 MED ORDER — PROPOFOL 1000 MG/100ML IV EMUL
0.0000 ug/kg/min | INTRAVENOUS | Status: DC
  Administered 2018-07-14: 14:00:00 10 ug/kg/min via INTRAVENOUS
  Administered 2018-07-15 – 2018-07-16 (×4): 20 ug/kg/min via INTRAVENOUS
  Filled 2018-07-14 (×4): qty 100

## 2018-07-14 MED ORDER — ASPIRIN 81 MG PO CHEW
81.0000 mg | CHEWABLE_TABLET | Freq: Every day | ORAL | Status: DC
  Administered 2018-07-15 – 2018-07-20 (×5): 81 mg
  Filled 2018-07-14 (×5): qty 1

## 2018-07-14 MED ORDER — SODIUM CHLORIDE 0.9 % IV SOLN
1.0000 g | Freq: Every day | INTRAVENOUS | Status: DC
  Administered 2018-07-14 – 2018-07-16 (×3): 1 g via INTRAVENOUS
  Filled 2018-07-14: qty 10
  Filled 2018-07-14 (×3): qty 1

## 2018-07-14 MED ORDER — MIDAZOLAM HCL 2 MG/2ML IJ SOLN
1.0000 mg | INTRAMUSCULAR | Status: DC | PRN
  Administered 2018-07-15 – 2018-07-19 (×5): 1 mg via INTRAVENOUS
  Filled 2018-07-14 (×4): qty 2

## 2018-07-14 MED ORDER — FAMOTIDINE IN NACL 20-0.9 MG/50ML-% IV SOLN
20.0000 mg | INTRAVENOUS | Status: DC
  Administered 2018-07-14 – 2018-07-16 (×3): 20 mg via INTRAVENOUS
  Filled 2018-07-14 (×3): qty 50

## 2018-07-14 MED ORDER — ORAL CARE MOUTH RINSE
15.0000 mL | OROMUCOSAL | Status: DC
  Administered 2018-07-14 – 2018-07-20 (×57): 15 mL via OROMUCOSAL

## 2018-07-14 MED ORDER — VECURONIUM BROMIDE 10 MG IV SOLR
10.0000 mg | Freq: Once | INTRAVENOUS | Status: AC
  Administered 2018-07-14: 10 mg via INTRAVENOUS

## 2018-07-14 MED ORDER — CHLORHEXIDINE GLUCONATE 0.12% ORAL RINSE (MEDLINE KIT)
15.0000 mL | Freq: Two times a day (BID) | OROMUCOSAL | Status: DC
  Administered 2018-07-14 – 2018-07-20 (×12): 15 mL via OROMUCOSAL

## 2018-07-14 NOTE — Care Management Important Message (Signed)
Important Message  Patient Details  Name: Tiffany Barber MRN: [id number] Date of Birth: 1941-02-14   Medicare Important Message Given:  Yes    [name] 07/14/2018, 12:37 PM

## 2018-07-14 NOTE — Procedures (Signed)
Central Venous Catheter Insertion Procedure Note Tiffany Barber [id number] Jan 09, 1941  Procedure: Insertion of Central Venous Catheter Indications: Assessment of intravascular volume, Drug and/or fluid administration and Frequent blood sampling  Procedure Details Consent: Unable to obtain consent because of altered level of consciousness. Time Out: Verified patient identification, verified procedure, site/side was marked, verified correct patient position, special equipment/implants available, medications/allergies/relevent history reviewed, required imaging and test results available.  Performed  Maximum sterile technique was used including antiseptics, cap, gloves, gown, hand hygiene, mask and sheet. Skin prep: Chlorhexidine; local anesthetic administered A antimicrobial bonded/coated triple lumen catheter was placed in the left femoral vein due to emergent situation using the Seldinger technique.  Evaluation Blood flow good Complications: No apparent complications Patient did tolerate procedure well. Chest X-ray ordered to verify placement.  CXR: Not applicable, placed in left femoral vein.   Procedure was performed using ultrasound for direct visualization of cannulization of left femoral vein.    Tiffany Barber, Tiffany Barber [facility] Pager: [phone] Cell: [phone]  Tiffany Barber 07/14/2018, 11:36 PM

## 2018-07-14 NOTE — Progress Notes (Addendum)
CODE BLUE called on this patient who underwent PEA arrest. ER physician intubated patient.  Patient received several rounds of epinephrine and several minutes of chest compressions.  Pulses palpable.  Patient to be transferred to ICU bed 8.  Plan of care discussed with intensivist as well as the patient's son.  CRITICAL CARE Time 33 minutes

## 2018-07-14 NOTE — Progress Notes (Signed)
Patient Name: Tiffany Barber Date of Encounter: 07/14/2018  Hospital Problem List     Active Problems:   Acute diastolic CHF (congestive heart failure) [facility])    Patient Profile     78 year old female admitted with second-degree heart block with symptomatic bradycardia.  Had permanent pacemaker placed.  Was discharged home return with shortness of breath.  Was being treated with diuresis although today was found to be unresponsive and felt to be in PEA.  She was transferred to the ICU.  She is currently unresponsive.  She is getting code ice.  She is AV pacing.  Subjective   Intubated and unresponsive  Inpatient Medications    . aspirin  81 mg Per Tube Daily  . docusate  100 mg Per Tube BID  . enoxaparin (LOVENOX) injection  40 mg Subcutaneous Q12H  . furosemide  80 mg Intravenous Q12H  . ipratropium-albuterol  3 mL Nebulization TID  . pravastatin  20 mg Per Tube q1800  . sodium chloride flush  3 mL Intravenous Q12H    Vital Signs    Vitals:   07/14/18 1400 07/14/18 1430 07/14/18 1500 07/14/18 1556  BP: 120/70 (!) 95/57  110/71  Pulse: 64 (!) 37  60  Resp: Temp: (!) 97.5 F (36.4 C)     TempSrc: Oral     SpO2: 100% 100% 100% 100%  Weight:      Height:        Intake/Output Summary (Last 24 hours) at 07/14/2018 1651 Last data filed at 07/14/2018 0344 Gross per 24 hour  Intake -  Output 650 ml  Net -650 ml   Filed Weights   07/12/18 0403 07/13/18 0421 07/14/18 0342  Weight: 96.3 kg 97.2 kg 97 kg    Physical Exam    GEN: Well nourished, well developed, in no acute distress.  HEENT: normal.  Neck: Supple, no JVD, carotid bruits, or masses. Cardiac: RRR, no murmurs, rubs, or gallops. No clubbing, cyanosis, edema.  Radials/DP/PT 2+ and equal bilaterally.  Respiratory:  Respirations regular and unlabored, clear to auscultation bilaterally. GI: Soft, nontender, nondistended, BS + x 4. MS: no deformity or atrophy. Skin: warm and dry, no rash. Neuro:   Strength and sensation are intact. Psych: Normal affect.  Labs    CBC Recent Labs    07/12/18 0434 07/14/18 1506  WBC 10.5 19.3*  NEUTROABS  --  17.2*  HGB 8.8* 9.3*  HCT 27.3* 29.6*  MCV 97.5 99.3  PLT 409* 481*   Basic Metabolic Panel Recent Labs    09/81/19 0501 07/14/18 1506  NA 133* 131*  K 3.1* 3.9  CL 89* 88*  CO2 30 25  GLUCOSE 208* 165*  BUN 52* 58*  CREATININE 0.88 1.21*  CALCIUM 8.8* 9.1  MG  --  2.8*  PHOS  --  6.9*   Liver Function Tests Recent Labs    07/14/18 1506  AST 199*  ALT 156*  ALKPHOS 151*  BILITOT 1.0  PROT 6.3*  ALBUMIN 3.1*   No results for input(s): LIPASE, AMYLASE in the last 72 hours. Cardiac Enzymes Recent Labs    07/14/18 1506  TROPONINI 0.19*   BNP Recent Labs    07/14/18 1506  BNP 250.0*   D-Dimer No results for input(s): DDIMER in the last 72 hours. Hemoglobin A1C No results for input(s): HGBA1C in the last 72 hours. Fasting Lipid Panel No results for input(s): CHOL, HDL, LDLCALC, TRIG, CHOLHDL, LDLDIRECT in the last 72 hours.  Thyroid Function Tests No results for input(s): TSH, T4TOTAL, T3FREE, THYROIDAB in the last 72 hours.  Invalid input(s): FREET3  Telemetry    Atrial ventricular pacing  ECG    AV pacing  Radiology    Dg Chest 1 View  Result Date: 07/14/2018 CLINICAL DATA:  Status post intubation and OG tube placement. EXAM: CHEST  1 VIEW COMPARISON:  Single-view of the chest 07/11/2018. FINDINGS: Endotracheal tube is in place with the tip in good position just below the clavicular heads. OG tube tip is in the fundus of the stomach. The patient has small to moderate right pleural effusion and basilar airspace disease. Discoid atelectasis left mid lung and atelectasis in the left lung base noted. No pneumothorax. There is cardiomegaly. IMPRESSION: ET tube and OG tube in good position. Small to moderate right pleural effusion and basilar airspace disease have increased since the prior exam. No change  in left basilar atelectasis. Electronically Signed   By: Drusilla Kanner M.D.   On: 07/14/2018 13:54   Dg Abd 1 View  Result Date: 07/14/2018 CLINICAL DATA:  Enteric tube placement. EXAM: ABDOMEN - 1 VIEW COMPARISON:  Abdominal x-ray dated Jun 30, 2010. FINDINGS: Enteric tube in the stomach with the tip in the gastric fundus. Small right pleural effusion. The bowel gas pattern is normal. No radio-opaque calculi or other significant radiographic abnormality are seen. IMPRESSION: 1. Enteric tube in the stomach. 2. Small right pleural effusion. Electronically Signed   By: Obie Dredge M.D.   On: 07/14/2018 13:53   Ct Head Wo Contrast  Result Date: 07/14/2018 CLINICAL DATA:  Status post resuscitation. EXAM: CT HEAD WITHOUT CONTRAST TECHNIQUE: Contiguous axial images were obtained from the base of the skull through the vertex without intravenous contrast. COMPARISON:  09/28/2017 FINDINGS: Examination is quite limited due to patient motion despite rescanning attempts. Brain: The ventricles are in the midline without mass effect or shift. No extra-axial fluid collections are identified. Stable periventricular white matter disease and remote left sided basal ganglia infarct. No findings for acute hemispheric infarction or intracranial hemorrhage. No mass lesions. The brainstem and cerebellum are grossly normal. Vascular: Stable vascular calcifications. No definite aneurysm or hyperdense vessels. Skull: No skull fracture or bone lesion. Mild hyperostosis frontalis interna. Sinuses/Orbits: The paranasal sinuses and mastoid air cells are grossly clear. The globes are intact. Other: No scalp hematoma or mass. IMPRESSION: 1. Limited examination. 2. No definite CT findings for acute intracranial process or skull fracture. 3. 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 Electronically Signed   By: Rudie Meyer M.D.   On: 07/14/2018 14:28   Dg Chest Port 1  View  Result Date: 07/11/2018 CLINICAL DATA:  Shortness of breath EXAM: PORTABLE CHEST 1 VIEW COMPARISON:  Jul 11, 2018 study obtained earlier in the day FINDINGS: Cardiomegaly is stable. Pacemaker leads are attached to the right atrium and right ventricle. There is atelectatic change in the mid lung regions bilaterally. There is no frank edema or consolidation. No adenopathy evident. No bone lesions. IMPRESSION: Stable cardiomegaly with pacemaker lead positions unchanged. Atelectatic change in each mid lung. No edema or consolidation evident. Electronically Signed   By: Bretta Bang III M.D.   On: 07/11/2018 23:49   Dg Chest Portable 1 View  Result Date: 07/11/2018 CLINICAL DATA:  Shortness of breath EXAM: PORTABLE CHEST 1 VIEW COMPARISON:  06/29/2018, 06/30/2018 FINDINGS: There is mild bilateral interstitial prominence. There are low lung volumes likely partially accounting for the  interstitial prominence. There is no focal consolidation, pleural effusion or pneumothorax. There is stable cardiomegaly. There is a dual lead cardiac pacemaker. The osseous structures are unremarkable. IMPRESSION: 1. Cardiomegaly with mild pulmonary vascular congestion. Interstitial markings are accentuated by low lung volumes. Electronically Signed   By: Elige KoHetal  Patel   On: 07/11/2018 11:01   Dg Chest Port 1 View  Result Date: 06/30/2018 CLINICAL DATA:  Postop pacemaker insertion. EXAM: PORTABLE CHEST 1 VIEW COMPARISON:  06/29/2018 FINDINGS: The patient is status post placement of a dual chamber left-sided pacemaker. Of or is no evidence of a pneumothorax. The cardiac silhouette remains enlarged. There are prominent interstitial lung markings bilaterally consistent with pulmonary edema. The lung volumes are low. Bibasilar atelectasis is noted. There is no acute osseous abnormality. IMPRESSION: 1. Status post placement of a dual chamber left-sided pacemaker with no evidence of a pneumothorax. 2. Cardiomegaly with findings  of pulmonary edema. Electronically Signed   By: Katherine Mantlehristopher  Green M.D.   On: 06/30/2018 14:49   Dg Chest Portable 1 View  Result Date: 06/29/2018 CLINICAL DATA:  Cough.  Low oxygen saturation. EXAM: PORTABLE CHEST 1 VIEW COMPARISON:  09/25/2017 FINDINGS: Heart is enlarged. The aorta is tortuous. There is newly seen widespread bilateral pulmonary density. The pattern is nonspecific and could represent pneumonia or heart failure. Possible small amount of pleural effusion on each side. No significant bone finding. IMPRESSION: Cardiomegaly. Widespread abnormal lung density. The differential diagnosis is heart failure versus pneumonia. Electronically Signed   By: Paulina FusiMark  Shogry M.D.   On: 06/29/2018 17:03   Dg C-arm 1-60 Min-no Report  Result Date: 06/30/2018 Fluoroscopy was utilized by the requesting physician.  No radiographic interpretation.    Assessment & Plan    Patient with history of second-degree heart block and diastolic heart failure who was admitted for diuresis.  Became unresponsive and felt to have PEA.  She is currently pacing AV sequentially.  Review of chest x-ray does not show any change in her leads.  We will continue to treat with code ice.  We will continue to follow with you.  Signed, Darlin PriestlyKenneth A. Cambria Osten MD 07/14/2018, 4:51 PM  Pager: [phone]

## 2018-07-14 NOTE — ED Provider Notes (Signed)
[facility]  Department of Emergency Medicine   Code Blue CONSULT NOTE  Chief Complaint: Cardiac arrest/unresponsive   Level V Caveat: Unresponsive  History of present illness: I was contacted by the hospital for a CODE BLUE cardiac arrest upstairs and presented to the patient's bedside.  Patient with a history of heart failure, pacemaker, admitted for acute respiratory failure.  CODE BLUE called due to patient being found unresponsive and pulseless.  ROS: Unable to obtain, Level V caveat  Scheduled Meds: . aspirin EC  81 mg Oral BID  . benzonatate  200 mg Oral TID  . bisacodyl  10 mg Rectal Daily  . carvedilol  6.25 mg Oral BID WC  . docusate sodium  100 mg Oral BID  . enoxaparin (LOVENOX) injection  40 mg Subcutaneous Q12H  . furosemide  80 mg Intravenous Q12H  . ipratropium-albuterol  3 mL Nebulization TID  . potassium chloride  40 mEq Oral BID  . pravastatin  20 mg Oral q1800  . senna  1 tablet Oral Daily  . sodium chloride flush  3 mL Intravenous Q12H   Continuous Infusions: . sodium chloride     PRN Meds:.sodium chloride, acetaminophen **OR** acetaminophen, albuterol, bisacodyl, guaiFENesin-dextromethorphan, HYDROcodone-acetaminophen, LORazepam, ondansetron **OR** ondansetron (ZOFRAN) IV, senna-docusate, sodium chloride flush Past Medical History:  Diagnosis Date  . 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 Hyperlipidemia   . Hypertension   . LVH (left ventricular hypertrophy)   . Stroke [facility](HCC)    Past Surgical History:  Procedure Laterality Date  . JOINT REPLACEMENT Left 2019   left knee  . PACEMAKER INSERTION N/A 06/30/2018   Procedure: INSERTION PACEMAKER;  Surgeon: Marcina MillardParaschos, Alexander, MD;  Location: [facility] ORS;  Service: Cardiovascular;  Laterality: N/A;  . REPLACEMENT TOTAL KNEE BILATERAL     Social History   Socioeconomic History  . Marital status: Widowed    Spouse name: Not on file  . Number of children: Not on file  .  Years of education: Not on file  . Highest education level: Not on file  Occupational History  . Not on file  Social Needs  . Financial resource strain: Not on file  . Food insecurity:    Worry: Not on file    Inability: Not on file  . Transportation needs:    Medical: Not on file    Non-medical: Not on file  Tobacco Use  . Smoking status: Never Smoker  . Smokeless tobacco: Never Used  Substance and Sexual Activity  . Alcohol use: No  . Drug use: No  . Sexual activity: Not on file  Lifestyle  . Physical activity:    Days per week: Not on file    Minutes per session: Not on file  . Stress: Not on file  Relationships  . Social connections:    Talks on phone: Not on file    Gets together: Not on file    Attends religious service: Not on file    Active member of club or organization: Not on file    Attends meetings of clubs or organizations: Not on file    Relationship status: Not on file  . Intimate partner violence:    Fear of current or ex partner: Not on file    Emotionally abused: Not on file    Physically abused: Not on file    Forced sexual activity: Not on file  Other Topics Concern  . Not on file  Social History Narrative  .  Not on file   Allergies  Allergen Reactions  . Ace Inhibitors Other (See Comments)    Other reaction(s): angioedema resulting in intubation Other reaction(s): angioedema resulting in intubation   . Nifedipine Swelling and Shortness Of Breath  . Lisinopril     Last set of Vital Signs (not current) Vitals:   07/14/18 0806 07/14/18 0811  BP:  96/70  Pulse: 88 89  Resp: 18 18  Temp:  97.8 F (36.6 C)  SpO2: 91% 91%      Physical Exam  Gen: unresponsive.  Obese Cardiovascular: pulseless  Resp: apneic. Breath sounds equal bilaterally with bagging  Abd: nondistended  Neuro: GCS 3, unresponsive to pain  HEENT: No blood in posterior pharynx, gag reflex absent  Neck: No crepitus  Musculoskeletal: No deformity  Skin:  warm  Procedures  INTUBATION Performed by: Sharman Cheek Required items: required blood products, implants, devices, and special equipment available Patient identity confirmed: provided demographic data and hospital-assigned identification number Time out: Immediately prior to procedure a "time out" was called to verify the correct patient, procedure, equipment, support staff and site/side marked as required. Indications: Cardiac arrest, respiratory failure Intubation method: glidescope, orotracheal Preoxygenation: BVM Sedatives: none Paralytic: none Tube Size: 7.5 cuffed, 21 at lip Post-procedure assessment: chest rise and ETCO2 monitor Breath sounds: equal and absent over the epigastrium Tube secured by Respiratory Therapy Patient tolerated the procedure well with no immediate complications.    Cardiopulmonary Resuscitation (CPR) Procedure Note  Directed/Performed by: Sharman Cheek I personally directed ancillary staff and/or performed CPR in an effort to regain return of spontaneous circulation and to maintain cardiac, neuro and systemic perfusion.  This included staff feedback and instruction for maximally effective CPR, multiple rounds of epinephrine   Medical Decision making  Cardiac arrest / PEA rhythm   Assessment and Plan  Intubated by me, able to achieve spontaneous circulation about 5 minutes afterward with continued CPR and epinephrine.  Care transferred to hospitalist who arrived at the bedside.    Sharman Cheek, MD 07/14/18 1312

## 2018-07-14 NOTE — Procedures (Signed)
Tiffany A. Merlene Laughter, MD     [email or web address]           HISTORY: The patient is 78 year old female who presents with altered mental status and unresponsiveness status post cardiac arrest.  The study is carried out to evaluate for neurological function and seizures.  MEDICATIONS:  Current Facility-Administered Medications:  .  0.9 %  sodium chloride infusion, 250 mL, Intravenous, PRN, Tiffany Loll, MD .  0.9 %  sodium chloride infusion, , Intravenous, Continuous, Tiffany Bill, NP, Last Rate: 50 mL/hr at 07/14/18 1804 .  acetaminophen (TYLENOL) tablet 650 mg, 650 mg, Oral, Q6H PRN **OR** acetaminophen (TYLENOL) suppository 650 mg, 650 mg, Rectal, Q6H PRN, Tiffany Loll, MD .  albuterol (PROVENTIL) (2.5 MG/3ML) 0.083% nebulizer solution 2.5 mg, 2.5 mg, Nebulization, Q2H PRN, Tiffany Loll, MD, 2.5 mg at 07/14/18 0444 .  aspirin chewable tablet 81 mg, 81 mg, Per Tube, Daily, Tiffany Barber, Tiffany Saa, NP .  bisacodyl (DULCOLAX) suppository 10 mg, 10 mg, Rectal, Daily PRN, Tiffany Bill, NP .  cefTRIAXone (ROCEPHIN) 1 g in sodium chloride 0.9 % 100 mL IVPB, 1 g, Intravenous, q1800, Tiffany Bill, NP, Stopped at 07/14/18 1750 .  chlorhexidine gluconate (MEDLINE KIT) (PERIDEX) 0.12 % solution 15 mL, 15 mL, Mouth Rinse, BID, Barber, Fuad, MD .  docusate (COLACE) 50 MG/5ML liquid 100 mg, 100 mg, Per Tube, BID, Tiffany Barber, Tiffany G, NP .  enoxaparin (LOVENOX) injection 40 mg, 40 mg, Subcutaneous, Q12H, Barber, Sital, MD, 40 mg at 07/14/18 1031 .  famotidine (PEPCID) IVPB 20 mg premix, 20 mg, Intravenous, Q24H, Tiffany Barber, Tiffany G, NP .  fentaNYL 2530mg in NS 2548m(1049mml) infusion-PREMIX, 0-400 mcg/hr, Intravenous, Continuous, Tiffany Barber, Tiffany G, NP .  furosemide (LASIX) injection 80 mg, 80 mg, Intravenous, Q12H, Barber, Sital, MD, 80 mg at 07/14/18 1744 .  ipratropium-albuterol (DUONEB) 0.5-2.5 (3) MG/3ML nebulizer solution 3 mL, 3 mL, Nebulization, TID, Barber, Sital, MD, 3 mL at 07/14/18  1525 .  MEDLINE mouth rinse, 15 mL, Mouth Rinse, 10 times per day, Tiffany GlazierD .  norepinephrine (LEVOPHED) 4mg27m 250mL50mmix infusion, 0-40 mcg/min, Intravenous, Titrated, Tiffany Barber, Tiffany G, NP .  ondansetron (ZOFRAN) tablet 4 mg, 4 mg, Oral, Q6H PRN **OR** ondansetron (ZOFRAN) injection 4 mg, 4 mg, Intravenous, Q6H PRN, Tiffany,Tiffany Barber.  pravastatin (PRAVACHOL) tablet 20 mg, 20 mg, Per Tube, q1800, BlakeAwilda Barber 20 mg at 07/14/18 1817 .  propofol (DIPRIVAN) 1000 MG/100ML infusion, 0-50 mcg/kg/min, Intravenous, Continuous, Tiffany Barber, Tiffany Tiffany Saa Last Rate: 2.91 mL/hr at 07/14/18 1804, 5 mcg/kg/min at 07/14/18 1804 .  sodium chloride flush (NS) 0.9 % injection 3 mL, 3 mL, Intravenous, Q12H, Tiffany,Tiffany Barber 3 mL at 07/14/18 1500 .  sodium chloride flush (NS) 0.9 % injection 3 mL, 3 mL, Intravenous, PRN, Tiffany,Tiffany Barber    ANALYSIS: A 16 channel recording using standard 10 20 measurements is conducted for 22 minutes.  There is a low voltage activity that gets as high as 11 Hz seen throughout the recording.  There is also beta activity observed in the frontal areas.  There is frequent generalized delta slowing at the rate of approximately 1 Hz with the maximal activity of the occipital areas bilaterally.   Photic stimulation and hyperventilation are not conducted.  No clear epileptiform activity is observed.  Myogenic changes are noted but there is overall poor reactivity to painful stimuli.   IMPRESSION: 1.  The background activity is mostly normal but frequently interrupted  with generalized delta slowing especially in the occipital areas.  No epileptiform discharges are observed.      Tiffany Barber A. Merlene Barber, M.D.  Diplomate, Tax adviser of Psychiatry and Neurology ( Neurology).

## 2018-07-14 NOTE — Consult Note (Signed)
PHARMACY CONSULT NOTE - FOLLOW UP  Pharmacy Consult for Electrolyte Monitoring and Replacement   Recent Labs: Potassium (mmol/L)  Date Value  07/14/2018 3.1 (L)  07/07/2013 3.5   Magnesium (mg/dL)  Date Value  40/98/1191 3.2 (H)   Calcium (mg/dL)  Date Value  47/82/9562 8.8 (L)   Calcium, Total (mg/dL)  Date Value  13/09/6576 9.7   Albumin (g/dL)  Date Value  46/96/2952 3.8  07/06/2013 4.0   Sodium (mmol/L)  Date Value  07/14/2018 133 (L)  11/15/2014 139  07/06/2013 138     Assessment: Patient furosemide dose increased 5/27 with subsequent drop in potassium from 4.1-3.1  Goal of Therapy:  Electrolytes wnl's  Plan:  Will give KCl po x 2 and recheck potassium/magnesium with am labs  Albina Billet, PharmD, BCPS Clinical Pharmacist 07/14/2018 8:29 AM

## 2018-07-14 NOTE — Progress Notes (Signed)
Updated pts son Brixtyn Heaston regarding plan of care and all questions were answered.  Will continue to monitor and assess pt.  Sonda Rumble, AGNP  Pulmonary/Critical Care Pager [phone] (please enter 7 digits) PCCM Consult Pager [phone] (please enter 7 digits)

## 2018-07-14 NOTE — Evaluation (Signed)
Physical Therapy Evaluation Patient Details Name: Rainey PinesViolet Hawks MRN: [id number] DOB: 08/28/1940 Today's Date: 07/14/2018   History of Present Illness  Pt is a 78 y.o. female presenting to hospital 07/11/18 with SOB, shoulder pain, and weakness.  Pt with recent admission for pacemaker (d/t new onset heart block).  Pt admitted with acute respiratory failure with hypoxia d/t acute diastolic CHF.  PMH includes pacemaker, htn, LVH, stroke, acute diastolic CHF, B TKR.  Clinical Impression  Prior to hospital admission, pt reports needing assist with transfers (using RW) and has not been able to walk lately (since knee replacement last year); family assists pt; pt lives with her son in 1 level home with ramp to enter per pt report.  Pt oriented to self and place only.  Currently pt is 1 assist semi-supine to sit and 2 assist with transfers bed to recliner with RW.  Pt initially c/o R foot pain (top of foot) with standing (and sitting down d/t this pain) but pt requesting to get to chair; pt eventually able to stand and take steps bed to recliner with walker (although pt sitting early even when cued not to requiring increased assist for safety).  Dr. Juliene PinaMody arrived during session and notified of pt's report of R foot pain (pt denying any recent falls and did not appear antalgic taking steps).  Pt demonstrating generalized weakness, fatigue, and increased SOB/work of breathing with activity (O2 sats 92% or greater on 2 L O2 via nasal cannula during session).  Pt would benefit from skilled PT to address noted impairments and functional limitations (see below for any additional details).  Upon hospital discharge, pt would benefit from STR to improve strength, activity tolerance, and increase independence with functional mobility (if pt chooses to discharge home instead, pt would require 24/7 assist and HHPT).    Follow Up Recommendations SNF    Equipment Recommendations  Rolling walker with 5" wheels;3in1  (PT);Wheelchair (measurements PT);Wheelchair cushion (measurements PT)    Recommendations for Other Services OT consult     Precautions / Restrictions Precautions Precautions: Fall Precaution Comments: Pacemaker Restrictions Weight Bearing Restrictions: No      Mobility  Bed Mobility Overal bed mobility: Needs Assistance Bed Mobility: Supine to Sit     Supine to sit: HOB elevated     General bed mobility comments: nursing tech assisting pt with trunk and B LE's performing semi-supine to sit upon PT entering room (increased effort/time for pt to assist)  Transfers Overall transfer level: Needs assistance Equipment used: Rolling walker (2 wheeled) Transfers: Sit to/from Stand Sit to Stand: +2 physical assistance;+2 safety/equipment         General transfer comment: 1st trial pt unable to stand with 2 assist and RW use; pt then stood 2 more trials with min to mod assist x2 up to RW but pt c/o R foot pain (top of foot) both attempts standing and sitting back down on edge of bed (Dr. Juliene PinaMody came in and notified/checked out pt's foot); pt then stood again with min assist x2 up to RW  Ambulation/Gait Ambulation/Gait assistance: +2 physical assistance;+2 safety/equipment Gait Distance (Feet): 2 Feet Assistive device: Rolling walker (2 wheeled)   Gait velocity: decreased   General Gait Details: pt able to take a few steps bed to recliner with RW and min assist x2 initially (decreased B LE step length/foot clearance/heelstrike with increased effort/time to perform and increased SOB/work of breathing) but then pt sitting early (even with vc's to stay standing) requiring 2 assist to  position pt to sit in chair (and not on armrest)--pt stood again with min assist x2 to reposition self sitting in chair (attempted to have pt scoot over in chair but pt refused and requested to stand to reposition instead)  Stairs            Wheelchair Mobility    Modified Rankin (Stroke Patients  Only)       Balance Overall balance assessment: Needs assistance Sitting-balance support: Bilateral upper extremity supported;Feet supported Sitting balance-Leahy Scale: Fair Sitting balance - Comments: steady static sitting   Standing balance support: Bilateral upper extremity supported Standing balance-Leahy Scale: Fair Standing balance comment: pt requiring B UE support on RW for static standing balance                             Pertinent Vitals/Pain Pain Assessment: Faces Faces Pain Scale: Hurts a little bit Pain Location: L knee (chronic) and R foot Pain Descriptors / Indicators: Sore Pain Intervention(s): Limited activity within patient's tolerance;Monitored during session;Repositioned  HR WFL during session's activities.    Home Living Family/patient expects to be discharged to:: Private residence Living Arrangements: Children(Pt's son) Available Help at Discharge: Family Type of Home: House Home Access: Ramped entrance     Home Layout: One level Home Equipment: Environmental consultant - 2 wheels Additional Comments: Attempted to get bathroom information and DME information but pt did not answer therapists questions.    Prior Function Level of Independence: Needs assistance   Gait / Transfers Assistance Needed: Pt reporting unable to walk since L knee replacement last year but able to transfer with RW and 1 assist.     Comments: Per chart review and pt, pt's son and daughter assist pt.  No home O2.     Hand Dominance        Extremity/Trunk Assessment   Upper Extremity Assessment Upper Extremity Assessment: (pt unable to perform AROM B shoulder flexion (PROM grossly at least 70 degrees))    Lower Extremity Assessment Lower Extremity Assessment: Generalized weakness    Cervical / Trunk Assessment Cervical / Trunk Assessment: (forward head/trunk)  Communication   Communication: No difficulties  Cognition Arousal/Alertness: Awake/alert Behavior During  Therapy: WFL for tasks assessed/performed                                   General Comments: Pt with soft voice and difficult to understand at times.  Oriented to person and place.  Pt reported it was January or March of 1921.      General Comments   Nursing cleared pt for participation in physical therapy.  Pt agreeable to PT session.  NT present for all mobility during session.    Exercises  Transfer training   Assessment/Plan    PT Assessment Patient needs continued PT services  PT Problem List Decreased strength;Decreased range of motion;Decreased activity tolerance;Decreased balance;Decreased mobility;Decreased cognition;Decreased knowledge of use of DME;Decreased knowledge of precautions;Decreased safety awareness;Cardiopulmonary status limiting activity;Pain       PT Treatment Interventions DME instruction;Gait training;Functional mobility training;Therapeutic activities;Therapeutic exercise;Balance training;Patient/family education    PT Goals (Current goals can be found in the Care Plan section)  Acute Rehab PT Goals Patient Stated Goal: to improve strength and mobility PT Goal Formulation: With patient Time For Goal Achievement: 07/28/18 Potential to Achieve Goals: Fair    Frequency Min 2X/week   Barriers to discharge Decreased  caregiver support      Co-evaluation               AM-PAC PT "6 Clicks" Mobility  Outcome Measure Help needed turning from your back to your side while in a flat bed without using bedrails?: A Little Help needed moving from lying on your back to sitting on the side of a flat bed without using bedrails?: A Lot Help needed moving to and from a bed to a chair (including a wheelchair)?: Total Help needed standing up from a chair using your arms (e.g., wheelchair or bedside chair)?: A Lot Help needed to walk in hospital room?: Total Help needed climbing 3-5 steps with a railing? : Total 6 Click Score: 10    End of  Session Equipment Utilized During Treatment: Gait belt Activity Tolerance: Patient limited by fatigue Patient left: in chair;with call bell/phone within reach;with chair alarm set Nurse Communication: Mobility status;Precautions;Other (comment)(Pt's pain status) PT Visit Diagnosis: Other abnormalities of gait and mobility (R26.89);Muscle weakness (generalized) (M62.81);Difficulty in walking, not elsewhere classified (R26.2);Pain Pain - Right/Left: Left Pain - part of body: Knee    Time: 4540-9811 PT Time Calculation (min) (ACUTE ONLY): 22 min   Charges:   PT Evaluation $PT Eval Low Complexity: 1 Low PT Treatments $Therapeutic Activity: 8-22 mins       Hendricks Limes, PT 07/14/18, 10:29 AM [phone]

## 2018-07-14 NOTE — Progress Notes (Signed)
PT Cancellation Note  Patient Details Name: Tiffany Barber MRN: [id number] DOB: 09/29/1940   Cancelled Treatment:    Reason Eval/Treat Not Completed: Patient declined, no reason specified.  PT consult received.  Chart reviewed.  Pt refused PT d/t wanting to sleep instead.  Will re-attempt PT evaluation at a later date/time.  Hendricks Limes, PT 07/14/18, 9:11 AM [phone]

## 2018-07-14 NOTE — Progress Notes (Signed)
eeg completed ° °

## 2018-07-14 NOTE — Progress Notes (Signed)
[facility] [place] [facility]   PATIENT NAME: Tiffany Barber    MR#:  [id number]  DATE OF BIRTH:  June 02, 1940  SUBJECTIVE:   sob improved Worked with PT this morning and they are recommending skilled nursing facility placement.  REVIEW OF SYSTEMS:    Review of Systems  Constitutional: Negative for fever, chills weight loss HENT: Negative for ear pain, nosebleeds, congestion, facial swelling, rhinorrhea, neck pain, neck stiffness and ear discharge.   Respiratory: Negative for cough, ++shortness of breath (better), no wheezing  Cardiovascular: Negative for chest pain, palpitations and ++leg swelling (improved).  Gastrointestinal: Negative for heartburn, abdominal pain, vomiting, diarrhea no consitpation Genitourinary: Negative for dysuria, urgency, frequency, hematuria Musculoskeletal: Negative for back pain or joint pain Neurological: Negative for dizziness, seizures, syncope, focal weakness,  numbness and headaches.  Hematological: Does not bruise/bleed easily.  Psychiatric/Behavioral: Negative for hallucinations, confusion, dysphoric mood    Tolerating Diet: yes      DRUG ALLERGIES:   Allergies  Allergen Reactions  . Ace Inhibitors Other (See Comments)    Other reaction(s): angioedema resulting in intubation Other reaction(s): angioedema resulting in intubation   . Nifedipine Swelling and Shortness Of Breath  . Lisinopril     VITALS:  Blood pressure 96/70, pulse 89, temperature 97.8 F (36.6 C), temperature source Oral, resp. rate 18, height 5' (1.524 m), weight 97 kg, SpO2 91 %.  PHYSICAL EXAMINATION:  Constitutional: Appears well-developed and well-nourished. No distress. HENT: Normocephalic. Marland Kitchen Oropharynx is clear and moist.  Eyes: Conjunctivae and EOM are normal. PERRLA, no scleral icterus.  Neck: Normal ROM. Neck supple. No JVD. No tracheal deviation. CVS: RRR, S1/S2 +, no murmurs, no gallops, no carotid bruit.  Pulmonary: Effort and  breath sounds normal, no stridor, rhonchi, wheezes, rales.  Abdominal: Soft. BS +,  no distension, tenderness, rebound or guarding.  Musculoskeletal: Normal range of motion. and no tenderness.  Neuro: Alert. CN 2-12 grossly intact. No focal deficits. Skin: Skin is warm and dry. No rash noted. Psychiatric: Normal mood and affect.      LABORATORY PANEL:   CBC Recent Labs  Lab 07/12/18 0434  WBC 10.5  HGB 8.8*  HCT 27.3*  PLT 409*   ------------------------------------------------------------------------------------------------------------------  Chemistries  Recent Labs  Lab 07/11/18 1019  07/14/18 0501  NA 134*   < > 133*  K 4.8   < > 3.1*  CL 97*   < > 89*  CO2 27   < > 30  GLUCOSE 205*   < > 208*  BUN 41*   < > 52*  CREATININE 0.69   < > 0.88  CALCIUM 9.3   < > 8.8*  MG 3.2*  --   --   AST 50*  --   --   ALT 63*  --   --   ALKPHOS 106  --   --   BILITOT 0.9  --   --    < > = values in this interval not displayed.   ------------------------------------------------------------------------------------------------------------------  Cardiac Enzymes Recent Labs  Lab 07/11/18 1019  TROPONINI <0.03   ------------------------------------------------------------------------------------------------------------------  RADIOLOGY:  No results found.   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  1.  Acute hypoxic respiratory failure in the setting of acute on chronic diastolic heart failure with preserved ejection fraction: Continue to wean oxygen to room air as tolerated  Continue Lasix 80 IV twice daily  monitor intake and output with daily weight.   CHF clinic upon discharge. Cardiology consultation appreciated. -2.2 L(not sure this is accurate though)  2.  Essential hypertension: Continue Coreg9BP noted  this am) Norvasc discontinued due to lower extremity edema which Norvasc may be contributing to.   3.  Hyperlipidemia: Continue statin  4.  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  Anemia of chronic disease: Monitor hemoglobin  6.  Constipation: Stool softeners ordered along with enema and suppository.  7.  Hypokalemia: Replete and recheck in a.m. 8.  Hyponatremia from underlying CHF which has been stable.   Physical therapy is recommending skilled nursing facility upon discharge Gust with patient son yesterday and will attempt to call today for an update. Management plans discussed with the patient and she is in agreement.  CODE STATUS: full  TOTAL TIME TAKING CARE OF THIS PATIENT: 27 minutes.     POSSIBLE D/C 1-2 days, DEPENDING ON CLINICAL CONDITION.   Adrian SaranSital Exa Bomba M.D on 07/14/2018 at 11:45 AM  Between 7am to 6pm - Pager - [phone] After 6pm go to [email or web address] - password [email or web address]  [facility]  Office  [phone]  CC: Primary care physician; Emi BelfastGessner, Deborah B, FNP  Note: This dictation was prepared with Dragon dictation along with smaller phrase technology. Any transcriptional errors that result from this process are unintentional.

## 2018-07-14 NOTE — Care Management Note (Signed)
Case Management Note  Patient Details  Name: Tiffany Barber MRN: [id number] Date of Birth: 04-Mar-1940  Subjective/Objective:                  Patient was admitted for acute respiratory failure with hypoxia due to acute diastolic congestive heart failure on 5/25.  Patient had a pacemaker placed about a week ago for second degree heart block.  .  Patient admitted to telemetry.  Today patient was found unresponsive in her room and code blue called.  Patient was in PEA arrest, required intubation and was transferred to the ICU.  Patient currently in the ICU intubated and sedated.  RNCM will cont to monitor patient progress.   Action/Plan:   Expected Discharge Date:  07/14/18               Expected Discharge Plan:     In-House Referral:     Discharge planning Services     Post Acute Care Choice:    Choice offered to:     DME Arranged:    DME Agency:     HH Arranged:    HH Agency:     Status of Service:     If discussed at [employer], dates discussed:    Additional Comments:  Allayne Butcher, RN 07/14/2018, 2:57 PM

## 2018-07-14 NOTE — Consult Note (Addendum)
° °Name: Tiffany Barber °MRN: [id number] °DOB: 07/25/1940   ° °ADMISSION DATE:  07/11/2018 °CONSULTATION DATE: 07/14/2018 ° °REFERRING MD : Dr. Mody ° °CHIEF COMPLAINT: Cardiac Arrest  ° °BRIEF PATIENT DESCRIPTION:  °78 yo female admitted to telemetry unit with acute hypoxic respiratory failure secondary to acute CHF exacerbation required transfer to ICU 05/28 following PEA arrest requiring mechanical intubation  ° °SIGNIFICANT EVENTS/STUDIES:  °05/25-Pt admitted to the telemetry unit with acute respiratory failure due to acute diastolic CHF  °05/28-Code Blue initiated pt PEA arrested requiring mechanical intubation and transfer to ICU  ° °HISTORY OF PRESENT ILLNESS:   °This is a 78 yo female with a PMH of Stroke, Left Ventricular Hypertrophy, HTN, Hyperlipidemia, and Arthritis.  Pt presented to [facility] ER on 05/25 from home via EMS with worsening shortness of breath, shoulder pain, and weakness onset of symptoms 3 days prior to presentation.  She underwent dual chamber pacemaker placement on 06/30/2018 after failing conservative treatment of second degree heart block.  Per ER notes pts family reported the pts lasix was stopped while she was hospitalized for pacemaker placement, and they restarted the lasix due to pt developing peripheral edema.  She does not wear home O2, however she started using her son's oxygen a few days prior to presentation.  Upon EMS arrival at pts home her O2 sats were 96% on 3L via nasal canula.  Initial EKG revealed atrial sensing with ventricular pacing.  CXR concerning for cardiomegaly and mild vascular congestion.  Lab results revealed NA+ 134, BUN 41, AST 50, ALT 63, BNP 145, troponin <0.03, lactic acid 1.6, and COVID-19 negative.  Pt received iv lasix and nebulized albuterol.  She was subsequently admitted to the telemetry unit for additional workup and treatment. On 05/28 pt found unresponsive and pulseless cardiac rhythm PEA, Code Blue initiated at 1243 pm with ROSC at 1256 pm (she  received 3 amps of epinephrine) she required mechanical intubation. Pt subsequently transferred to ICU for additional workup and treatment.    ° °PAST MEDICAL HISTORY :  ° has a past medical history of Arthritis, Hyperlipidemia, Hypertension, LVH (left ventricular hypertrophy), and Stroke (HCC). ° has a past surgical history that includes Pacemaker insertion (N/A, 06/30/2018); Replacement total knee bilateral; and Joint replacement (Left, 2019). °Prior to Admission medications   °Medication Sig Start Date End Date Taking? Authorizing Provider  °albuterol (VENTOLIN HFA) 108 (90 Base) MCG/ACT inhaler Inhale 2 puffs into the lungs every 4 (four) hours as needed for wheezing. 07/08/18  Yes Gessner, Deborah B, FNP  °amLODipine (NORVASC) 10 MG tablet Take 10 mg by mouth daily.   Yes [provider]  °aspirin 81 MG EC tablet Take 81 mg by mouth 2 (two) times a day.    Yes [provider]  °hydrochlorothiazide (HYDRODIURIL) 25 MG tablet Take 25 mg by mouth daily. 05/23/14  Yes [provider]  °lovastatin (MEVACOR) 20 MG tablet Take 20 mg by mouth daily.    Yes [provider]  °meloxicam (MOBIC) 15 MG tablet Take 15 mg by mouth daily. 05/23/14  Yes [provider]  °Spacer/Aero-Holding Chambers DEVI 1 Units by Does not apply route as needed. 07/08/18  Yes Gessner, Deborah B, FNP  °traMADol (ULTRAM) 50 MG tablet Take 50 mg by mouth at bedtime.    Yes [provider]  ° °Allergies  °Allergen Reactions  °• Ace Inhibitors Other (See Comments)  °  Other reaction(s): angioedema resulting in intubation °Other reaction(s): angioedema resulting in intubation °  °•   Nifedipine Swelling and Shortness Of Breath  °• Lisinopril   ° ° °FAMILY HISTORY:  °family history includes Diabetes in her daughter; Hyperlipidemia in her daughter; Hypertension in her mother; Stroke in her mother. °SOCIAL HISTORY: ° reports that she has never smoked. She has never used smokeless tobacco. She reports that  she does not drink alcohol or use drugs. ° °REVIEW OF SYSTEMS:   °Unable to assess pt intubated  ° °SUBJECTIVE:  °Unable to assess pt intubated ° °VITAL SIGNS: °Temp:  [97.6 °F (36.4 °C)-98.5 °F (36.9 °C)] 97.8 °F (36.6 °C) (05/28 0811) °Pulse Rate:  [71-89] 89 (05/28 0811) °Resp:  [18-20] 18 (05/28 0811) °BP: (96-146)/(70-86) 96/70 (05/28 0811) °SpO2:  [91 %-100 %] 91 % (05/28 0811) °Weight:  [97 kg] 97 kg (05/28 0342) ° °PHYSICAL EXAMINATION: °General: acutely ill appearing female, in respiratory distress mechanically intubated  °Neuro: not following commands, pupils unequal right pupil 2 mm sluggish left pupil 3 mm nonreactive °HEENT: supple, no JVD  °Cardiovascular: A-V paced, no R/G  °Lungs: faint rhonchi and diminished throughout, even, non labored  °Abdomen: +BS x4, soft, obese, non distended  °Musculoskeletal: 2+ generalized edema  °Skin: intact no rashes or lesions present  ° °Recent Labs  °Lab 07/12/18 °0434 07/13/18 °0401 07/14/18 °0501  °NA 135 133* 133*  °K 4.2 4.1 3.1*  °CL 94* 92* 89*  °CO2 28 29 30  °BUN 44* 56* 52*  °CREATININE 0.76 0.95 0.88  °GLUCOSE 138* 131* 208*  ° °Recent Labs  °Lab 07/11/18 °1019 07/12/18 °0434  °HGB 9.5* 8.8*  °HCT 30.4* 27.3*  °WBC 10.3 10.5  °PLT 433* 409*  ° °No results found. ° °ASSESSMENT / PLAN: ° °Acute on chronic hypoxic respiratory failure secondary to acute diastolic CHF exacerbation  °Mechanical intubation post PEA arrest  °Full vent support for now-vent settings reviewed established  °SBT once all parameters met  °VAP bundle implemented  °Scheduled and prn bronchodilator therapy  ° °PEA arrest etiology unclear  °Acute on chronic diastolic CHF (EF 60%-65%) °Dual chamber pacemaker for second degree AV block (placed by Dr. Paraschos 06/30/2018) °Hx: Stroke, HTN, Hyperlipidemia, and Left ventricular hypertrophy) °Continuous telemetry monitoring  °Troponin and EKG pending  °Maintain map >65 °Hold outpatient antihypertensives for now °Continue aspirin and pravachol for  now  °IV lasix as bp permits  °Cardiology consulted appreciate input °Pt unresponsive and not following commands post cardiac arrest if CT Head negative will initiate hypothermic protocol @36 degrees C ° °Hypokalemia  °Trend BMP  °Replace electrolytes as indicated  °Monitor UOP °Avoid nephrotoxic medications  ° °Anemia without obvious acute blood loss  °VTE px: subq lovenox °Trend CBC  °Monitor for s/sx of bleeding and transfuse for hgb <7 ° °UTI °Trend WBC and monitor fever curve °Will check PCT  °Follow urine culture °Will start ceftriaxone ° °Acute encephalopathy s/p PEA arrest concerning for possible anoxic injury °Mechanical intubation discomfort/pain  °Maintain RASS goal 0 to -1 °Propofol gtt and prn fentanyl to maintain RASS goal  °Stat CT Head pending  ° °Dana Blakeney, AGNP  °Pulmonary/Critical Care °Pager [phone] (please enter 7 digits) °PCCM Consult Pager [phone] (please enter 7 digits)  ° ° °

## 2018-07-15 ENCOUNTER — Inpatient Hospital Stay: Payer: Medicare Other

## 2018-07-15 DIAGNOSIS — I469 Cardiac arrest, cause unspecified: Secondary | ICD-10-CM

## 2018-07-15 LAB — BASIC METABOLIC PANEL
Anion gap: 14 (ref 5–15)
Anion gap: 14 (ref 5–15)
BUN: 56 mg/dL — ABNORMAL HIGH (ref 8–23)
BUN: 50 mg/dL — ABNORMAL HIGH (ref 8–23)
CO2: 32 mmol/L (ref 22–32)
CO2: 33 mmol/L — ABNORMAL HIGH (ref 22–32)
Calcium: 8.7 mg/dL — ABNORMAL LOW (ref 8.9–10.3)
Calcium: 8.8 mg/dL — ABNORMAL LOW (ref 8.9–10.3)
Chloride: 87 mmol/L — ABNORMAL LOW (ref 98–111)
Chloride: 89 mmol/L — ABNORMAL LOW (ref 98–111)
Creatinine, Ser: 0.91 mg/dL (ref 0.44–1.00)
Creatinine, Ser: 0.84 mg/dL (ref 0.44–1.00)
GFR calc Af Amer: >60 mL/min (ref >60)
GFR calc Af Amer: >60 mL/min (ref >60)
GFR calc non Af Amer: >60 mL/min (ref >60)
GFR calc non Af Amer: >60 mL/min (ref >60)
Glucose, Bld: 150 mg/dL — ABNORMAL HIGH (ref 70–99)
Glucose, Bld: 135 mg/dL — ABNORMAL HIGH (ref 70–99)
Potassium: 2.2 mmol/L — CL (ref 3.5–5.1)
Potassium: 2.5 mmol/L — CL (ref 3.5–5.1)
Sodium: 133 mmol/L — ABNORMAL LOW (ref 135–145)
Sodium: 136 mmol/L (ref 135–145)

## 2018-07-15 LAB — BLOOD GAS, ARTERIAL
Acid-Base Excess: 14.4 mmol/L — ABNORMAL HIGH (ref 0.0–2.0)
Bicarbonate: 37.4 mmol/L — ABNORMAL HIGH (ref 20.0–28.0)
FIO2: 0.4
MECHVT: 500 mL
O2 Saturation: 98.6 %
PEEP: 7 cmH2O
Patient temperature: 37
RATE: 15 resp/min
pCO2 arterial: 39 mmHg (ref 32.0–48.0)
pH, Arterial: 7.59 — ABNORMAL HIGH (ref 7.350–7.450)
pO2, Arterial: 99 mmHg (ref 83.0–108.0)

## 2018-07-15 LAB — HEPATIC FUNCTION PANEL
ALT: 139 U/L — ABNORMAL HIGH (ref 0–44)
AST: 141 U/L — ABNORMAL HIGH (ref 15–41)
Albumin: 3 g/dL — ABNORMAL LOW (ref 3.5–5.0)
Alkaline Phosphatase: 135 U/L — ABNORMAL HIGH (ref 38–126)
Bilirubin, Direct: 0.4 mg/dL — ABNORMAL HIGH (ref 0.0–0.2)
Indirect Bilirubin: 0.8 mg/dL (ref 0.3–0.9)
Total Bilirubin: 1.2 mg/dL (ref 0.3–1.2)
Total Protein: 6.1 g/dL — ABNORMAL LOW (ref 6.5–8.1)

## 2018-07-15 LAB — CBC
HCT: 26.4 % — ABNORMAL LOW (ref 36.0–46.0)
Hemoglobin: 8.7 g/dL — ABNORMAL LOW (ref 12.0–15.0)
MCH: 30.6 pg (ref 26.0–34.0)
MCHC: 33 g/dL (ref 30.0–36.0)
MCV: 93 fL (ref 80.0–100.0)
Platelets: 495 10*3/uL — ABNORMAL HIGH (ref 150–400)
RBC: 2.84 MIL/uL — ABNORMAL LOW (ref 3.87–5.11)
RDW: 13.4 % (ref 11.5–15.5)
WBC: 21.4 10*3/uL — ABNORMAL HIGH (ref 4.0–10.5)
nRBC: 0 % (ref 0.0–0.2)

## 2018-07-15 LAB — PROCALCITONIN: Procalcitonin: 0.72 ng/mL

## 2018-07-15 LAB — TROPONIN I
Troponin I: 0.63 ng/mL (ref <0.03)
Troponin I: 0.54 ng/mL (ref <0.03)
Troponin I: 0.45 ng/mL (ref <0.03)

## 2018-07-15 LAB — BRAIN NATRIURETIC PEPTIDE: B Natriuretic Peptide: 537 pg/mL — ABNORMAL HIGH (ref 0.0–100.0)

## 2018-07-15 LAB — MAGNESIUM
Magnesium: 2.3 mg/dL (ref 1.7–2.4)
Magnesium: 2.3 mg/dL (ref 1.7–2.4)
Magnesium: 2.8 mg/dL — ABNORMAL HIGH (ref 1.7–2.4)

## 2018-07-15 LAB — GLUCOSE, CAPILLARY
Glucose-Capillary: 130 mg/dL — ABNORMAL HIGH (ref 70–99)
Glucose-Capillary: 125 mg/dL — ABNORMAL HIGH (ref 70–99)
Glucose-Capillary: 115 mg/dL — ABNORMAL HIGH (ref 70–99)
Glucose-Capillary: 124 mg/dL — ABNORMAL HIGH (ref 70–99)
Glucose-Capillary: 102 mg/dL — ABNORMAL HIGH (ref 70–99)
Glucose-Capillary: 107 mg/dL — ABNORMAL HIGH (ref 70–99)

## 2018-07-15 LAB — POTASSIUM: Potassium: 2.9 mmol/L — ABNORMAL LOW (ref 3.5–5.1)

## 2018-07-15 LAB — TRIGLYCERIDES: Triglycerides: 111 mg/dL (ref <150)

## 2018-07-15 MED ORDER — POTASSIUM CHLORIDE 10 MEQ/50ML IV SOLN
10.0000 meq | INTRAVENOUS | Status: AC
  Administered 2018-07-15 (×6): 10 meq via INTRAVENOUS
  Filled 2018-07-15 (×6): qty 50

## 2018-07-15 MED ORDER — POTASSIUM CHLORIDE 10 MEQ/50ML IV SOLN
10.0000 meq | INTRAVENOUS | Status: AC
  Administered 2018-07-15 (×6): 10 meq via INTRAVENOUS
  Filled 2018-07-15 (×6): qty 50

## 2018-07-15 MED ORDER — VECURONIUM BROMIDE 10 MG IV SOLR
10.0000 mg | INTRAVENOUS | Status: DC | PRN
  Administered 2018-07-15 – 2018-07-20 (×8): 10 mg via INTRAVENOUS
  Filled 2018-07-15 (×9): qty 10

## 2018-07-15 MED ORDER — MAGNESIUM SULFATE 2 GM/50ML IV SOLN
2.0000 g | Freq: Once | INTRAVENOUS | Status: AC
  Administered 2018-07-15: 2 g via INTRAVENOUS
  Filled 2018-07-15: qty 50

## 2018-07-15 MED ORDER — VECURONIUM BROMIDE 10 MG IV SOLR
10.0000 mg | Freq: Once | INTRAVENOUS | Status: AC
  Administered 2018-07-15: 10 mg via INTRAVENOUS

## 2018-07-15 MED ORDER — VALPROATE SODIUM 500 MG/5ML IV SOLN
500.0000 mg | Freq: Three times a day (TID) | INTRAVENOUS | Status: DC
  Administered 2018-07-15 – 2018-07-20 (×16): 500 mg via INTRAVENOUS
  Filled 2018-07-15 (×22): qty 5

## 2018-07-15 MED ORDER — INSULIN ASPART 100 UNIT/ML ~~LOC~~ SOLN
0.0000 [IU] | SUBCUTANEOUS | Status: DC
  Administered 2018-07-15 – 2018-07-19 (×2): 2 [IU] via SUBCUTANEOUS
  Filled 2018-07-15 (×2): qty 1

## 2018-07-15 MED ORDER — CHLORHEXIDINE GLUCONATE CLOTH 2 % EX PADS
6.0000 | MEDICATED_PAD | Freq: Every day | CUTANEOUS | Status: DC
  Administered 2018-07-16: 6 via TOPICAL

## 2018-07-15 MED ORDER — SODIUM CHLORIDE 0.9% FLUSH: 10.0000 mL | INTRAVENOUS | Status: DC | PRN

## 2018-07-15 MED ORDER — ACETAZOLAMIDE SODIUM 500 MG IJ SOLR
250.0000 mg | Freq: Once | INTRAMUSCULAR | Status: AC
  Administered 2018-07-15: 250 mg via INTRAVENOUS
  Filled 2018-07-15: qty 250

## 2018-07-15 MED ORDER — SODIUM CHLORIDE 0.9% FLUSH
10.0000 mL | Freq: Two times a day (BID) | INTRAVENOUS | Status: DC
  Administered 2018-07-15: 10 mL
  Administered 2018-07-15: 30 mL
  Administered 2018-07-16 – 2018-07-17 (×2): 10 mL
  Administered 2018-07-17: 10:00:00 30 mL
  Administered 2018-07-18 (×2): 10 mL
  Administered 2018-07-19: 20 mL
  Administered 2018-07-19 – 2018-07-20 (×2): 10 mL

## 2018-07-15 NOTE — Progress Notes (Signed)
eeg completed ° °

## 2018-07-15 NOTE — Progress Notes (Signed)
Patient Name: Tiffany Barber Date of Encounter: 07/15/2018  Hospital Problem List     Active Problems:   Acute diastolic CHF (congestive heart failure) [facility])    Patient Profile        78 year old female admitted with second-degree heart block with symptomatic bradycardia.  Had permanent pacemaker placed.  Was discharged home return with shortness of breath.  She was readmitted after developing worsening shortness of breath. Was being treated with diuresis although was found to be unresponsive and felt to be in PEA.  She was transferred to the ICU.  She is currently unresponsive.  She is getting code ice.  She is AV pacing.  She is on empiric broad-spectrum antibiotics. She is requiring pressors. Subjective   Unresponsive  Inpatient Medications    . aspirin  81 mg Per Tube Daily  . chlorhexidine gluconate (MEDLINE KIT)  15 mL Mouth Rinse BID  . Chlorhexidine Gluconate Cloth  6 each Topical Daily  . docusate  100 mg Per Tube BID  . enoxaparin (LOVENOX) injection  40 mg Subcutaneous Q12H  . furosemide  80 mg Intravenous Q12H  . insulin aspart  0-15 Units Subcutaneous Q4H  . ipratropium-albuterol  3 mL Nebulization TID  . mouth rinse  15 mL Mouth Rinse 10 times per day  . pravastatin  20 mg Per Tube q1800  . sodium chloride flush  10-40 mL Intracatheter Q12H  . sodium chloride flush  3 mL Intravenous Q12H    Vital Signs    Vitals:   07/15/18 1423 07/15/18 1430 07/15/18 1445 07/15/18 1500  BP:  (!) 123/53 (!) 121/52 (!) 119/50  Pulse:  76 75 74  Resp:  '19 15 12  ' Temp:    (!) 96.8 F (36 C)  TempSrc:    Rectal  SpO2: 99% 99% 99% 99%  Weight:      Height:        Intake/Output Summary (Last 24 hours) at 07/15/2018 1515 Last data filed at 07/15/2018 1500 Gross per 24 hour  Intake 1915.85 ml  Output 3585 ml  Net -1669.15 ml   Filed Weights   07/13/18 0421 07/14/18 0342 07/15/18 0440  Weight: 97.2 kg 97 kg 97.3 kg    Physical Exam    GEN: Acutely ill-appearing  female intubated in ICU bed HEENT: normal.  Neck: Supple, no JVD, carotid bruits, or masses. Cardiac: Paced regular rhythm Respiratory:  Respirations regular and unlabored, clear to auscultation bilaterally. GI: Soft, nontender, nondistended, BS + x 4. MS: no deformity or atrophy. Skin: warm and dry, no rash. Neuro: Intubated and sedated unresponsive  Labs    CBC Recent Labs    07/14/18 1506 07/15/18 0330  WBC 19.3* 21.4*  NEUTROABS 17.2*  --   HGB 9.3* 8.7*  HCT 29.6* 26.4*  MCV 99.3 93.0  PLT 481* 034*   Basic Metabolic Panel Recent Labs    07/14/18 1506 07/15/18 0142 07/15/18 0330 07/15/18 0749 07/15/18 1314  NA 131* 133*  --   --  136  K 3.9 2.2*  --   --  2.5*  CL 88* 87*  --   --  89*  CO2 25 32  --   --  33*  GLUCOSE 165* 150*  --   --  135*  BUN 58* 56*  --   --  50*  CREATININE 1.21* 0.91  --   --  0.84  CALCIUM 9.1 8.7*  --   --  8.8*  MG 2.8*  --  2.3  2.3  --   PHOS 6.9*  --   --   --   --    Liver Function Tests Recent Labs    07/14/18 1506 07/15/18 0330  AST 199* 141*  ALT 156* 139*  ALKPHOS 151* 135*  BILITOT 1.0 1.2  PROT 6.3* 6.1*  ALBUMIN 3.1* 3.0*   No results for input(s): LIPASE, AMYLASE in the last 72 hours. Cardiac Enzymes Recent Labs    07/15/18 0142 07/15/18 0749 07/15/18 1314  TROPONINI 0.63* 0.54* 0.45*   BNP Recent Labs    07/14/18 1506 07/15/18 0431  BNP 250.0* 537.0*   D-Dimer No results for input(s): DDIMER in the last 72 hours. Hemoglobin A1C No results for input(s): HGBA1C in the last 72 hours. Fasting Lipid Panel Recent Labs    07/15/18 0330  TRIG 111   Thyroid Function Tests No results for input(s): TSH, T4TOTAL, T3FREE, THYROIDAB in the last 72 hours.  Invalid input(s): FREET3  Telemetry    AV pacing  ECG    AV pacing  Radiology    Dg Chest 1 View  Result Date: 07/14/2018 CLINICAL DATA:  Status post intubation and OG tube placement. EXAM: CHEST  1 VIEW COMPARISON:  Single-view of the  chest 07/11/2018. FINDINGS: Endotracheal tube is in place with the tip in good position just below the clavicular heads. OG tube tip is in the fundus of the stomach. The patient has small to moderate right pleural effusion and basilar airspace disease. Discoid atelectasis left mid lung and atelectasis in the left lung base noted. No pneumothorax. There is cardiomegaly. IMPRESSION: ET tube and OG tube in good position. Small to moderate right pleural effusion and basilar airspace disease have increased since the prior exam. No change in left basilar atelectasis. Electronically Signed   By: Inge Rise M.D.   On: 07/14/2018 13:54   Dg Abd 1 View  Result Date: 07/14/2018 CLINICAL DATA:  Enteric tube placement. EXAM: ABDOMEN - 1 VIEW COMPARISON:  Abdominal x-ray dated Jun 30, 2010. FINDINGS: Enteric tube in the stomach with the tip in the gastric fundus. Small right pleural effusion. The bowel gas pattern is normal. No radio-opaque calculi or other significant radiographic abnormality are seen. IMPRESSION: 1. Enteric tube in the stomach. 2. Small right pleural effusion. Electronically Signed   By: Titus Dubin M.D.   On: 07/14/2018 13:53   Ct Head Wo Contrast  Result Date: 07/14/2018 CLINICAL DATA:  Status post resuscitation. EXAM: CT HEAD WITHOUT CONTRAST TECHNIQUE: Contiguous axial images were obtained from the base of the skull through the vertex without intravenous contrast. COMPARISON:  09/28/2017 FINDINGS: Examination is quite limited due to patient motion despite rescanning attempts. Brain: The ventricles are in the midline without mass effect or shift. No extra-axial fluid collections are identified. Stable periventricular white matter disease and remote left sided basal ganglia infarct. No findings for acute hemispheric infarction or intracranial hemorrhage. No mass lesions. The brainstem and cerebellum are grossly normal. Vascular: Stable vascular calcifications. No definite aneurysm or  hyperdense vessels. Skull: No skull fracture or bone lesion. Mild hyperostosis frontalis interna. Sinuses/Orbits: The paranasal sinuses and mastoid air cells are grossly clear. The globes are intact. Other: No scalp hematoma or mass. IMPRESSION: 1. Limited examination. 2. No definite CT findings for acute intracranial process or skull fracture. 3. 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 Electronically Signed   By: Marijo Sanes M.D.   On: 07/14/2018 14:28   Dg Chest [facility]  1 View  Result Date: 07/15/2018 CLINICAL DATA:  Respiratory failure. EXAM: PORTABLE CHEST 1 VIEW COMPARISON:  07/14/2018. FINDINGS: Endotracheal tube and NG tube in stable position. Cardiac pacer stable position. Heart size stable. Bibasilar pulmonary infiltrates/edema. Bibasilar atelectasis. Bilateral pleural effusions. No pneumothorax. IMPRESSION: 1.  Lines and tubes stable position. 2.  Cardiac pacer stable position. 3. Cardiomegaly with bibasilar pulmonary infiltrates/edema and bilateral pleural effusions suggesting CHF. Bibasilar pneumonia cannot be excluded. 4.  Bibasilar atelectasis.  Chest appears similar to prior exam. Electronically Signed   By: Marcello Moores  Register   On: 07/15/2018 06:16   Dg Chest Port 1 View  Result Date: 07/11/2018 CLINICAL DATA:  Shortness of breath EXAM: PORTABLE CHEST 1 VIEW COMPARISON:  Jul 11, 2018 study obtained earlier in the day FINDINGS: Cardiomegaly is stable. Pacemaker leads are attached to the right atrium and right ventricle. There is atelectatic change in the mid lung regions bilaterally. There is no frank edema or consolidation. No adenopathy evident. No bone lesions. IMPRESSION: Stable cardiomegaly with pacemaker lead positions unchanged. Atelectatic change in each mid lung. No edema or consolidation evident. Electronically Signed   By: Lowella Grip III M.D.   On: 07/11/2018 23:49   Dg Chest Portable 1 View  Result Date:  07/11/2018 CLINICAL DATA:  Shortness of breath EXAM: PORTABLE CHEST 1 VIEW COMPARISON:  06/29/2018, 06/30/2018 FINDINGS: There is mild bilateral interstitial prominence. There are low lung volumes likely partially accounting for the interstitial prominence. There is no focal consolidation, pleural effusion or pneumothorax. There is stable cardiomegaly. There is a dual lead cardiac pacemaker. The osseous structures are unremarkable. IMPRESSION: 1. Cardiomegaly with mild pulmonary vascular congestion. Interstitial markings are accentuated by low lung volumes. Electronically Signed   By: Kathreen Devoid   On: 07/11/2018 11:01   Dg Chest Port 1 View  Result Date: 06/30/2018 CLINICAL DATA:  Postop pacemaker insertion. EXAM: PORTABLE CHEST 1 VIEW COMPARISON:  06/29/2018 FINDINGS: The patient is status post placement of a dual chamber left-sided pacemaker. Of or is no evidence of a pneumothorax. The cardiac silhouette remains enlarged. There are prominent interstitial lung markings bilaterally consistent with pulmonary edema. The lung volumes are low. Bibasilar atelectasis is noted. There is no acute osseous abnormality. IMPRESSION: 1. Status post placement of a dual chamber left-sided pacemaker with no evidence of a pneumothorax. 2. Cardiomegaly with findings of pulmonary edema. Electronically Signed   By: Constance Holster M.D.   On: 06/30/2018 14:49   Dg Chest Portable 1 View  Result Date: 06/29/2018 CLINICAL DATA:  Cough.  Low oxygen saturation. EXAM: PORTABLE CHEST 1 VIEW COMPARISON:  09/25/2017 FINDINGS: Heart is enlarged. The aorta is tortuous. There is newly seen widespread bilateral pulmonary density. The pattern is nonspecific and could represent pneumonia or heart failure. Possible small amount of pleural effusion on each side. No significant bone finding. IMPRESSION: Cardiomegaly. Widespread abnormal lung density. The differential diagnosis is heart failure versus pneumonia. Electronically Signed   By:  Nelson Chimes M.D.   On: 06/29/2018 17:03   Dg C-arm 1-60 Min-no Report  Result Date: 06/30/2018 Fluoroscopy was utilized by the requesting physician.  No radiographic interpretation.    Assessment & Plan    PEA arrest-etiology unclear.  Pacemaker appears to be functioning normally at present.  Will follow.  Seizure disorder-being evaluated by neurology.  Signed, Javier Docker Fath MD 07/15/2018, 3:15 PM  Pager: [phone]

## 2018-07-15 NOTE — Progress Notes (Signed)
Updated pts son Tiffany Barber via telephone regarding plan of care. 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  He stated he understood and all questions were answered   Sonda Rumble, AGNP  Pulmonary/Critical Care Pager [phone] (please enter 7 digits) PCCM Consult Pager [phone] (please enter 7 digits)

## 2018-07-15 NOTE — Progress Notes (Signed)
PT Cancellation Note  Patient Details Name: Tiffany Barber MRN: [id number] DOB: Aug 20, 1940   Cancelled Treatment:    Reason Eval/Treat Not Completed: Other (comment).  Pt transferred to CCU 07/14/18 and PT order cancelled.  Hendricks Limes, PT 07/15/18, 8:15 AM [phone]

## 2018-07-15 NOTE — Progress Notes (Signed)
CRITICAL VALUE ALERT  Critical Value:  K+ = 2.5  Date & Time Notied:  07/15/2018, 1350 hrs  Provider Notified: Dr. Karna Christmas  Orders Received/Actions taken: Check mag level, give mag and kcl replacement

## 2018-07-15 NOTE — Consult Note (Signed)
Reason for Consult: cardiac arrest  Referring Physician: Dr. Juliene Pina   CC: cardiac arrest   HPI: Tiffany Barber is an 78 y.o. female  hypertension, hyperlipidemia, left ventricular hypertrophy, stroke and recent heart block with pacemaker placement.  The patient presents with worsening shortness of breath for the past 5 day seen on 07/11/2018.   She feels chest tightness, cough, orthopnea and nocturnal dyspnea.   On 07/14/18 s/p PEA cardiac arrest without clear downtime.    Past Medical History:  Diagnosis Date  . 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 Hyperlipidemia   . Hypertension   . LVH (left ventricular hypertrophy)   . Stroke [facility])     Past Surgical History:  Procedure Laterality Date  . JOINT REPLACEMENT Left 2019   left knee  . PACEMAKER INSERTION N/A 06/30/2018   Procedure: INSERTION PACEMAKER;  Surgeon: Marcina Millard, MD;  Location: [facility] ORS;  Service: Cardiovascular;  Laterality: N/A;  . REPLACEMENT TOTAL KNEE BILATERAL      Family History  Problem Relation Age of Onset  . Hypertension Mother   . Stroke Mother   . Diabetes Daughter   . Hyperlipidemia Daughter     Social History:  reports that she has never smoked. She has never used smokeless tobacco. She reports that she does not drink alcohol or use drugs.  Allergies  Allergen Reactions  . Ace Inhibitors Other (See Comments)    Other reaction(s): angioedema resulting in intubation Other reaction(s): angioedema resulting in intubation   . Nifedipine Swelling and Shortness Of Breath  . Lisinopril     Medications: I have reviewed the patient's current medications.  ROS: Unable to obtain due to sedation   Physical Examination: Blood pressure (!) 113/46, pulse 65, temperature (!) 96.8 F (36 C), temperature source Rectal, resp. rate 12, height 5' (1.524 m), weight 97.3 kg, SpO2 99 %.  Does not follow commands No withdrawal from pain  Sluggish pupils No cough/corneals but on sedation.     Laboratory Studies:   Basic Metabolic Panel: Recent Labs  Lab 07/11/18 1019 07/12/18 0434 07/13/18 0401 07/14/18 0501 07/14/18 1506 07/15/18 0142 07/15/18 0330  NA 134* 135 133* 133* 131* 133*  --   K 4.8 4.2 4.1 3.1* 3.9 2.2*  --   CL 97* 94* 92* 89* 88* 87*  --   CO2 32  --   GLUCOSE 205* 138* 131* 208* 165* 150*  --   BUN 41* 44* 56* 52* 58* 56*  --   CREATININE 0.69 0.76 0.95 0.88 1.21* 0.91  --   CALCIUM 9.3 9.0 8.8* 8.8* 9.1 8.7*  --   MG 3.2*  --   --   --  2.8*  --  2.3  PHOS  --   --   --   --  6.9*  --   --     Liver Function Tests: Recent Labs  Lab 07/11/18 1019 07/14/18 1506 07/15/18 0330  AST 50* 199* 141*  ALT 63* 156* 139*  ALKPHOS 106 151* 135*  BILITOT 0.9 1.0 1.2  PROT 7.3 6.3* 6.1*  ALBUMIN 3.8 3.1* 3.0*   No results for input(s): LIPASE, AMYLASE in the last 168 hours. No results for input(s): AMMONIA in the last 168 hours.  CBC: Recent Labs  Lab 07/11/18 1019 07/12/18 0434 07/14/18 1506 07/15/18 0330  WBC 10.3 10.5 19.3* 21.4*  NEUTROABS 8.8*  --  17.2*  --   HGB 9.5* 8.8*  9.3* 8.7*  HCT 30.4* 27.3* 29.6* 26.4*  MCV 100.0 97.5 99.3 93.0  PLT 433* 409* 481* 495*    Cardiac Enzymes: Recent Labs  Lab 07/11/18 1019 07/14/18 1506 07/14/18 2017 07/15/18 0142 07/15/18 0749  TROPONINI <0.03 0.19* 0.42* 0.63* 0.54*    BNP: Invalid input(s): POCBNP  CBG: Recent Labs  Lab 07/14/18 1953 07/14/18 2341 07/15/18 0401 07/15/18 0726  GLUCAP 149* 145* 130* 125*    Microbiology: Results for orders placed or performed during the hospital encounter of 07/11/18  SARS Coronavirus 2 (CEPHEID- Performed in [facility] hospital lab), Hosp Order     Status: None   Collection Time: 07/11/18 10:19 AM  Result Value Ref Range Status   SARS Coronavirus 2 NEGATIVE NEGATIVE Final    Comment: (NOTE) If result is NEGATIVE SARS-CoV-2 target nucleic acids are NOT DETECTED. The SARS-CoV-2 RNA is generally detectable in upper and  lower  respiratory specimens during the acute phase of infection. The lowest  concentration of SARS-CoV-2 viral copies this assay can detect is 250  copies / mL. A negative result does not preclude SARS-CoV-2 infection  and should not be used as the sole basis for treatment or other  patient management decisions.  A negative result may occur with  improper specimen collection / handling, submission of specimen other  than nasopharyngeal swab, presence of viral mutation(s) within the  areas targeted by this assay, and inadequate number of viral copies  (<250 copies / mL). A negative result must be combined with clinical  observations, patient history, and epidemiological information. If result is POSITIVE SARS-CoV-2 target nucleic acids are DETECTED. The SARS-CoV-2 RNA is generally detectable in upper and lower  respiratory specimens dur ing the acute phase of infection.  Positive  results are indicative of active infection with SARS-CoV-2.  Clinical  correlation with patient history and other diagnostic information is  necessary to determine patient infection status.  Positive results do  not rule out bacterial infection or co-infection with other viruses. If result is PRESUMPTIVE POSTIVE SARS-CoV-2 nucleic acids MAY BE PRESENT.   A presumptive positive result was obtained on the submitted specimen  and confirmed on repeat testing.  While 2019 novel coronavirus  (SARS-CoV-2) nucleic acids may be present in the submitted sample  additional confirmatory testing may be necessary for epidemiological  and / or clinical management purposes  to differentiate between  SARS-CoV-2 and other Sarbecovirus currently known to infect humans.  If clinically indicated additional testing with an alternate test  methodology [phone]) is advised. The SARS-CoV-2 RNA is generally  detectable in upper and lower respiratory sp ecimens during the acute  phase of infection. The expected result is  Negative. Fact Sheet for Patients:  BoilerBrush.com.cy Fact Sheet for Healthcare Providers: https://pope.com/ This test is not yet approved or cleared by the [place] FDA and has been authorized for detection and/or diagnosis of SARS-CoV-2 by FDA under an Emergency Use Authorization (EUA).  This EUA will remain in effect (meaning this test can be used) for the duration of the COVID-19 declaration under Section 564(b)(1) of the Act, 21 U.S.C. section 360bbb-3(b)(1), unless the authorization is terminated or revoked sooner. Performed at [facility], [address] [place], [place] [address]     Coagulation Studies: Recent Labs    07/14/18 1505  LABPROT 17.1*  INR 1.4*    Urinalysis:  Recent Labs  Lab 07/13/18 0538  COLORURINE YELLOW*  LABSPEC 1.012  PHURINE 5.0  GLUCOSEU NEGATIVE  HGBUR NEGATIVE  BILIRUBINUR  NEGATIVE  KETONESUR NEGATIVE  PROTEINUR 30*  NITRITE NEGATIVE  LEUKOCYTESUR MODERATE*    Lipid Panel:     Component Value Date/Time   CHOL 193 11/15/2014   TRIG 111 07/15/2018 0330   HDL 59 11/15/2014   LDLCALC 114 11/15/2014    HgbA1C: No results found for: HGBA1C  Urine Drug Screen:  No results found for: LABOPIA, COCAINSCRNUR, LABBENZ, AMPHETMU, THCU, LABBARB  Alcohol Level: No results for input(s): ETH in the last 168 hours.  Other results: EKG: normal EKG, normal sinus rhythm, unchanged from previous tracings.  Imaging: Dg Chest 1 View  Result Date: 07/14/2018 CLINICAL DATA:  Status post intubation and OG tube placement. EXAM: CHEST  1 VIEW COMPARISON:  Single-view of the chest 07/11/2018. FINDINGS: Endotracheal tube is in place with the tip in good position just below the clavicular heads. OG tube tip is in the fundus of the stomach. The patient has small to moderate right pleural effusion and basilar airspace disease. Discoid atelectasis left mid lung and atelectasis in the left lung  base noted. No pneumothorax. There is cardiomegaly. IMPRESSION: ET tube and OG tube in good position. Small to moderate right pleural effusion and basilar airspace disease have increased since the prior exam. No change in left basilar atelectasis. Electronically Signed   By: Drusilla Kannerhomas  Dalessio M.D.   On: 07/14/2018 13:54   Dg Abd 1 View  Result Date: 07/14/2018 CLINICAL DATA:  Enteric tube placement. EXAM: ABDOMEN - 1 VIEW COMPARISON:  Abdominal x-ray dated Jun 30, 2010. FINDINGS: Enteric tube in the stomach with the tip in the gastric fundus. Small right pleural effusion. The bowel gas pattern is normal. No radio-opaque calculi or other significant radiographic abnormality are seen. IMPRESSION: 1. Enteric tube in the stomach. 2. Small right pleural effusion. Electronically Signed   By: Obie DredgeWilliam T Derry M.D.   On: 07/14/2018 13:53   Ct Head Wo Contrast  Result Date: 07/14/2018 CLINICAL DATA:  Status post resuscitation. EXAM: CT HEAD WITHOUT CONTRAST TECHNIQUE: Contiguous axial images were obtained from the base of the skull through the vertex without intravenous contrast. COMPARISON:  09/28/2017 FINDINGS: Examination is quite limited due to patient motion despite rescanning attempts. Brain: The ventricles are in the midline without mass effect or shift. No extra-axial fluid collections are identified. Stable periventricular white matter disease and remote left sided basal ganglia infarct. No findings for acute hemispheric infarction or intracranial hemorrhage. No mass lesions. The brainstem and cerebellum are grossly normal. Vascular: Stable vascular calcifications. No definite aneurysm or hyperdense vessels. Skull: No skull fracture or bone lesion. Mild hyperostosis frontalis interna. Sinuses/Orbits: The paranasal sinuses and mastoid air cells are grossly clear. The globes are intact. Other: No scalp hematoma or mass. IMPRESSION: 1. Limited examination. 2. No definite CT findings for acute intracranial process  or skull fracture. 3. 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 Electronically Signed   By: Rudie MeyerP.  Gallerani M.D.   On: 07/14/2018 14:28   Dg Chest Port 1 View  Result Date: 07/15/2018 CLINICAL DATA:  Respiratory failure. EXAM: PORTABLE CHEST 1 VIEW COMPARISON:  07/14/2018. FINDINGS: Endotracheal tube and NG tube in stable position. Cardiac pacer stable position. Heart size stable. Bibasilar pulmonary infiltrates/edema. Bibasilar atelectasis. Bilateral pleural effusions. No pneumothorax. IMPRESSION: 1.  Lines and tubes stable position. 2.  Cardiac pacer stable position. 3. Cardiomegaly with bibasilar pulmonary infiltrates/edema and bilateral pleural effusions suggesting CHF. Bibasilar pneumonia cannot be excluded. 4.  Bibasilar atelectasis.  Chest appears similar to  prior exam. Electronically Signed   By: Maisie Fus  Register   On: 07/15/2018 06:16     Assessment/Plan:  78 y.o. female  hypertension, hyperlipidemia, left ventricular hypertrophy, stroke and recent heart block with pacemaker placement.  The patient presents with worsening shortness of breath for the past 5 day seen on 07/11/2018.   She feels chest tightness, cough, orthopnea and nocturnal dyspnea.   On 07/14/18 s/p PEA cardiac arrest without clear downtime.   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  More information to follow post imaging.    Pauletta Browns  07/15/2018, 11:38 AM

## 2018-07-15 NOTE — Progress Notes (Signed)
CRITICAL CARE NOTE        SUBJECTIVE FINDINGS & SIGNIFICANT EVENTS   Patient remains critically ill Prognosis is guarded  Neuro exam without any purposeful movements.    EEG today. Repeat CTH pending  Neuro consult in progress  PAST MEDICAL HISTORY   Past Medical History:  Diagnosis Date   Arthritis    left knee and left hand in particular   Hyperlipidemia    Hypertension    LVH (left ventricular hypertrophy)    Stroke [facility])      SURGICAL HISTORY   Past Surgical History:  Procedure Laterality Date   JOINT REPLACEMENT Left 2019   left knee   PACEMAKER INSERTION N/A 06/30/2018   Procedure: INSERTION PACEMAKER;  Surgeon: Isaias Cowman, MD;  Location: [facility] ORS;  Service: Cardiovascular;  Laterality: N/A;   REPLACEMENT TOTAL KNEE BILATERAL       FAMILY HISTORY   Family History  Problem Relation Age of Onset   Hypertension Mother    Stroke Mother    Diabetes Daughter    Hyperlipidemia Daughter      SOCIAL HISTORY   Social History   Tobacco Use   Smoking status: Never Smoker   Smokeless tobacco: Never Used  Substance Use Topics   Alcohol use: No   Drug use: No     MEDICATIONS   Current Medication:  Current Facility-Administered Medications:    0.9 %  sodium chloride infusion, 250 mL, Intravenous, PRN, Demetrios Loll, MD   0.9 %  sodium chloride infusion, , Intravenous, Continuous, Blakeney, Dreama Saa, NP, Last Rate: 50 mL/hr at 07/15/18 0700   acetaminophen (TYLENOL) tablet 650 mg, 650 mg, Oral, Q6H PRN **OR** acetaminophen (TYLENOL) suppository 650 mg, 650 mg, Rectal, Q6H PRN, Demetrios Loll, MD   albuterol (PROVENTIL) (2.5 MG/3ML) 0.083% nebulizer solution 2.5 mg, 2.5 mg, Nebulization, Q2H PRN, Demetrios Loll, MD, 2.5 mg at 07/14/18 0444   aspirin chewable tablet  81 mg, 81 mg, Per Tube, Daily, Awilda Bill, NP   bisacodyl (DULCOLAX) suppository 10 mg, 10 mg, Rectal, Daily PRN, Awilda Bill, NP   cefTRIAXone (ROCEPHIN) 1 g in sodium chloride 0.9 % 100 mL IVPB, 1 g, Intravenous, q1800, Awilda Bill, NP, Stopped at 07/14/18 1750   chlorhexidine gluconate (MEDLINE KIT) (PERIDEX) 0.12 % solution 15 mL, 15 mL, Mouth Rinse, BID, Theseus Birnie, MD, 15 mL at 07/14/18 1923   Chlorhexidine Gluconate Cloth 2 % PADS 6 each, 6 each, Topical, Daily, Darel Hong D, NP   docusate (COLACE) 50 MG/5ML liquid 100 mg, 100 mg, Per Tube, BID, Awilda Bill, NP, 100 mg at 07/14/18 2121   enoxaparin (LOVENOX) injection 40 mg, 40 mg, Subcutaneous, Q12H, Mody, Sital, MD, 40 mg at 07/14/18 2120   famotidine (PEPCID) IVPB 20 mg premix, 20 mg, Intravenous, Q24H, Blakeney, Dreama Saa, NP, Stopped at 07/14/18 1915   fentaNYL 258mg in NS 2533m(1026mml) infusion-PREMIX, 0-400 mcg/hr, Intravenous, Continuous, Blakeney, DanDreama SaaP   furosemide (LASIX) injection 80 mg, 80 mg, Intravenous, Q12H, Mody, Sital, MD, 80 mg at 07/15/18 0505   ipratropium-albuterol (DUONEB) 0.5-2.5 (3) MG/3ML nebulizer solution 3 mL, 3 mL, Nebulization, TID, Mody, Sital, MD, 3 mL at 07/14/18 1926   levETIRAcetam (KEPPRA) IVPB 500 mg/100 mL premix, 500 mg, Intravenous, Q12H, KeeBradly BienenstockP, Stopped at 07/14/18 2156   MEDLINE mouth rinse, 15 mL, Mouth Rinse, 10 times per day, AleOttie GlazierD, 15 mL at 07/15/18 0609   midazolam (VERSED) injection 1 mg, 1 mg,  Intravenous, Q2H PRN, Darel Hong D, NP   norepinephrine (LEVOPHED) 24m in 253mpremix infusion, 0-40 mcg/min, Intravenous, Titrated, Blakeney, DaDreama SaaNP, Stopped at 07/15/18 0330   ondansetron (ZOFRAN) tablet 4 mg, 4 mg, Oral, Q6H PRN **OR** ondansetron (ZOFRAN) injection 4 mg, 4 mg, Intravenous, Q6H PRN, ChDemetrios LollMD   potassium chloride 10 mEq in 50 mL *CENTRAL LINE* IVPB, 10 mEq, Intravenous, Q1 Hr x 6, KeDarel Hong, NP, Last Rate: 50 mL/hr at 07/15/18 0646   pravastatin (PRAVACHOL) tablet 20 mg, 20 mg, Per Tube, q1800, BlAwilda BillNP, 20 mg at 07/14/18 1817   propofol (DIPRIVAN) 1000 MG/100ML infusion, 0-50 mcg/kg/min, Intravenous, Continuous, BlAwilda BillNP, Last Rate: 11.64 mL/hr at 07/15/18 0700, 20 mcg/kg/min at 07/15/18 0700   sodium chloride flush (NS) 0.9 % injection 10-40 mL, 10-40 mL, Intracatheter, Q12H, KeDarel Hong, NP, 30 mL at 07/15/18 0037   sodium chloride flush (NS) 0.9 % injection 10-40 mL, 10-40 mL, Intracatheter, PRN, KeDarel Hong, NP   sodium chloride flush (NS) 0.9 % injection 3 mL, 3 mL, Intravenous, Q12H, ChDemetrios LollMD, 3 mL at 07/14/18 1500   sodium chloride flush (NS) 0.9 % injection 3 mL, 3 mL, Intravenous, PRN, ChDemetrios LollMD   valproate (DEPACON) 250 mg in dextrose 5 % 50 mL IVPB, 250 mg, Intravenous, Q8H, KeDarel Hong, NP, Stopped at 07/15/18 0556   vecuronium (NORCURON) injection 10 mg, 10 mg, Intravenous, Q2H PRN, KeDarel Hong, NP, 10 mg at 07/15/18 [id number]  ALLERGIES   Ace inhibitors; Nifedipine; and Lisinopril    REVIEW OF SYSTEMS     Unable to obtain due to unresponsive mental state  PHYSICAL EXAMINATION   Vitals:   07/15/18 0645 07/15/18 0700  BP: (!) 118/43 (!) 113/49  Pulse: 64 64  Resp: (!) 24 (!) 22  Temp:  (!) 96.6 F (35.9 C)  SpO2: 100% 100%    GENERAL: GCS 3 T HEAD: Normocephalic, atraumatic.  EYES: Pupils equal, round, reactive to light.  No scleral icterus.  MOUTH: Moist mucosal membrane. NECK: Supple. No thyromegaly. No nodules. No JVD.  PULMONARY: Mild bibasilar crackles CARDIOVASCULAR: S1 and S2. Regular rate and rhythm. No murmurs, rubs, or gallops.  GASTROINTESTINAL: Soft, nontender, non-distended. No masses. Positive bowel sounds. No hepatosplenomegaly.  MUSCULOSKELETAL: No swelling, clubbing, or edema.  NEUROLOGIC: Mild distress due to acute illness SKIN:intact,warm,dry   LABS  AND IMAGING     LAB RESULTS: Recent Labs  Lab 07/14/18 0501 07/14/18 1506 07/15/18 0142  NA 133* 131* 133*  K 3.1* 3.9 2.2*  CL 89* 88* 87*  CO2 30 25 32  BUN 52* 58* 56*  CREATININE 0.88 1.21* 0.91  GLUCOSE 208* 165* 150*   Recent Labs  Lab 07/12/18 0434 07/14/18 1506 07/15/18 0330  HGB 8.8* 9.3* 8.7*  HCT 27.3* 29.6* 26.4*  WBC 10.5 19.3* 21.4*  PLT 409* 481* 495*     IMAGING RESULTS: Dg Chest 1 View  Result Date: 07/14/2018 CLINICAL DATA:  Status post intubation and OG tube placement. EXAM: CHEST  1 VIEW COMPARISON:  Single-view of the chest 07/11/2018. FINDINGS: Endotracheal tube is in place with the tip in good position just below the clavicular heads. OG tube tip is in the fundus of the stomach. The patient has small to moderate right pleural effusion and basilar airspace disease. Discoid atelectasis left mid lung and atelectasis in the left lung base noted. No pneumothorax. There is cardiomegaly. IMPRESSION: ET tube and  OG tube in good position. Small to moderate right pleural effusion and basilar airspace disease have increased since the prior exam. No change in left basilar atelectasis. Electronically Signed   By: Inge Rise M.D.   On: 07/14/2018 13:54   Dg Abd 1 View  Result Date: 07/14/2018 CLINICAL DATA:  Enteric tube placement. EXAM: ABDOMEN - 1 VIEW COMPARISON:  Abdominal x-ray dated Jun 30, 2010. FINDINGS: Enteric tube in the stomach with the tip in the gastric fundus. Small right pleural effusion. The bowel gas pattern is normal. No radio-opaque calculi or other significant radiographic abnormality are seen. IMPRESSION: 1. Enteric tube in the stomach. 2. Small right pleural effusion. Electronically Signed   By: Titus Dubin M.D.   On: 07/14/2018 13:53   Ct Head Wo Contrast  Result Date: 07/14/2018 CLINICAL DATA:  Status post resuscitation. EXAM: CT HEAD WITHOUT CONTRAST TECHNIQUE: Contiguous axial images were obtained from the base of the skull through  the vertex without intravenous contrast. COMPARISON:  09/28/2017 FINDINGS: Examination is quite limited due to patient motion despite rescanning attempts. Brain: The ventricles are in the midline without mass effect or shift. No extra-axial fluid collections are identified. Stable periventricular white matter disease and remote left sided basal ganglia infarct. No findings for acute hemispheric infarction or intracranial hemorrhage. No mass lesions. The brainstem and cerebellum are grossly normal. Vascular: Stable vascular calcifications. No definite aneurysm or hyperdense vessels. Skull: No skull fracture or bone lesion. Mild hyperostosis frontalis interna. Sinuses/Orbits: The paranasal sinuses and mastoid air cells are grossly clear. The globes are intact. Other: No scalp hematoma or mass. IMPRESSION: 1. Limited examination. 2. No definite CT findings for acute intracranial process or skull fracture. 3. 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 Electronically Signed   By: Marijo Sanes M.D.   On: 07/14/2018 14:28   Dg Chest Port 1 View  Result Date: 07/15/2018 CLINICAL DATA:  Respiratory failure. EXAM: PORTABLE CHEST 1 VIEW COMPARISON:  07/14/2018. FINDINGS: Endotracheal tube and NG tube in stable position. Cardiac pacer stable position. Heart size stable. Bibasilar pulmonary infiltrates/edema. Bibasilar atelectasis. Bilateral pleural effusions. No pneumothorax. IMPRESSION: 1.  Lines and tubes stable position. 2.  Cardiac pacer stable position. 3. Cardiomegaly with bibasilar pulmonary infiltrates/edema and bilateral pleural effusions suggesting CHF. Bibasilar pneumonia cannot be excluded. 4.  Bibasilar atelectasis.  Chest appears similar to prior exam. Electronically Signed   By: Marcello Moores  Register   On: 07/15/2018 06:16      ASSESSMENT AND PLAN    -Multidisciplinary rounds held today   CARDIAC FAILURE-status post PEA arrest with ACLS and  ROSC -poor prognosis - has not had any improvement in neuro status -follow up cardiac enzymes as indicated ICU monitoring    Acutely comatose - intubated  With low-dose sedation on propofol due to myoclonic jerking -Neurology consultation-appreciate input -That is post EEG and repeat CT head   GI/Nutrition GI PROPHYLAXIS as indicated DIET-->TF's as tolerated Constipation protocol as indicated  ENDO - ICU hypoglycemic\Hyperglycemia protocol -check FSBS per protocol   ELECTROLYTES -follow labs as needed -replace as needed -pharmacy consultation   DVT/GI PRX ordered -SCDs  TRANSFUSIONS AS NEEDED MONITOR FSBS ASSESS the need for LABS as needed   Critical care provider statement:    Critical care time (minutes):  33   Critical care time was exclusive of:  Separately billable procedures and treating other patients   Critical care was necessary to treat or prevent imminent or life-threatening deterioration of the  following conditions:   Cardiac arrest, unresponsive mental state, acutely comatose, systolic CHF, multiple comorbid condition   Critical care was time spent personally by me on the following activities:  Development of treatment plan with patient or surrogate, discussions with consultants, evaluation of patient's response to treatment, examination of patient, obtaining history from patient or surrogate, ordering and performing treatments and interventions, ordering and review of laboratory studies and re-evaluation of patient's condition.  I assumed direction of critical care for this patient from another provider in my specialty: no    This document was prepared using Dragon voice recognition software and may include unintentional dictation errors.    Ottie Glazier, M.D.  Division of [place]

## 2018-07-15 NOTE — Consult Note (Signed)
PHARMACY CONSULT NOTE - FOLLOW UP  Pharmacy Consult for Electrolyte Monitoring and Replacement   Recent Labs: Potassium (mmol/L)  Date Value  07/15/2018 2.5 (LL)  07/07/2013 3.5   Magnesium (mg/dL)  Date Value  93/23/5573 2.3   Calcium (mg/dL)  Date Value  22/03/5425 8.8 (L)   Calcium, Total (mg/dL)  Date Value  08/09/7626 9.7   Albumin (g/dL)  Date Value  31/51/7616 3.0 (L)  07/06/2013 4.0   Phosphorus (mg/dL)  Date Value  07/37/1062 6.9 (H)   Sodium (mmol/L)  Date Value  07/15/2018 136  11/15/2014 139  07/06/2013 138     Assessment: Patient coded on 08/03/2022, now code ice, will begin rewarming phase this afternoon.  Goal of Therapy:  Electrolytes wnl's  Plan:  5/29 16:00 K 2.5, MD has ordered KCl IV times 6 runs and Mag Sulfate 2g IV times 1. Will recheck K and Mag at 23:00. Will need to be cautious with replacement of K once begins rewarming phase.  Clovia Cuff, PharmD, BCPS 07/15/2018 3:58 PM

## 2018-07-15 NOTE — Progress Notes (Signed)
[employer] - [place] at [facility]   PATIENT NAME: Tiffany Barber    MR#:  [id number]  DATE OF BIRTH:  09-Mar-1940  SUBJECTIVE:   CODE BLUE was called yesterday afternoon patient was found to be in PEA arrest.  She was intubated and resuscitated.  She is currently on the ventilator in the ICU and sedated.  REVIEW OF SYSTEMS:    Unable to obtain as patient is sedated on vent   Tolerating Diet:npo      DRUG ALLERGIES:   Allergies  Allergen Reactions  . Ace Inhibitors Other (See Comments)    Other reaction(s): angioedema resulting in intubation Other reaction(s): angioedema resulting in intubation   . Nifedipine Swelling and Shortness Of Breath  . Lisinopril     VITALS:  Blood pressure (!) 112/44, pulse 64, temperature (!) 97 F (36.1 C), temperature source Rectal, resp. rate 12, height 5' (1.524 m), weight 97.3 kg, SpO2 100 %.  PHYSICAL EXAMINATION:  Constitutional: Intubated and sedated on ventilator   hENT: Normocephalic. . Intubated   eyes: Conjunctivae and EOM are normal. PERRLA, no scleral icterus.  Neck:  No JVD. No tracheal deviation. CVS: RRR, S1/S2 +, no murmurs, no gallops, no carotid bruit.  Pulmonary: Patient with bilateral crackles at bases Abdominal: Soft. BS +,  no distension, tenderness, rebound or guarding.  Musculoskeletal: 1+ lower extremity edema Neuro: Dated. Skin: Skin is warm and dry. No rash noted. Psychiatric: Sedated    LABORATORY PANEL:   CBC Recent Labs  Lab 07/15/18 0330  WBC 21.4*  HGB 8.7*  HCT 26.4*  PLT 495*   ------------------------------------------------------------------------------------------------------------------  Chemistries  Recent Labs  Lab 07/15/18 0142 07/15/18 0330  NA 133*  --   K 2.2*  --   CL 87*  --   CO2 32  --   GLUCOSE 150*  --   BUN 56*  --   CREATININE 0.91  --   CALCIUM 8.7*  --   MG  --  2.3  AST  --  141*  ALT  --  139*  ALKPHOS  --  135*  BILITOT  --  1.2    ------------------------------------------------------------------------------------------------------------------  Cardiac Enzymes Recent Labs  Lab 07/14/18 2017 07/15/18 0142 07/15/18 0749  TROPONINI 0.42* 0.63* 0.54*   ------------------------------------------------------------------------------------------------------------------  RADIOLOGY:  Dg Chest 1 View  Result Date: 07/14/2018 CLINICAL DATA:  Status post intubation and OG tube placement. EXAM: CHEST  1 VIEW COMPARISON:  Single-view of the chest 07/11/2018. FINDINGS: Endotracheal tube is in place with the tip in good position just below the clavicular heads. OG tube tip is in the fundus of the stomach. The patient has small to moderate right pleural effusion and basilar airspace disease. Discoid atelectasis left mid lung and atelectasis in the left lung base noted. No pneumothorax. There is cardiomegaly. IMPRESSION: ET tube and OG tube in good position. Small to moderate right pleural effusion and basilar airspace disease have increased since the prior exam. No change in left basilar atelectasis. Electronically Signed   By: Drusilla Kanner M.D.   On: 07/14/2018 13:54   Dg Abd 1 View  Result Date: 07/14/2018 CLINICAL DATA:  Enteric tube placement. EXAM: ABDOMEN - 1 VIEW COMPARISON:  Abdominal x-ray dated Jun 30, 2010. FINDINGS: Enteric tube in the stomach with the tip in the gastric fundus. Small right pleural effusion. The bowel gas pattern is normal. No radio-opaque calculi or other significant radiographic abnormality are seen. IMPRESSION: 1. Enteric tube in the stomach. 2. Small right pleural  effusion. Electronically Signed   By: Obie DredgeWilliam T Derry M.D.   On: 07/14/2018 13:53   Ct Head Wo Contrast  Result Date: 07/14/2018 CLINICAL DATA:  Status post resuscitation. EXAM: CT HEAD WITHOUT CONTRAST TECHNIQUE: Contiguous axial images were obtained from the base of the skull through the vertex without intravenous contrast. COMPARISON:   09/28/2017 FINDINGS: Examination is quite limited due to patient motion despite rescanning attempts. Brain: The ventricles are in the midline without mass effect or shift. No extra-axial fluid collections are identified. Stable periventricular white matter disease and remote left sided basal ganglia infarct. No findings for acute hemispheric infarction or intracranial hemorrhage. No mass lesions. The brainstem and cerebellum are grossly normal. Vascular: Stable vascular calcifications. No definite aneurysm or hyperdense vessels. Skull: No skull fracture or bone lesion. Mild hyperostosis frontalis interna. Sinuses/Orbits: The paranasal sinuses and mastoid air cells are grossly clear. The globes are intact. Other: No scalp hematoma or mass. IMPRESSION: 1. Limited examination. 2. No definite CT findings for acute intracranial process or skull fracture. 3. 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 Electronically Signed   By: Rudie MeyerP.  Gallerani M.D.   On: 07/14/2018 14:28   Dg Chest Port 1 View  Result Date: 07/15/2018 CLINICAL DATA:  Respiratory failure. EXAM: PORTABLE CHEST 1 VIEW COMPARISON:  07/14/2018. FINDINGS: Endotracheal tube and NG tube in stable position. Cardiac pacer stable position. Heart size stable. Bibasilar pulmonary infiltrates/edema. Bibasilar atelectasis. Bilateral pleural effusions. No pneumothorax. IMPRESSION: 1.  Lines and tubes stable position. 2.  Cardiac pacer stable position. 3. Cardiomegaly with bibasilar pulmonary infiltrates/edema and bilateral pleural effusions suggesting CHF. Bibasilar pneumonia cannot be excluded. 4.  Bibasilar atelectasis.  Chest appears similar to prior exam. Electronically Signed   By: Maisie Fushomas  Register   On: 07/15/2018 06:16     ASSESSMENT AND PLAN:   78 year old female status post pacemaker for second-degree AV block may 14th 2020 who initially presented emergency room due to shortness of breath and  subsequently CODE BLUE was called on May 28.  1.  Acute hypoxic respiratory failure due to acute on chronic diastolic heart failure status post mechanical intubation and status post PEA arrest Continue full vent support Management as per intensivist Continue Lasix Levophed to keep map greater than 65   2.  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 Dual-chamber pacemaker functioning Consider CT chest to rule out PE Head CT yesterday did not show acute pathology  3.  Hypokalemia: Replete PRN  4.  Hyponatremia in setting of CHF: Continue to monitor  Management plans discussed with the patient's son yesterday  CODE STATUS: full  TOTAL TIME TAKING CARE OF THIS PATIENT: 30 minutes.   Consider PC consult if no improvement in next few days  POSSIBLE D/C ??, DEPENDING ON CLINICAL CONDITION.   Adrian SaranSital Zalma Channing M.D on 07/15/2018 at 11:06 AM  Between 7am to 6pm - Pager - [phone] After 6pm go to [email or web address] - password [email or web address]  [facility]  Office  [phone]  CC: Primary care physician; Emi BelfastGessner, Deborah B, FNP  Note: This dictation was prepared with Dragon dictation along with smaller phrase technology. Any transcriptional errors that result from this process are unintentional.

## 2018-07-15 NOTE — Procedures (Signed)
Tiffany A. Merlene Laughter, MD     [email or web address]           HISTORY: This is a 78 year old lady who is unresponsive status post cardiac arrest.  Patient had a previous EEG showing normal background interrupted with frequent delta slowing.  This is a repeat recording to evaluate for neurological function and jerky activity/myoclonus worrisome for possible seizures.  MEDICATIONS:  Current Facility-Administered Medications:  .  0.9 %  sodium chloride infusion, 250 mL, Intravenous, PRN, Demetrios Loll, MD, Last Rate: 10 mL/hr at 07/15/18 1700 .  acetaminophen (TYLENOL) tablet 650 mg, 650 mg, Oral, Q6H PRN **OR** acetaminophen (TYLENOL) suppository 650 mg, 650 mg, Rectal, Q6H PRN, Demetrios Loll, MD .  albuterol (PROVENTIL) (2.5 MG/3ML) 0.083% nebulizer solution 2.5 mg, 2.5 mg, Nebulization, Q2H PRN, Demetrios Loll, MD, 2.5 mg at 07/14/18 0444 .  aspirin chewable tablet 81 mg, 81 mg, Per Tube, Daily, Awilda Bill, NP, 81 mg at 07/15/18 0905 .  bisacodyl (DULCOLAX) suppository 10 mg, 10 mg, Rectal, Daily PRN, Awilda Bill, NP .  cefTRIAXone (ROCEPHIN) 1 g in sodium chloride 0.9 % 100 mL IVPB, 1 g, Intravenous, q1800, Awilda Bill, NP, Last Rate: 200 mL/hr at 07/15/18 1714, 1 g at 07/15/18 1714 .  chlorhexidine gluconate (MEDLINE KIT) (PERIDEX) 0.12 % solution 15 mL, 15 mL, Mouth Rinse, BID, Lanney Gins, Fuad, MD, 15 mL at 07/15/18 0744 .  Chlorhexidine Gluconate Cloth 2 % PADS 6 each, 6 each, Topical, Daily, Darel Hong D, NP .  docusate (COLACE) 50 MG/5ML liquid 100 mg, 100 mg, Per Tube, BID, Awilda Bill, NP, 100 mg at 07/15/18 0905 .  enoxaparin (LOVENOX) injection 40 mg, 40 mg, Subcutaneous, Q12H, Mody, Sital, MD, 40 mg at 07/15/18 0905 .  famotidine (PEPCID) IVPB 20 mg premix, 20 mg, Intravenous, Q24H, Awilda Bill, NP, Stopped at 07/15/18 1507 .  fentaNYL 2520mg in NS 2516m(1074mml) infusion-PREMIX, 0-400 mcg/hr, Intravenous, Continuous, Blakeney, Dana G, NP  .  furosemide (LASIX) injection 80 mg, 80 mg, Intravenous, Q12H, Mody, Sital, MD, 80 mg at 07/15/18 1704 .  insulin aspart (novoLOG) injection 0-15 Units, 0-15 Units, Subcutaneous, Q4H, AleOttie GlazierD, 2 Units at 07/15/18 1542 .  ipratropium-albuterol (DUONEB) 0.5-2.5 (3) MG/3ML nebulizer solution 3 mL, 3 mL, Nebulization, TID, Mody, Sital, MD, 3 mL at 07/15/18 1421 .  levETIRAcetam (KEPPRA) IVPB 500 mg/100 mL premix, 500 mg, Intravenous, Q12H, KeeBradly BienenstockP, Stopped at 07/15/18 1017 .  MEDLINE mouth rinse, 15 mL, Mouth Rinse, 10 times per day, AleOttie GlazierD, 15 mL at 07/15/18 1715 .  midazolam (VERSED) injection 1 mg, 1 mg, Intravenous, Q2H PRN, KeeDarel Hong NP, 1 mg at 07/15/18 1147 .  norepinephrine (LEVOPHED) 4mg61m 250mL87mmix infusion, 0-40 mcg/min, Intravenous, Titrated, Blakeney, Dana Dreama Saa Last Rate: 7.5 mL/hr at 07/15/18 1700, 2 mcg/min at 07/15/18 1700 .  ondansetron (ZOFRAN) tablet 4 mg, 4 mg, Oral, Q6H PRN **OR** ondansetron (ZOFRAN) injection 4 mg, 4 mg, Intravenous, Q6H PRN, Chen,Demetrios Loll.  potassium chloride 10 mEq in 50 mL *CENTRAL LINE* IVPB, 10 mEq, Intravenous, Q1 Hr x 6, Aleskerov, Fuad, MD, Last Rate: 50 mL/hr at 07/15/18 1712, 10 mEq at 07/15/18 1712 .  pravastatin (PRAVACHOL) tablet 20 mg, 20 mg, Per Tube, q1800, BlakeAwilda Bill 20 mg at 07/15/18 1707 .  propofol (DIPRIVAN) 1000 MG/100ML infusion, 0-50 mcg/kg/min, Intravenous, Continuous, Blakeney, Dana Dreama Saa Last Rate: 11.64 mL/hr at 07/15/18 1700, 20 mcg/kg/min at 07/15/18  1700 .  sodium chloride flush (NS) 0.9 % injection 10-40 mL, 10-40 mL, Intracatheter, Q12H, Darel Hong D, NP, 30 mL at 07/15/18 0037 .  sodium chloride flush (NS) 0.9 % injection 10-40 mL, 10-40 mL, Intracatheter, PRN, Darel Hong D, NP .  sodium chloride flush (NS) 0.9 % injection 3 mL, 3 mL, Intravenous, Q12H, Demetrios Loll, MD, 3 mL at 07/14/18 1500 .  sodium chloride flush (NS) 0.9 % injection 3 mL, 3 mL,  Intravenous, PRN, Demetrios Loll, MD .  valproate (DEPACON) 500 mg in dextrose 5 % 50 mL IVPB, 500 mg, Intravenous, Q8H, Leotis Pain, MD, Stopped at 07/15/18 1430 .  vecuronium (NORCURON) injection 10 mg, 10 mg, Intravenous, Q2H PRN, Darel Hong D, NP, 10 mg at 07/15/18 1225     ANALYSIS: A 16 channel recording using standard 10 20 measurements is conducted for 30 minutes.  The background activity is that of theta delta range which gets as high as 6 Hz.  The recording however is replete with generalized high voltage 2 to 4 Hz delta activity oftentimes associated with generalized spike activity.  This is interrupted with frequent episodes of 2 to 3 seconds burst suppression pattern.  No myogenic interference is seen throughout the recording and not much response or variation in the recording.   IMPRESSION This recording is abnormal for the following reasons: 1.  Multiple episodes of generalized spike slow wave epileptiform discharges with maximum activity in the occipital parietal regions bilaterally. 2.  Moderate global slowing indicating a moderate global encephalopathy. 3.  Frequent burst suppression pattern typically seen in medication effect from sedation.      Tiffany Barber A. Merlene Barber, M.D.  Diplomate, Tax adviser of Psychiatry and Neurology ( Neurology).

## 2018-07-15 NOTE — Plan of Care (Addendum)
Shift summary: patient remains on Code Ice, remains intubated and sedated. Neuro assessment is grave (see flowsheet). Replaced electrolytes today as ordered. Managed CBGs/SSI as indicated. Patient receiving PRN doses of Vecuronium for vent compliance amid myclonic activity. Plan: rewarm after 10p tonight, then CT head.    Problem: Education: Goal: Ability to demonstrate management of disease process will improve Outcome: Not Progressing Goal: Ability to verbalize understanding of medication therapies will improve Outcome: Not Progressing Goal: Individualized Educational Video(s) Outcome: Not Progressing   Problem: Activity: Goal: Capacity to carry out activities will improve Outcome: Not Progressing   Problem: Cardiac: Goal: Ability to achieve and maintain adequate cardiopulmonary perfusion will improve Outcome: Not Progressing   Problem: Education: Goal: Knowledge of General Education information will improve Description Including pain rating scale, medication(s)/side effects and non-pharmacologic comfort measures Outcome: Not Progressing

## 2018-07-15 NOTE — Progress Notes (Addendum)
0700 hrs: Report received from Vergia Alberts, RN.   [phone] hrs: Patient's daughter, Adria (?), from [place], updated.  0910 hrs: Patient's family member, Tasia Catchings, updated.   0926 hrs: Initiated CDS referral for evaluation.   0940 hrs: Updated patient's son, Mellody Dance.  0950 hrs: Per CDS, because of patient's age, the patient will only be a donation candidate if formal brain testing is performed.   1550 hrs: Updated patient's son, Mellody Dance.

## 2018-07-16 ENCOUNTER — Inpatient Hospital Stay: Payer: Medicare Other

## 2018-07-16 LAB — BASIC METABOLIC PANEL
Anion gap: 11 (ref 5–15)
Anion gap: 13 (ref 5–15)
BUN: 46 mg/dL — ABNORMAL HIGH (ref 8–23)
BUN: 45 mg/dL — ABNORMAL HIGH (ref 8–23)
CO2: 33 mmol/L — ABNORMAL HIGH (ref 22–32)
CO2: 32 mmol/L (ref 22–32)
Calcium: 8.2 mg/dL — ABNORMAL LOW (ref 8.9–10.3)
Calcium: 8.4 mg/dL — ABNORMAL LOW (ref 8.9–10.3)
Chloride: 93 mmol/L — ABNORMAL LOW (ref 98–111)
Chloride: 95 mmol/L — ABNORMAL LOW (ref 98–111)
Creatinine, Ser: 0.85 mg/dL (ref 0.44–1.00)
Creatinine, Ser: 0.8 mg/dL (ref 0.44–1.00)
GFR calc Af Amer: >60 mL/min (ref >60)
GFR calc Af Amer: >60 mL/min (ref >60)
GFR calc non Af Amer: >60 mL/min (ref >60)
GFR calc non Af Amer: >60 mL/min (ref >60)
Glucose, Bld: 116 mg/dL — ABNORMAL HIGH (ref 70–99)
Glucose, Bld: 122 mg/dL — ABNORMAL HIGH (ref 70–99)
Potassium: 3.3 mmol/L — ABNORMAL LOW (ref 3.5–5.1)
Potassium: 3.1 mmol/L — ABNORMAL LOW (ref 3.5–5.1)
Sodium: 137 mmol/L (ref 135–145)
Sodium: 140 mmol/L (ref 135–145)

## 2018-07-16 LAB — CBC WITH DIFFERENTIAL/PLATELET
Abs Immature Granulocytes: 0.21 10*3/uL — ABNORMAL HIGH (ref 0.00–0.07)
Basophils Absolute: 0 10*3/uL (ref 0.0–0.1)
Basophils Relative: 0 %
Eosinophils Absolute: 0 10*3/uL (ref 0.0–0.5)
Eosinophils Relative: 0 %
HCT: 28.2 % — ABNORMAL LOW (ref 36.0–46.0)
Hemoglobin: 8.7 g/dL — ABNORMAL LOW (ref 12.0–15.0)
Immature Granulocytes: 1 %
Lymphocytes Relative: 4 %
Lymphs Abs: 0.8 10*3/uL (ref 0.7–4.0)
MCH: 30 pg (ref 26.0–34.0)
MCHC: 30.9 g/dL (ref 30.0–36.0)
MCV: 97.2 fL (ref 80.0–100.0)
Monocytes Absolute: 1.4 10*3/uL — ABNORMAL HIGH (ref 0.1–1.0)
Monocytes Relative: 6 %
Neutro Abs: 19.3 10*3/uL — ABNORMAL HIGH (ref 1.7–7.7)
Neutrophils Relative %: 89 %
Platelets: 535 10*3/uL — ABNORMAL HIGH (ref 150–400)
RBC: 2.9 MIL/uL — ABNORMAL LOW (ref 3.87–5.11)
RDW: 14 % (ref 11.5–15.5)
WBC: 21.8 10*3/uL — ABNORMAL HIGH (ref 4.0–10.5)
nRBC: 0 % (ref 0.0–0.2)

## 2018-07-16 LAB — GLUCOSE, CAPILLARY
Glucose-Capillary: 100 mg/dL — ABNORMAL HIGH (ref 70–99)
Glucose-Capillary: 105 mg/dL — ABNORMAL HIGH (ref 70–99)
Glucose-Capillary: 107 mg/dL — ABNORMAL HIGH (ref 70–99)
Glucose-Capillary: 110 mg/dL — ABNORMAL HIGH (ref 70–99)
Glucose-Capillary: 95 mg/dL (ref 70–99)

## 2018-07-16 LAB — TRIGLYCERIDES: Triglycerides: 160 mg/dL — ABNORMAL HIGH (ref <150)

## 2018-07-16 LAB — POTASSIUM: Potassium: 3.2 mmol/L — ABNORMAL LOW (ref 3.5–5.1)

## 2018-07-16 MED ORDER — POTASSIUM CHLORIDE 20 MEQ PO PACK: 20.0000 meq | PACK | Freq: Once | ORAL | Status: DC

## 2018-07-16 MED ORDER — POTASSIUM CHLORIDE 10 MEQ/50ML IV SOLN
10.0000 meq | INTRAVENOUS | Status: AC
  Administered 2018-07-16 (×4): 10 meq via INTRAVENOUS
  Filled 2018-07-16 (×4): qty 50

## 2018-07-16 MED ORDER — POTASSIUM CHLORIDE 10 MEQ/50ML IV SOLN
10.0000 meq | INTRAVENOUS | Status: DC
  Filled 2018-07-16 (×5): qty 50

## 2018-07-16 MED ORDER — POTASSIUM CHLORIDE 10 MEQ/50ML IV SOLN
10.0000 meq | INTRAVENOUS | Status: AC
  Administered 2018-07-16 (×3): 10 meq via INTRAVENOUS
  Filled 2018-07-16 (×4): qty 50

## 2018-07-16 MED ORDER — POTASSIUM CHLORIDE 10 MEQ/50ML IV SOLN
10.0000 meq | INTRAVENOUS | Status: AC
  Administered 2018-07-16 (×2): 10 meq via INTRAVENOUS
  Filled 2018-07-16 (×4): qty 50

## 2018-07-16 NOTE — Progress Notes (Signed)
Pt son at bedside.  He has family coming from out of town tomorrow to see pt and pray and help guide decision making.  Told him tomorrow that 4 can come for a limited visit.    Son is asking if he can talk to an Production designer, theatre/television/film about how long pt can be left on life support.  I explained to him that everyone is different and that no administrator at this point could give him that information

## 2018-07-16 NOTE — Progress Notes (Signed)
[facility] [place] at [facility]   PATIENT NAME: Tiffany Barber    MR#:  [id number]  DATE OF BIRTH:  [birth year]  SUBJECTIVE:  Patient seen and evaluated today Currently on ventilator Ventilator settings Tidal volume 500 Rate 12 PEEP 5 FiO2 30 %  REVIEW OF SYSTEMS:    Unable to obtain as patient is sedated on vent   Tolerating Diet:npo  DRUG ALLERGIES:   Allergies  Allergen Reactions  . Ace Inhibitors Other (See Comments)    Other reaction(s): angioedema resulting in intubation Other reaction(s): angioedema resulting in intubation   . Nifedipine Swelling and Shortness Of Breath  . Lisinopril     VITALS:  Blood pressure 106/68, pulse 85, temperature 97.9 F (36.6 C), resp. rate (!) 22, height 5' (1.524 m), weight 96 kg, SpO2 99 %.  PHYSICAL EXAMINATION:  Constitutional: Intubated and sedated on ventilator  HENT: Normocephalic. . Intubated  eyes: Conjunctivae and EOM are normal. PERRLA, no scleral icterus.  Neck:  No JVD. No tracheal deviation. CVS: RRR, S1/S2 +, no murmurs, no gallops, no carotid bruit.  Pulmonary: Patient with bilateral crackles at bases Abdominal: Soft. BS +,  no distension, tenderness, rebound or guarding.  Musculoskeletal: 1+ lower extremity edema Neuro: on ventilator Skin: Skin is warm and dry. No rash noted. Psychiatric: Sedated  LABORATORY PANEL:   CBC Recent Labs  Lab 07/16/18 0455  WBC 21.8*  HGB 8.7*  HCT 28.2*  PLT 535*   ------------------------------------------------------------------------------------------------------------------  Chemistries  Recent Labs  Lab 07/15/18 0330  07/15/18 2310  07/16/18 0455  NA  --    < >  --    < > 140  K  --    < > 2.9*   < > 3.1*  CL  --    < >  --    < > 95*  CO2  --    < >  --    < > 32  GLUCOSE  --    < >  --    < > 122*  BUN  --    < >  --    < > 45*  CREATININE  --    < >  --    < > 0.80  CALCIUM  --    < >  --    < > 8.4*  MG 2.3   < > 2.8*  --   --    AST 141*  --   --   --   --   ALT 139*  --   --   --   --   ALKPHOS 135*  --   --   --   --   BILITOT 1.2  --   --   --   --    < > = values in this interval not displayed.   ------------------------------------------------------------------------------------------------------------------  Cardiac Enzymes Recent Labs  Lab 07/15/18 0142 07/15/18 0749 07/15/18 1314  TROPONINI 0.63* 0.54* 0.45*   ------------------------------------------------------------------------------------------------------------------  RADIOLOGY:  Ct Head Wo Contrast  Result Date: 07/16/2018 CLINICAL DATA:  Follow-up cardiac arrest.  Treated with hypothermia. EXAM: CT HEAD WITHOUT CONTRAST TECHNIQUE: Contiguous axial images were obtained from the base of the skull through the vertex without intravenous contrast. COMPARISON:  CT 07/14/2018.  MRI 06/26/2009. FINDINGS: Brain: Brainstem and cerebellum appear normal. There is an indistinct appearance of the deep brain structures including the basal ganglia and thalami raising the possibility of hypoxic ischemic injury of the deep brain structures. Chronic small-vessel  ischemic changes again seen within the hemispheric white matter. No evidence of diffuse cortical edema or swelling. No evidence of hemorrhage. No hydrocephalus or extra-axial collection. Vascular: There is atherosclerotic calcification of the major vessels at the base of the brain. Skull: Negative Sinuses/Orbits: Clear/normal Other: None IMPRESSION: Indistinct appearance of the deep brain structures including the basal ganglia and thalami raising the possibility deep brain hypoxic ischemic injury. This is not absolutely definite. No evidence of hemorrhage. No evidence of diffuse cortical swelling or injury. Electronically Signed   By: Paulina FusiMark  Shogry M.D.   On: 07/16/2018 09:00   Ct Head Wo Contrast  Result Date: 07/14/2018 CLINICAL DATA:  Status post resuscitation. EXAM: CT HEAD WITHOUT CONTRAST TECHNIQUE:  Contiguous axial images were obtained from the base of the skull through the vertex without intravenous contrast. COMPARISON:  09/28/2017 FINDINGS: Examination is quite limited due to patient motion despite rescanning attempts. Brain: The ventricles are in the midline without mass effect or shift. No extra-axial fluid collections are identified. Stable periventricular white matter disease and remote left sided basal ganglia infarct. No findings for acute hemispheric infarction or intracranial hemorrhage. No mass lesions. The brainstem and cerebellum are grossly normal. Vascular: Stable vascular calcifications. No definite aneurysm or hyperdense vessels. Skull: No skull fracture or bone lesion. Mild hyperostosis frontalis interna. Sinuses/Orbits: The paranasal sinuses and mastoid air cells are grossly clear. The globes are intact. Other: No scalp hematoma or mass. IMPRESSION: 1. Limited examination. 2. No definite CT findings for acute intracranial process or skull fracture. 3. 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 Electronically Signed   By: Rudie MeyerP.  Gallerani M.D.   On: 07/14/2018 14:28   [place] Venous Img Lower Bilateral  Result Date: 07/16/2018 CLINICAL DATA:  Lower extremity edema EXAM: BILATERAL LOWER EXTREMITY VENOUS DUPLEX ULTRASOUND TECHNIQUE: Doppler venous assessment of the bilateral lower extremity deep venous system was performed, including characterization of spectral flow, compressibility, and phasicity. COMPARISON:  None. FINDINGS: There is complete compressibility of the common femoral, femoral, and popliteal veins. Doppler analysis demonstrates respiratory phasicity and augmentation of flow with calf compression. No obvious superficial vein or calf vein thrombosis. IMPRESSION: No evidence of lower extremity DVT. Electronically Signed   By: Jolaine ClickArthur  Hoss M.D.   On: 07/16/2018 10:35   Dg Chest Port 1 View  Result Date: 07/15/2018 CLINICAL DATA:   Respiratory failure. EXAM: PORTABLE CHEST 1 VIEW COMPARISON:  07/14/2018. FINDINGS: Endotracheal tube and NG tube in stable position. Cardiac pacer stable position. Heart size stable. Bibasilar pulmonary infiltrates/edema. Bibasilar atelectasis. Bilateral pleural effusions. No pneumothorax. IMPRESSION: 1.  Lines and tubes stable position. 2.  Cardiac pacer stable position. 3. Cardiomegaly with bibasilar pulmonary infiltrates/edema and bilateral pleural effusions suggesting CHF. Bibasilar pneumonia cannot be excluded. 4.  Bibasilar atelectasis.  Chest appears similar to prior exam. 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  1.  Acute hypoxic respiratory failure due to acute on chronic diastolic heart failure status post mechanical intubation and status post PEA arrest Continue full vent support Continue vent bundle Management as per intensivist Continue Lasix Levophed to keep map greater than 65  2.  PEA arrest of unclear etiology with concern for possible anoxic brain injury Patient evaluated  by cardiology.. Dual-chamber pacemaker functioning Consider CT chest to rule out PE Head CT yesterday did not show acute pathology Status post neurology evaluation MRI brain to assess for anoxic encephalopathy  3.  Hypokalemia: Replete PRN  4.  Hyponatremia in setting of CHF: Continue to monitor  Management plans discussed with the patient's son yesterday  CODE STATUS: full  TOTAL TIME TAKING CARE OF THIS PATIENT: 36 minutes.   Consider PC consult if no improvement in next few days  POSSIBLE D/C ??, DEPENDING ON CLINICAL CONDITION.   Ihor Austin M.D on 07/16/2018 at 1:57 PM  Between 7am to 6pm - Pager - [phone] After 6pm go to  [email or web address] - password [email or web address]  [facility]  Office  [phone]  CC: Primary care physician; Emi Belfast, FNP  Note: This dictation was prepared with Dragon dictation along with smaller phrase technology. Any transcriptional errors that result from this process are unintentional.

## 2018-07-16 NOTE — Consult Note (Addendum)
PHARMACY CONSULT NOTE - FOLLOW UP  Pharmacy Consult for Electrolyte Monitoring and Replacement   Recent Labs: Potassium (mmol/L)  Date Value  07/16/2018 3.1 (L)  07/07/2013 3.5   Magnesium (mg/dL)  Date Value  61/44/3154 2.8 (H)   Calcium (mg/dL)  Date Value  00/86/7619 8.4 (L)   Calcium, Total (mg/dL)  Date Value  50/93/2671 9.7   Albumin (g/dL)  Date Value  24/58/0998 3.0 (L)  07/06/2013 4.0   Phosphorus (mg/dL)  Date Value  33/82/5053 6.9 (H)   Sodium (mmol/L)  Date Value  07/16/2018 140  11/15/2014 139  07/06/2013 138     Assessment: Patient coded on Jul 26, 2022, now code ice. Rewarming phase planned to start 5/29 afternoon.  Goal of Therapy:  Electrolytes wnl's  Plan:  5/30 @  0455 K 3.1. Will order KCL IV times 4 runs.  Patient is receiving Lasix 80mg  every 12 hours. Rewarming started around 2100 on 5/29.   Will need to be cautious with replacement now that rewarming phase has started.  Will recheck K+ level @ 1400 this after and continue to replace as needed.     Gardner Candle, PharmD, BCPS Clinical Pharmacist 07/16/2018 5:18 AM

## 2018-07-16 NOTE — Consult Note (Signed)
PHARMACY CONSULT NOTE - FOLLOW UP  Pharmacy Consult for Electrolyte Monitoring and Replacement   Recent Labs: Potassium (mmol/L)  Date Value  07/16/2018 3.2 (L)  07/07/2013 3.5   Magnesium (mg/dL)  Date Value  92/02/69 2.8 (H)   Calcium (mg/dL)  Date Value  21/97/5883 8.4 (L)   Calcium, Total (mg/dL)  Date Value  25/49/8264 9.7   Albumin (g/dL)  Date Value  15/83/0940 3.0 (L)  07/06/2013 4.0   Phosphorus (mg/dL)  Date Value  76/80/8811 6.9 (H)   Sodium (mmol/L)  Date Value  07/16/2018 140  11/15/2014 139  07/06/2013 138     Assessment: Patient coded on Jul 23, 2022, now code ice. Rewarming phase planned to start 5/29 afternoon.  Goal of Therapy:  Electrolytes wnl's  Plan:  5/30 @  0455 K 3.1. Will order KCL IV times 4 runs.  Patient is receiving Lasix 80mg  every 12 hours. Rewarming started around 2100 on 5/29.   5/30 @ 1710 K 3.2. Will order KCL 10 mEq x 3 runs. Patient is receiving Lasix 80mg  every 12 hours.  Will need to be cautious with replacement now that rewarming phase has started.  Will recheck K+ level @ 1400 this after and continue to replace as needed.     Katha Cabal, PharmD Clinical Pharmacist 07/16/2018 5:09 PM

## 2018-07-16 NOTE — Consult Note (Addendum)
PHARMACY CONSULT NOTE - FOLLOW UP  Pharmacy Consult for Electrolyte Monitoring and Replacement   Recent Labs: Potassium (mmol/L)  Date Value  07/15/2018 2.9 (L)  07/07/2013 3.5   Magnesium (mg/dL)  Date Value  31/01/1623 2.8 (H)   Calcium (mg/dL)  Date Value  46/95/0722 8.8 (L)   Calcium, Total (mg/dL)  Date Value  57/50/5183 9.7   Albumin (g/dL)  Date Value  35/82/5189 3.0 (L)  07/06/2013 4.0   Phosphorus (mg/dL)  Date Value  84/21/0312 6.9 (H)   Sodium (mmol/L)  Date Value  07/15/2018 136  11/15/2014 139  07/06/2013 138     Assessment: Patient coded on 20-Jul-2022, now code ice. Rewarming phase planned to start 5/29 afternoon.  Goal of Therapy:  Electrolytes wnl's  Plan:  5/29 23:10 K 2.9. Will order KCL IV times 4 runs.  Patient is receiving Lasix 80mg  every 12 hours. Rewarming started around 2100 this evening.   Will need to be cautious with replacement now that rewarming phase has started.  Will recheck electrolytes with AM labs.   Gardner Candle, PharmD, BCPS Clinical Pharmacist 07/16/2018 12:06 AM

## 2018-07-16 NOTE — Progress Notes (Signed)
Ch received a pg regarding pt to be with the family requesting prayer. Ch collaborated w/ pt's nurse who shared that the pt's family are people of great faith and the current prognosis for the pt does not look well for a recovery. Pt's family were at bedside upon ch arrival. Ch facilitated understanding of limitations for the son who was not able to be bedside with the pt during the time when she coded on 22-Jul-2018. Ch shared that she was present during the time she coded and was surprised by the pt's decline. Ch helped the pt's family explore the presence of God but also shared that it was OK to question God in this moment of confusion regarding the unexpected decline of the pt. CH prayed aloud w/ pt's family as they are hoping for a miracle and plan to keep the pt on life support.  F/u should include providing words of encouragement and comfort for family.    07/16/18 1511  Clinical Encounter Type  Visited With Patient and family together;Health care provider  Visit Type Psychological support;Spiritual support;Social support;Patient actively dying  Referral From Nurse  Consult/Referral To Chaplain  Spiritual Encounters  Spiritual Needs Prayer;Emotional;Grief support  Stress Factors  Patient Stress Factors Exhausted;Health changes;Loss of control;Major life changes  Family Stress Factors Major life changes;Loss of control;Loss;Health changes;Family relationships;Exhausted

## 2018-07-16 NOTE — Consult Note (Addendum)
On 07/14/18 s/p PEA cardiac arrest without clear downtime.  Not following commands.   Past Medical History:  Diagnosis Date  . 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 Hyperlipidemia   . Hypertension   . LVH (left ventricular hypertrophy)   . Stroke [facility])     Past Surgical History:  Procedure Laterality Date  . JOINT REPLACEMENT Left 2019   left knee  . PACEMAKER INSERTION N/A 06/30/2018   Procedure: INSERTION PACEMAKER;  Surgeon: Marcina Millard, MD;  Location: [facility] ORS;  Service: Cardiovascular;  Laterality: N/A;  . REPLACEMENT TOTAL KNEE BILATERAL      Family History  Problem Relation Age of Onset  . Hypertension Mother   . Stroke Mother   . Diabetes Daughter   . Hyperlipidemia Daughter     Social History:  reports that she has never smoked. She has never used smokeless tobacco. She reports that she does not drink alcohol or use drugs.  Allergies  Allergen Reactions  . Ace Inhibitors Other (See Comments)    Other reaction(s): angioedema resulting in intubation Other reaction(s): angioedema resulting in intubation   . Nifedipine Swelling and Shortness Of Breath  . Lisinopril     Medications: I have reviewed the patient's current medications.  ROS: Unable to obtain due to sedation   Physical Examination: Blood pressure 118/70, pulse (!) 59, temperature 98.4 F (36.9 C), resp. rate 15, height 5' (1.524 m), weight 96 kg, SpO2 91 %.  Does not follow commands No withdrawal from pain  Minimal cough/gag present off sedation  Laboratory Studies:   Basic Metabolic Panel: Recent Labs  Lab 07/11/18 1019  07/14/18 1506 07/15/18 0142 07/15/18 0330 07/15/18 0749 07/15/18 1314 07/15/18 2310 07/16/18 0242 07/16/18 0455  NA 134*   < > 131* 133*  --   --  136  --  137 140  K 4.8   < > 3.9 2.2*  --   --  2.5* 2.9* 3.3* 3.1*  CL 97*   < > 88* 87*  --   --  89*  --  93* 95*  CO2 27   < > 25 32  --   --  33*  --  33* 32  GLUCOSE 205*   < > 165*  150*  --   --  135*  --  116* 122*  BUN 41*   < > 58* 56*  --   --  50*  --  46* 45*  CREATININE 0.69   < > 1.21* 0.91  --   --  0.84  --  0.85 0.80  CALCIUM 9.3   < > 9.1 8.7*  --   --  8.8*  --  8.2* 8.4*  MG 3.2*  --  2.8*  --  2.3 2.3  --  2.8*  --   --   PHOS  --   --  6.9*  --   --   --   --   --   --   --    < > = values in this interval not displayed.    Liver Function Tests: Recent Labs  Lab 07/11/18 1019 07/14/18 1506 07/15/18 0330  AST 50* 199* 141*  ALT 63* 156* 139*  ALKPHOS 106 151* 135*  BILITOT 0.9 1.0 1.2  PROT 7.3 6.3* 6.1*  ALBUMIN 3.8 3.1* 3.0*   No results for input(s): LIPASE, AMYLASE in the last 168 hours. No results for input(s): AMMONIA in the last 168 hours.  CBC: Recent Labs  Lab 07/11/18 1019 07/12/18 0434 07/14/18 1506 07/15/18 0330 07/16/18 0455  WBC 10.3 10.5 19.3* 21.4* 21.8*  NEUTROABS 8.8*  --  17.2*  --  19.3*  HGB 9.5* 8.8* 9.3* 8.7* 8.7*  HCT 30.4* 27.3* 29.6* 26.4* 28.2*  MCV 100.0 97.5 99.3 93.0 97.2  PLT 433* 409* 481* 495* 535*    Cardiac Enzymes: Recent Labs  Lab 07/14/18 1506 07/14/18 2017 07/15/18 0142 07/15/18 0749 07/15/18 1314  TROPONINI 0.19* 0.42* 0.63* 0.54* 0.45*    BNP: Invalid input(s): POCBNP  CBG: Recent Labs  Lab 07/15/18 1534 07/15/18 1919 07/15/18 2321 07/16/18 0357 07/16/18 0728  GLUCAP 124* 102* 107* 100* 105*    Microbiology: Results for orders placed or performed during the hospital encounter of 07/11/18  SARS Coronavirus 2 (CEPHEID- Performed in [facility] hospital lab), Hosp Order     Status: None   Collection Time: 07/11/18 10:19 AM  Result Value Ref Range Status   SARS Coronavirus 2 NEGATIVE NEGATIVE Final    Comment: (NOTE) If result is NEGATIVE SARS-CoV-2 target nucleic acids are NOT DETECTED. The SARS-CoV-2 RNA is generally detectable in upper and lower  respiratory specimens during the acute phase of infection. The lowest  concentration of SARS-CoV-2 viral copies this  assay can detect is 250  copies / mL. A negative result does not preclude SARS-CoV-2 infection  and should not be used as the sole basis for treatment or other  patient management decisions.  A negative result may occur with  improper specimen collection / handling, submission of specimen other  than nasopharyngeal swab, presence of viral mutation(s) within the  areas targeted by this assay, and inadequate number of viral copies  (<250 copies / mL). A negative result must be combined with clinical  observations, patient history, and epidemiological information. If result is POSITIVE SARS-CoV-2 target nucleic acids are DETECTED. The SARS-CoV-2 RNA is generally detectable in upper and lower  respiratory specimens dur ing the acute phase of infection.  Positive  results are indicative of active infection with SARS-CoV-2.  Clinical  correlation with patient history and other diagnostic information is  necessary to determine patient infection status.  Positive results do  not rule out bacterial infection or co-infection with other viruses. If result is PRESUMPTIVE POSTIVE SARS-CoV-2 nucleic acids MAY BE PRESENT.   A presumptive positive result was obtained on the submitted specimen  and confirmed on repeat testing.  While 2019 novel coronavirus  (SARS-CoV-2) nucleic acids may be present in the submitted sample  additional confirmatory testing may be necessary for epidemiological  and / or clinical management purposes  to differentiate between  SARS-CoV-2 and other Sarbecovirus currently known to infect humans.  If clinically indicated additional testing with an alternate test  methodology [phone]([id number]) is advised. The SARS-CoV-2 RNA is generally  detectable in upper and lower respiratory sp ecimens during the acute  phase of infection. The expected result is Negative. Fact Sheet for Patients:  BoilerBrush.com.cyhttps://www.fda.gov/media/136312/download Fact Sheet for Healthcare  Providers: https://pope.com/https://www.fda.gov/media/136313/download This test is not yet approved or cleared by the [place] FDA and has been authorized for detection and/or diagnosis of SARS-CoV-2 by FDA under an Emergency Use Authorization (EUA).  This EUA will remain in effect (meaning this test can be used) for the duration of the COVID-19 declaration under Section 564(b)(1) of the Act, 21 U.S.C. section 360bbb-3(b)(1), unless the authorization is terminated or revoked sooner. Performed at [facility], [address] [place], [place] [address]   Urine  Culture     Status: Abnormal (Preliminary result)   Collection Time: 07/13/18  2:32 PM  Result Value Ref Range Status   Specimen Description   Final    URINE, CLEAN CATCH Performed at [facility], [address], [place], [place] [address]    Special Requests   Final    NONE Performed at [facility], [address] [place], [place] [address]    Culture >=100,000 COLONIES/mL ESCHERICHIA COLI (A)  Final   Report Status PENDING  Incomplete   Organism ID, Bacteria ESCHERICHIA COLI (A)  Final      Susceptibility   Escherichia coli - MIC*    AMPICILLIN >=32 RESISTANT Resistant     CEFAZOLIN <=4 SENSITIVE Sensitive     CEFTRIAXONE <=1 SENSITIVE Sensitive     CIPROFLOXACIN 1 SENSITIVE Sensitive     GENTAMICIN <=1 SENSITIVE Sensitive     IMIPENEM <=0.25 SENSITIVE Sensitive     NITROFURANTOIN <=16 SENSITIVE Sensitive     TRIMETH/SULFA >=320 RESISTANT Resistant     AMPICILLIN/SULBACTAM 16 INTERMEDIATE Intermediate     PIP/TAZO <=4 SENSITIVE Sensitive     Extended ESBL NEGATIVE Sensitive     * >=100,000 COLONIES/mL ESCHERICHIA COLI    Coagulation Studies: Recent Labs    07/14/18 1505  LABPROT 17.1*  INR 1.4*    Urinalysis:  Recent Labs  Lab 07/13/18 0538  COLORURINE YELLOW*  LABSPEC 1.012  PHURINE 5.0  GLUCOSEU NEGATIVE  HGBUR NEGATIVE  BILIRUBINUR NEGATIVE  KETONESUR NEGATIVE  PROTEINUR 30*   NITRITE NEGATIVE  LEUKOCYTESUR MODERATE*    Lipid Panel:     Component Value Date/Time   CHOL 193 11/15/2014   TRIG 160 (H) 07/16/2018 0455   HDL 59 11/15/2014   LDLCALC 114 11/15/2014    HgbA1C: No results found for: HGBA1C  Urine Drug Screen:  No results found for: LABOPIA, COCAINSCRNUR, LABBENZ, AMPHETMU, THCU, LABBARB  Alcohol Level: No results for input(s): ETH in the last 168 hours.  Other results: EKG: normal EKG, normal sinus rhythm, unchanged from previous tracings.  Imaging: Dg Chest 1 View  Result Date: 07/14/2018 CLINICAL DATA:  Status post intubation and OG tube placement. EXAM: CHEST  1 VIEW COMPARISON:  Single-view of the chest 07/11/2018. FINDINGS: Endotracheal tube is in place with the tip in good position just below the clavicular heads. OG tube tip is in the fundus of the stomach. The patient has small to moderate right pleural effusion and basilar airspace disease. Discoid atelectasis left mid lung and atelectasis in the left lung base noted. No pneumothorax. There is cardiomegaly. IMPRESSION: ET tube and OG tube in good position. Small to moderate right pleural effusion and basilar airspace disease have increased since the prior exam. No change in left basilar atelectasis. Electronically Signed   By: Drusilla Kanner M.D.   On: 07/14/2018 13:54   Dg Abd 1 View  Result Date: 07/14/2018 CLINICAL DATA:  Enteric tube placement. EXAM: ABDOMEN - 1 VIEW COMPARISON:  Abdominal x-ray dated Jun 30, 2010. FINDINGS: Enteric tube in the stomach with the tip in the gastric fundus. Small right pleural effusion. The bowel gas pattern is normal. No radio-opaque calculi or other significant radiographic abnormality are seen. IMPRESSION: 1. Enteric tube in the stomach. 2. Small right pleural effusion. Electronically Signed   By: Obie Dredge M.D.   On: 07/14/2018 13:53   Ct Head Wo Contrast  Result Date: 07/16/2018 CLINICAL DATA:  Follow-up cardiac arrest.  Treated with  hypothermia. EXAM: CT HEAD WITHOUT  CONTRAST TECHNIQUE: Contiguous axial images were obtained from the base of the skull through the vertex without intravenous contrast. COMPARISON:  CT 07/14/2018.  MRI 06/26/2009. FINDINGS: Brain: Brainstem and cerebellum appear normal. There is an indistinct appearance of the deep brain structures including the basal ganglia and thalami raising the possibility of hypoxic ischemic injury of the deep brain structures. Chronic small-vessel ischemic changes again seen within the hemispheric white matter. No evidence of diffuse cortical edema or swelling. No evidence of hemorrhage. No hydrocephalus or extra-axial collection. Vascular: There is atherosclerotic calcification of the major vessels at the base of the brain. Skull: Negative Sinuses/Orbits: Clear/normal Other: None IMPRESSION: Indistinct appearance of the deep brain structures including the basal ganglia and thalami raising the possibility deep brain hypoxic ischemic injury. This is not absolutely definite. No evidence of hemorrhage. No evidence of diffuse cortical swelling or injury. Electronically Signed   By: Paulina Fusi M.D.   On: 07/16/2018 09:00   Ct Head Wo Contrast  Result Date: 07/14/2018 CLINICAL DATA:  Status post resuscitation. EXAM: CT HEAD WITHOUT CONTRAST TECHNIQUE: Contiguous axial images were obtained from the base of the skull through the vertex without intravenous contrast. COMPARISON:  09/28/2017 FINDINGS: Examination is quite limited due to patient motion despite rescanning attempts. Brain: The ventricles are in the midline without mass effect or shift. No extra-axial fluid collections are identified. Stable periventricular white matter disease and remote left sided basal ganglia infarct. No findings for acute hemispheric infarction or intracranial hemorrhage. No mass lesions. The brainstem and cerebellum are grossly normal. Vascular: Stable vascular calcifications. No definite aneurysm or hyperdense  vessels. Skull: No skull fracture or bone lesion. Mild hyperostosis frontalis interna. Sinuses/Orbits: The paranasal sinuses and mastoid air cells are grossly clear. The globes are intact. Other: No scalp hematoma or mass. IMPRESSION: 1. Limited examination. 2. No definite CT findings for acute intracranial process or skull fracture. 3. 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 Electronically Signed   By: Rudie Meyer M.D.   On: 07/14/2018 14:28   US Venous Img Lower Bilateral  Result Date: 07/16/2018 CLINICAL DATA:  Lower extremity edema EXAM: BILATERAL LOWER EXTREMITY VENOUS DUPLEX ULTRASOUND TECHNIQUE: Doppler venous assessment of the bilateral lower extremity deep venous system was performed, including characterization of spectral flow, compressibility, and phasicity. COMPARISON:  None. FINDINGS: There is complete compressibility of the common femoral, femoral, and popliteal veins. Doppler analysis demonstrates respiratory phasicity and augmentation of flow with calf compression. No obvious superficial vein or calf vein thrombosis. IMPRESSION: No evidence of lower extremity DVT. Electronically Signed   By: Jolaine Click M.D.   On: 07/16/2018 10:35   Dg Chest Port 1 View  Result Date: 07/15/2018 CLINICAL DATA:  Respiratory failure. EXAM: PORTABLE CHEST 1 VIEW COMPARISON:  07/14/2018. FINDINGS: Endotracheal tube and NG tube in stable position. Cardiac pacer stable position. Heart size stable. Bibasilar pulmonary infiltrates/edema. Bibasilar atelectasis. Bilateral pleural effusions. No pneumothorax. IMPRESSION: 1.  Lines and tubes stable position. 2.  Cardiac pacer stable position. 3. Cardiomegaly with bibasilar pulmonary infiltrates/edema and bilateral pleural effusions suggesting CHF. Bibasilar pneumonia cannot be excluded. 4.  Bibasilar atelectasis.  Chest appears similar to prior exam. Electronically Signed   By: Maisie Fus  Register   On: 07/15/2018  06:16     Assessment/Plan:  78 y.o. female  hypertension, hyperlipidemia, left ventricular hypertrophy, stroke and recent heart block with pacemaker placement.  The patient presents with worsening shortness of breath for the past 5 day seen  on 07/11/2018.   She feels chest tightness, cough, orthopnea and nocturnal dyspnea.   On 07/14/18 s/p PEA cardiac arrest without clear downtime.   - CTH with early signs of anoxia - not brain dead as weak cough and gag - on VPA 500 TID for myoclonus which resolved - MRI brain today to help with prognostication  - likely poor prognosis given early signs of anoxia on CTH and EEG with burst suppression.    Pauletta Browns  07/16/2018, 11:14 AM    Addendum: Unable to obtain MRI due to PPM. Limit any sedation to see if any improvement in exam.

## 2018-07-16 NOTE — Plan of Care (Signed)
Pt is hemodynamically stable at this time.  On vent.  Unresponsive.  Continue full medical care and full code

## 2018-07-16 NOTE — Procedures (Addendum)
  [facility] Evan Osburn A. Burma Ketcher, MD     [email or web address]         CORRECTION DUPLICATE REPORT  SEE EEG REPORT FROM 07-15-2018     Jacques Fife A. Tikita Mabee, M.D.  Diplomate, American Board of Psychiatry and Neurology ( Neurology).  

## 2018-07-16 NOTE — Progress Notes (Signed)
CRITICAL CARE NOTE     SUBJECTIVE FINDINGS & SIGNIFICANT EVENTS   Patient remains critically ill Discussed repeat CTH with neurologist- appreciate input - CT head consistent with anoxic brain injury with very poor prognosis.   Discussed with Son Lanny Hurst - he understands that patient has very poor prognosis and I have made arrangements for family to come see patient at bedside and discuss goals of care   PAST MEDICAL HISTORY   Past Medical History:  Diagnosis Date  . 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 Hyperlipidemia   . Hypertension   . LVH (left ventricular hypertrophy)   . Stroke [facility])      SURGICAL HISTORY   Past Surgical History:  Procedure Laterality Date  . JOINT REPLACEMENT Left 2019   left knee  . PACEMAKER INSERTION N/A 06/30/2018   Procedure: INSERTION PACEMAKER;  Surgeon: Isaias Cowman, MD;  Location: [facility] ORS;  Service: Cardiovascular;  Laterality: N/A;  . REPLACEMENT TOTAL KNEE BILATERAL       FAMILY HISTORY   Family History  Problem Relation Age of Onset  . Hypertension Mother   . Stroke Mother   . Diabetes Daughter   . Hyperlipidemia Daughter      SOCIAL HISTORY   Social History   Tobacco Use  . Smoking status: Never Smoker  . Smokeless tobacco: Never Used  Substance Use Topics  . Alcohol use: No  . Drug use: No     MEDICATIONS   Current Medication:  Current Facility-Administered Medications:  .  0.9 %  sodium chloride infusion, 250 mL, Intravenous, PRN, Demetrios Loll, MD, Stopped at 07/15/18 2143 .  acetaminophen (TYLENOL) tablet 650 mg, 650 mg, Oral, Q6H PRN **OR** acetaminophen (TYLENOL) suppository 650 mg, 650 mg, Rectal, Q6H PRN, Demetrios Loll, MD .  albuterol (PROVENTIL) (2.5 MG/3ML) 0.083% nebulizer solution 2.5 mg, 2.5 mg, Nebulization,  Q2H PRN, Demetrios Loll, MD, 2.5 mg at 07/14/18 0444 .  aspirin chewable tablet 81 mg, 81 mg, Per Tube, Daily, Awilda Bill, NP, 81 mg at 07/15/18 0905 .  bisacodyl (DULCOLAX) suppository 10 mg, 10 mg, Rectal, Daily PRN, Awilda Bill, NP .  cefTRIAXone (ROCEPHIN) 1 g in sodium chloride 0.9 % 100 mL IVPB, 1 g, Intravenous, q1800, Awilda Bill, NP, Stopped at 07/15/18 1744 .  chlorhexidine gluconate (MEDLINE KIT) (PERIDEX) 0.12 % solution 15 mL, 15 mL, Mouth Rinse, BID, Lanney Gins, Joshus Rogan, MD, 15 mL at 07/15/18 1921 .  Chlorhexidine Gluconate Cloth 2 % PADS 6 each, 6 each, Topical, Daily, Darel Hong D, NP .  docusate (COLACE) 50 MG/5ML liquid 100 mg, 100 mg, Per Tube, BID, Awilda Bill, NP, 100 mg at 07/15/18 2100 .  enoxaparin (LOVENOX) injection 40 mg, 40 mg, Subcutaneous, Q12H, Mody, Sital, MD, 40 mg at 07/15/18 2100 .  famotidine (PEPCID) IVPB 20 mg premix, 20 mg, Intravenous, Q24H, Awilda Bill, NP, Stopped at 07/15/18 1507 .  fentaNYL 2585mg in NS 2559m(1021mml) infusion-PREMIX, 0-400 mcg/hr, Intravenous, Continuous, Blakeney, Dana G, NP .  furosemide (LASIX) injection 80 mg, 80 mg, Intravenous, Q12H, Mody, Sital, MD, 80 mg at 07/16/18 0500 .  insulin aspart (novoLOG) injection 0-15 Units, 0-15 Units, Subcutaneous, Q4H, AleOttie GlazierD, 2 Units at 07/15/18 1542 .  ipratropium-albuterol (DUONEB) 0.5-2.5 (3) MG/3ML nebulizer solution 3 mL, 3 mL, Nebulization, TID, Mody, Sital, MD, 3 mL at 07/15/18 1952 .  levETIRAcetam (KEPPRA) IVPB 500 mg/100 mL premix, 500 mg, Intravenous, Q12H, KeeBradly BienenstockP,  Last Rate: 400 mL/hr at 07/15/18 2139 .  MEDLINE mouth rinse, 15 mL, Mouth Rinse, 10 times per day, Lanney Gins, Leigha Olberding, MD, 15 mL at 07/16/18 0500 .  midazolam (VERSED) injection 1 mg, 1 mg, Intravenous, Q2H PRN, Darel Hong D, NP, 1 mg at 07/15/18 1147 .  norepinephrine (LEVOPHED) 77m in 2527mpremix infusion, 0-40 mcg/min, Intravenous, Titrated, Blakeney, DaDreama SaaNP, Last  Rate: 11.25 mL/hr at 07/16/18 0600, 3 mcg/min at 07/16/18 0600 .  ondansetron (ZOFRAN) tablet 4 mg, 4 mg, Oral, Q6H PRN **OR** ondansetron (ZOFRAN) injection 4 mg, 4 mg, Intravenous, Q6H PRN, ChDemetrios LollMD .  potassium chloride 10 mEq in 50 mL *CENTRAL LINE* IVPB, 10 mEq, Intravenous, Q1 Hr x 4, Hallaji, Sheema M, RPH, Last Rate: 50 mL/hr at 07/16/18 0623, 10 mEq at 07/16/18 [id number]  pravastatin (PRAVACHOL) tablet 20 mg, 20 mg, Per Tube, q1800, BlAwilda BillNP, 20 mg at 07/15/18 1707 .  propofol (DIPRIVAN) 1000 MG/100ML infusion, 0-50 mcg/kg/min, Intravenous, Continuous, Blakeney, DaDreama SaaNP, Last Rate: 5.82 mL/hr at 07/16/18 0600, 10 mcg/kg/min at 07/16/18 0600 .  sodium chloride flush (NS) 0.9 % injection 10-40 mL, 10-40 mL, Intracatheter, Q12H, KeDarel Hong, NP, 10 mL at 07/15/18 2104 .  sodium chloride flush (NS) 0.9 % injection 10-40 mL, 10-40 mL, Intracatheter, PRN, KeDarel Hong, NP .  sodium chloride flush (NS) 0.9 % injection 3 mL, 3 mL, Intravenous, Q12H, ChDemetrios LollMD, 3 mL at 07/15/18 2105 .  sodium chloride flush (NS) 0.9 % injection 3 mL, 3 mL, Intravenous, PRN, ChDemetrios LollMD .  valproate (DEPACON) 500 mg in dextrose 5 % 50 mL IVPB, 500 mg, Intravenous, Q8H, ZeLeotis PainMD, Stopped at 07/16/18 05423 [phone]  vecuronium (NORCURON) injection 10 mg, 10 mg, Intravenous, Q2H PRN, KeDarel Hong, NP, 10 mg at 07/15/18 1819    ALLERGIES   Ace inhibitors; Nifedipine; and Lisinopril    REVIEW OF SYSTEMS     Unable to obtain due to unresponsive state  PHYSICAL EXAMINATION   Vitals:   07/16/18 0600 07/16/18 0615  BP: (!) 123/58 (!) 115/59  Pulse: 70 60  Resp: 14 14  Temp:    SpO2: 98% 99%    GENERAL:GCS3T HEAD: Normocephalic, atraumatic.  EYES: Pupils equal, round, reactive to light.  No scleral icterus.  MOUTH: Moist mucosal membrane. NECK: Supple. No thyromegaly. No nodules. No JVD.  PULMONARY: mild bibasilar crackles CARDIOVASCULAR: S1 and S2.  Regular rate and rhythm. No murmurs, rubs, or gallops.  GASTROINTESTINAL: Soft, nontender, non-distended. No masses. Positive bowel sounds. No hepatosplenomegaly.  MUSCULOSKELETAL: No swelling, clubbing, or edema.  NEUROLOGIC: Mild distress due to acute illness SKIN:intact,warm,dry   LABS AND IMAGING     LAB RESULTS: Recent Labs  Lab 07/15/18 1314 07/15/18 2310 07/16/18 0242 07/16/18 0455  NA 136  --  137 140  K 2.5* 2.9* 3.3* 3.1*  CL 89*  --  93* 95*  CO2 33*  --  33* 32  BUN 50*  --  46* 45*  CREATININE 0.84  --  0.85 0.80  GLUCOSE 135*  --  116* 122*   Recent Labs  Lab 07/14/18 1506 07/15/18 0330 07/16/18 0455  HGB 9.3* 8.7* 8.7*  HCT 29.6* 26.4* 28.2*  WBC 19.3* 21.4* 21.8*  PLT 481* 495* 535*     IMAGING RESULTS: No results found.    ASSESSMENT AND PLAN    -Multidisciplinary rounds held today   CARDIAC FAILURE-status post PEA arrest with ACLS and ROSC -  poor prognosis - has not had any improvement in neuro status -follow up cardiac enzymes as indicated -CTH repeat - cosnsistent with anoxic brain injury - neuro oncase- appreciate input ICU monitoring    Acutely comatose - likely due to above with resultant anoxic brain injury -Neurology consultation-appreciate input -That is post EEG and repeat CT head   GI/Nutrition GI PROPHYLAXIS as indicated DIET-->TF's as tolerated Constipation protocol as indicated  ENDO - ICU hypoglycemic\Hyperglycemia protocol -check FSBS per protocol   ELECTROLYTES -follow labs as needed -replace as needed -pharmacy consultation   DVT/GI PRX ordered -SCDs  TRANSFUSIONS AS NEEDED MONITOR FSBS ASSESS the need for LABS as needed   Critical care provider statement:   Critical care time (minutes): 108  Critical care time was exclusive of: Separately billable procedures and treating other patients  Critical care was necessary to treat or prevent imminent or life-threatening deterioration  of the following conditions:  Cardiac arrest, unresponsive mental state, acutely comatose, systolic CHF, multiple comorbid condition  Critical care was time spent personally by me on the following activities: Development of treatment plan with patient or surrogate, discussions with consultants, evaluation of patient's response to treatment, examination of patient, obtaining history from patient or surrogate, ordering and performing treatments and interventions, ordering and review of laboratory studies and re-evaluation of patient's condition.  I assumed direction of critical care for this patient from another provider in my specialty: no   This document was prepared using Dragon voice recognition software and may include unintentional dictation errors.     Ottie Glazier, M.D.  Division of [place]

## 2018-07-16 NOTE — Progress Notes (Signed)
RT assisted with patient transport to and from CT with no complications. 

## 2018-07-16 NOTE — Progress Notes (Signed)
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  Now status post PEA arrest.  Pacemaker is AV pacing.  Neurologic status appears unchanged.  Head CT done today reveals indistinct appearance of deep brain structures including basal ganglia and thalami thalami raising possibility of deep brain hypoxic ischemic injury.  No definite focal abnormality.  No diffuse focal swelling.  No evidence of lower extremity DVT.  Brain MRI pending.  Subjective   Unresponsive  Inpatient Medications    . aspirin  81 mg Per Tube Daily  . chlorhexidine gluconate (MEDLINE KIT)  15 mL Mouth Rinse BID  . Chlorhexidine Gluconate Cloth  6 each Topical Daily  . docusate  100 mg Per Tube BID  . enoxaparin (LOVENOX) injection  40 mg Subcutaneous Q12H  . furosemide  80 mg Intravenous Q12H  . insulin aspart  0-15 Units Subcutaneous Q4H  . ipratropium-albuterol  3 mL Nebulization TID  . mouth rinse  15 mL Mouth Rinse 10 times per day  . pravastatin  20 mg Per Tube q1800  . sodium chloride flush  10-40 mL Intracatheter Q12H  . sodium chloride flush  3 mL Intravenous Q12H    Vital Signs    Vitals:   07/16/18 1045 07/16/18 1100 07/16/18 1115 07/16/18 1130  BP: (!) 137/96 128/80 93/62 106/68  Pulse: 78 62 88 85  Resp: 17 (!) 22 (!) 21 (!) 22  Temp: 98.2 F (36.8 C) 98.2 F (36.8 C) 98.1 F (36.7 C) 97.9 F (36.6 C)  TempSrc:      SpO2: 98% 96% 96% 96%  Weight:      Height:        Intake/Output Summary (Last 24 hours) at 07/16/2018 1233 Last data filed at 07/16/2018 0900 Gross per 24 hour  Intake 1487.81 ml  Output 2290 ml  Net -802.19 ml   Filed Weights   07/14/18 0342 07/15/18 0440 07/16/18 0458  Weight: 97 kg 97.3 kg 96 kg    Physical Exam    GEN:  Well nourished, well developed, in no acute distress.  HEENT: normal.  Neck: Supple, no JVD, carotid bruits, or masses. Cardiac: RRR, no murmurs, rubs, or gallops. No clubbing, cyanosis, edema.  Radials/DP/PT 2+ and equal bilaterally.  Respiratory:  Respirations regular and unlabored, clear to auscultation bilaterally. GI: Soft, nontender, nondistended, BS + x 4. MS: no deformity or atrophy. Skin: warm and dry, no rash. Neuro:  Strength and sensation are intact. Psych: Normal affect.  Labs    CBC Recent Labs    07/14/18 1506 07/15/18 0330 07/16/18 0455  WBC 19.3* 21.4* 21.8*  NEUTROABS 17.2*  --  19.3*  HGB 9.3* 8.7* 8.7*  HCT 29.6* 26.4* 28.2*  MCV 99.3 93.0 97.2  PLT 481* 495* 562*   Basic Metabolic Panel Recent Labs    07/14/18 1506  07/15/18 0749  07/15/18 2310 07/16/18 0242 07/16/18 0455  NA 131*   < >  --    < >  --  137 140  K 3.9   < >  --    < > 2.9* 3.3* 3.1*  CL 88*   < >  --    < >  --  93* 95*  CO2 25   < >  --    < >  --  33* 32  GLUCOSE 165*   < >  --    < >  --  116* 122*  BUN 58*   < >  --    < >  --  46* 45*  CREATININE 1.21*   < >  --    < >  --  0.85 0.80  CALCIUM 9.1   < >  --    < >  --  8.2* 8.4*  MG 2.8*   < > 2.3  --  2.8*  --   --   PHOS 6.9*  --   --   --   --   --   --    < > = values in this interval not displayed.   Liver Function Tests Recent Labs    07/14/18 1506 07/15/18 0330  AST 199* 141*  ALT 156* 139*  ALKPHOS 151* 135*  BILITOT 1.0 1.2  PROT 6.3* 6.1*  ALBUMIN 3.1* 3.0*   No results for input(s): LIPASE, AMYLASE in the last 72 hours. Cardiac Enzymes Recent Labs    07/15/18 0142 07/15/18 0749 07/15/18 1314  TROPONINI 0.63* 0.54* 0.45*   BNP Recent Labs    07/14/18 1506 07/15/18 0431  BNP 250.0* 537.0*   D-Dimer No results for input(s): DDIMER in the last 72 hours. Hemoglobin A1C No results for input(s): HGBA1C in the last 72 hours. Fasting Lipid Panel Recent Labs    07/16/18 0455  TRIG 160*    Thyroid Function Tests No results for input(s): TSH, T4TOTAL, T3FREE, THYROIDAB in the last 72 hours.  Invalid input(s): FREET3  Telemetry    AV pacing  ECG    AV pacing  Radiology    Dg Chest 1 View  Result Date: 07/14/2018 CLINICAL DATA:  Status post intubation and OG tube placement. EXAM: CHEST  1 VIEW COMPARISON:  Single-view of the chest 07/11/2018. FINDINGS: Endotracheal tube is in place with the tip in good position just below the clavicular heads. OG tube tip is in the fundus of the stomach. The patient has small to moderate right pleural effusion and basilar airspace disease. Discoid atelectasis left mid lung and atelectasis in the left lung base noted. No pneumothorax. There is cardiomegaly. IMPRESSION: ET tube and OG tube in good position. Small to moderate right pleural effusion and basilar airspace disease have increased since the prior exam. No change in left basilar atelectasis. Electronically Signed   By: Inge Rise M.D.   On: 07/14/2018 13:54   Dg Abd 1 View  Result Date: 07/14/2018 CLINICAL DATA:  Enteric tube placement. EXAM: ABDOMEN - 1 VIEW COMPARISON:  Abdominal x-ray dated Jun 30, 2010. FINDINGS: Enteric tube in the stomach with the tip in the gastric fundus. Small right pleural effusion. The bowel gas pattern is normal. No radio-opaque calculi or other significant radiographic abnormality are seen. IMPRESSION: 1. Enteric tube in the stomach. 2. Small right pleural effusion. Electronically Signed   By: Titus Dubin M.D.   On: 07/14/2018 13:53   Ct Head Wo Contrast  Result Date: 07/16/2018 CLINICAL DATA:  Follow-up cardiac arrest.  Treated with hypothermia. EXAM: CT HEAD WITHOUT CONTRAST TECHNIQUE: Contiguous axial images were obtained from the base of the skull through the vertex without intravenous contrast. COMPARISON:  CT 07/14/2018.  MRI 06/26/2009. FINDINGS: Brain: Brainstem and cerebellum appear normal. There is an indistinct appearance of the deep  brain structures including the basal ganglia and thalami raising the possibility of hypoxic ischemic injury of the deep brain structures.  Chronic small-vessel ischemic changes again seen within the hemispheric white matter. No evidence of diffuse cortical edema or swelling. No evidence of hemorrhage. No hydrocephalus or extra-axial collection. Vascular: There is atherosclerotic calcification of the major vessels at the base of the brain. Skull: Negative Sinuses/Orbits: Clear/normal Other: None IMPRESSION: Indistinct appearance of the deep brain structures including the basal ganglia and thalami raising the possibility deep brain hypoxic ischemic injury. This is not absolutely definite. No evidence of hemorrhage. No evidence of diffuse cortical swelling or injury. Electronically Signed   By: Nelson Chimes M.D.   On: 07/16/2018 09:00   Ct Head Wo Contrast  Result Date: 07/14/2018 CLINICAL DATA:  Status post resuscitation. EXAM: CT HEAD WITHOUT CONTRAST TECHNIQUE: Contiguous axial images were obtained from the base of the skull through the vertex without intravenous contrast. COMPARISON:  09/28/2017 FINDINGS: Examination is quite limited due to patient motion despite rescanning attempts. Brain: The ventricles are in the midline without mass effect or shift. No extra-axial fluid collections are identified. Stable periventricular white matter disease and remote left sided basal ganglia infarct. No findings for acute hemispheric infarction or intracranial hemorrhage. No mass lesions. The brainstem and cerebellum are grossly normal. Vascular: Stable vascular calcifications. No definite aneurysm or hyperdense vessels. Skull: No skull fracture or bone lesion. Mild hyperostosis frontalis interna. Sinuses/Orbits: The paranasal sinuses and mastoid air cells are grossly clear. The globes are intact. Other: No scalp hematoma or mass. IMPRESSION: 1. Limited examination. 2. No definite CT findings for acute intracranial process  or skull fracture. 3. 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 Electronically Signed   By: Marijo Sanes M.D.   On: 07/14/2018 14:28   US Venous Img Lower Bilateral  Result Date: 07/16/2018 CLINICAL DATA:  Lower extremity edema EXAM: BILATERAL LOWER EXTREMITY VENOUS DUPLEX ULTRASOUND TECHNIQUE: Doppler venous assessment of the bilateral lower extremity deep venous system was performed, including characterization of spectral flow, compressibility, and phasicity. COMPARISON:  None. FINDINGS: There is complete compressibility of the common femoral, femoral, and popliteal veins. Doppler analysis demonstrates respiratory phasicity and augmentation of flow with calf compression. No obvious superficial vein or calf vein thrombosis. IMPRESSION: No evidence of lower extremity DVT. Electronically Signed   By: Marybelle Killings M.D.   On: 07/16/2018 10:35   Dg Chest Port 1 View  Result Date: 07/15/2018 CLINICAL DATA:  Respiratory failure. EXAM: PORTABLE CHEST 1 VIEW COMPARISON:  07/14/2018. FINDINGS: Endotracheal tube and NG tube in stable position. Cardiac pacer stable position. Heart size stable. Bibasilar pulmonary infiltrates/edema. Bibasilar atelectasis. Bilateral pleural effusions. No pneumothorax. IMPRESSION: 1.  Lines and tubes stable position. 2.  Cardiac pacer stable position. 3. Cardiomegaly with bibasilar pulmonary infiltrates/edema and bilateral pleural effusions suggesting CHF. Bibasilar pneumonia cannot be excluded. 4.  Bibasilar atelectasis.  Chest appears similar to prior exam. Electronically Signed   By: Marcello Moores  Register   On: 07/15/2018 06:16   Dg Chest Port 1 View  Result Date: 07/11/2018 CLINICAL DATA:  Shortness of breath EXAM: PORTABLE CHEST 1 VIEW COMPARISON:  Jul 11, 2018 study obtained earlier in the day FINDINGS: Cardiomegaly is stable. Pacemaker leads are attached to the right atrium and right ventricle. There is atelectatic change in the  mid lung regions bilaterally. There is no frank edema or consolidation. No adenopathy evident. No bone lesions. IMPRESSION: Stable cardiomegaly with pacemaker lead positions unchanged. Atelectatic change in each mid lung. No edema or consolidation evident. Electronically Signed   By: Lowella Grip III  M.D.   On: 07/11/2018 23:49   Dg Chest Portable 1 View  Result Date: 07/11/2018 CLINICAL DATA:  Shortness of breath EXAM: PORTABLE CHEST 1 VIEW COMPARISON:  06/29/2018, 06/30/2018 FINDINGS: There is mild bilateral interstitial prominence. There are low lung volumes likely partially accounting for the interstitial prominence. There is no focal consolidation, pleural effusion or pneumothorax. There is stable cardiomegaly. There is a dual lead cardiac pacemaker. The osseous structures are unremarkable. IMPRESSION: 1. Cardiomegaly with mild pulmonary vascular congestion. Interstitial markings are accentuated by low lung volumes. Electronically Signed   By: Kathreen Devoid   On: 07/11/2018 11:01   Dg Chest Port 1 View  Result Date: 06/30/2018 CLINICAL DATA:  Postop pacemaker insertion. EXAM: PORTABLE CHEST 1 VIEW COMPARISON:  06/29/2018 FINDINGS: The patient is status post placement of a dual chamber left-sided pacemaker. Of or is no evidence of a pneumothorax. The cardiac silhouette remains enlarged. There are prominent interstitial lung markings bilaterally consistent with pulmonary edema. The lung volumes are low. Bibasilar atelectasis is noted. There is no acute osseous abnormality. IMPRESSION: 1. Status post placement of a dual chamber left-sided pacemaker with no evidence of a pneumothorax. 2. Cardiomegaly with findings of pulmonary edema. Electronically Signed   By: Constance Holster M.D.   On: 06/30/2018 14:49   Dg Chest Portable 1 View  Result Date: 06/29/2018 CLINICAL DATA:  Cough.  Low oxygen saturation. EXAM: PORTABLE CHEST 1 VIEW COMPARISON:  09/25/2017 FINDINGS: Heart is enlarged. The aorta is  tortuous. There is newly seen widespread bilateral pulmonary density. The pattern is nonspecific and could represent pneumonia or heart failure. Possible small amount of pleural effusion on each side. No significant bone finding. IMPRESSION: Cardiomegaly. Widespread abnormal lung density. The differential diagnosis is heart failure versus pneumonia. Electronically Signed   By: Nelson Chimes M.D.   On: 06/29/2018 17:03   Dg C-arm 1-60 Min-no Report  Result Date: 06/30/2018 Fluoroscopy was utilized by the requesting physician.  No radiographic interpretation.    Assessment & Plan    78 year old female with hypertension hyperlipidemia status post recent permanent pacemaker for high-grade heart block.  Currently status post PEA arrest.  No unclear downtime.  Neurologic status is somewhat poor.  Appreciate neurology input.  Appears stable from a cardiac standpoint will continue to follow as needed.  Signed, Javier Docker Karl Knarr MD 07/16/2018, 12:33 PM  Pager: [phone]

## 2018-07-17 LAB — BASIC METABOLIC PANEL
Anion gap: 14 (ref 5–15)
BUN: 44 mg/dL — ABNORMAL HIGH (ref 8–23)
CO2: 33 mmol/L — ABNORMAL HIGH (ref 22–32)
Calcium: 8.5 mg/dL — ABNORMAL LOW (ref 8.9–10.3)
Chloride: 98 mmol/L (ref 98–111)
Creatinine, Ser: 0.76 mg/dL (ref 0.44–1.00)
GFR calc Af Amer: >60 mL/min (ref >60)
GFR calc non Af Amer: >60 mL/min (ref >60)
Glucose, Bld: 108 mg/dL — ABNORMAL HIGH (ref 70–99)
Potassium: 3.1 mmol/L — ABNORMAL LOW (ref 3.5–5.1)
Sodium: 145 mmol/L (ref 135–145)

## 2018-07-17 LAB — CBC
HCT: 27.6 % — ABNORMAL LOW (ref 36.0–46.0)
Hemoglobin: 8.6 g/dL — ABNORMAL LOW (ref 12.0–15.0)
MCH: 30.4 pg (ref 26.0–34.0)
MCHC: 31.2 g/dL (ref 30.0–36.0)
MCV: 97.5 fL (ref 80.0–100.0)
Platelets: 441 10*3/uL — ABNORMAL HIGH (ref 150–400)
RBC: 2.83 MIL/uL — ABNORMAL LOW (ref 3.87–5.11)
RDW: 14.2 % (ref 11.5–15.5)
WBC: 16.9 10*3/uL — ABNORMAL HIGH (ref 4.0–10.5)
nRBC: 0 % (ref 0.0–0.2)

## 2018-07-17 LAB — POTASSIUM: Potassium: 2.9 mmol/L — ABNORMAL LOW (ref 3.5–5.1)

## 2018-07-17 LAB — GLUCOSE, CAPILLARY
Glucose-Capillary: 105 mg/dL — ABNORMAL HIGH (ref 70–99)
Glucose-Capillary: 110 mg/dL — ABNORMAL HIGH (ref 70–99)
Glucose-Capillary: 86 mg/dL (ref 70–99)
Glucose-Capillary: 95 mg/dL (ref 70–99)
Glucose-Capillary: 97 mg/dL (ref 70–99)
Glucose-Capillary: 99 mg/dL (ref 70–99)

## 2018-07-17 LAB — MAGNESIUM: Magnesium: 2.8 mg/dL — ABNORMAL HIGH (ref 1.7–2.4)

## 2018-07-17 LAB — PHOSPHORUS: Phosphorus: 3.4 mg/dL (ref 2.5–4.6)

## 2018-07-17 MED ORDER — POTASSIUM CHLORIDE 10 MEQ/50ML IV SOLN
10.0000 meq | INTRAVENOUS | Status: AC
  Administered 2018-07-17 – 2018-07-18 (×5): 10 meq via INTRAVENOUS
  Filled 2018-07-17 (×5): qty 50

## 2018-07-17 MED ORDER — CEFAZOLIN SODIUM-DEXTROSE 1-4 GM/50ML-% IV SOLN
1.0000 g | Freq: Three times a day (TID) | INTRAVENOUS | Status: DC
  Administered 2018-07-17 – 2018-07-20 (×10): 1 g via INTRAVENOUS
  Filled 2018-07-17 (×11): qty 50

## 2018-07-17 MED ORDER — POTASSIUM CHLORIDE 10 MEQ/50ML IV SOLN
10.0000 meq | INTRAVENOUS | Status: AC
  Administered 2018-07-17 (×5): 10 meq via INTRAVENOUS
  Filled 2018-07-17 (×5): qty 50

## 2018-07-17 MED ORDER — SODIUM CHLORIDE 0.9 % IV SOLN
500.0000 mg | Freq: Two times a day (BID) | INTRAVENOUS | Status: DC
  Administered 2018-07-17 – 2018-07-18 (×3): 500 mg via INTRAVENOUS
  Filled 2018-07-17 (×2): qty 5
  Filled 2018-07-17 (×3): qty 500

## 2018-07-17 MED ORDER — FAMOTIDINE 20 MG PO TABS
20.0000 mg | ORAL_TABLET | Freq: Every day | ORAL | Status: DC
  Administered 2018-07-17 – 2018-07-18 (×2): 20 mg via ORAL
  Filled 2018-07-17 (×2): qty 1

## 2018-07-17 NOTE — Consult Note (Signed)
PHARMACY CONSULT NOTE - FOLLOW UP  Pharmacy Consult for Electrolyte Monitoring and Replacement   Recent Labs: Potassium (mmol/L)  Date Value  07/17/2018 2.9 (L)  07/07/2013 3.5   Magnesium (mg/dL)  Date Value  92/33/0076 2.8 (H)   Calcium (mg/dL)  Date Value  22/63/3354 8.5 (L)   Calcium, Total (mg/dL)  Date Value  56/25/6389 9.7   Albumin (g/dL)  Date Value  37/34/2876 3.0 (L)  07/06/2013 4.0   Phosphorus (mg/dL)  Date Value  81/15/7262 3.4   Sodium (mmol/L)  Date Value  07/17/2018 145  11/15/2014 139  07/06/2013 138     Assessment: Patient coded on 2022/07/30, now code ice. Rewarming phase planned to start 5/29 afternoon.  Goal of Therapy:  Electrolytes wnl's  Plan:  5/31 AM: K 3.1. Will order KCL 10 mEq IV x 5 doses.  5/31 PM K 2.9. Will order KCl 10 mEq IV x 5 doses.   Recheck Mg, Phos, and K+ with AM labs and continue to replace as needed.   Katha Cabal, PharmD Clinical Pharmacist 07/17/2018 7:27 PM

## 2018-07-17 NOTE — Consult Note (Signed)
PHARMACY CONSULT NOTE - FOLLOW UP  Pharmacy Consult for Electrolyte Monitoring and Replacement   Recent Labs: Potassium (mmol/L)  Date Value  07/17/2018 3.1 (L)  07/07/2013 3.5   Magnesium (mg/dL)  Date Value  09/32/6712 2.8 (H)   Calcium (mg/dL)  Date Value  45/80/9983 8.5 (L)   Calcium, Total (mg/dL)  Date Value  38/25/0539 9.7   Albumin (g/dL)  Date Value  76/73/4193 3.0 (L)  07/06/2013 4.0   Phosphorus (mg/dL)  Date Value  79/03/4095 3.4   Sodium (mmol/L)  Date Value  07/17/2018 145  11/15/2014 139  07/06/2013 138     Assessment: Patient coded on 08/10/22, now code ice. Rewarming phase planned to start 5/29 afternoon.  Goal of Therapy:  Electrolytes wnl's  Plan:  5/31 AM: K 3.1. Will order KCL 10 mEq IV x 5 doses.   Will recheck K+ @ 1800   Recheck Mg, Phos, and K+ with AM labs and continue to replace as needed.   Gardner Candle, PharmD, BCPS Clinical Pharmacist 07/17/2018 5:17 AM

## 2018-07-17 NOTE — Progress Notes (Signed)
[facility] [place] [facility]   PATIENT NAME: Tiffany Barber    MR#:  [id number]  DATE OF BIRTH:  1941-02-07  SUBJECTIVE:  Patient seen and evaluated today Currently on ventilator Ventilator settings Tidal volume 500 Rate 12 PEEP 5 FiO2 24 %  REVIEW OF SYSTEMS:    Unable to obtain as patient is sedated on vent   Tolerating Diet:npo  DRUG ALLERGIES:   Allergies  Allergen Reactions  . Ace Inhibitors Other (See Comments)    Other reaction(s): angioedema resulting in intubation Other reaction(s): angioedema resulting in intubation   . Nifedipine Swelling and Shortness Of Breath  . Lisinopril     VITALS:  Blood pressure (!) 139/57, pulse 79, temperature 99 F (37.2 C), temperature source Rectal, resp. rate 15, height 5' (1.524 m), weight 93.8 kg, SpO2 99 %.  PHYSICAL EXAMINATION:  Constitutional: Intubated and sedated on ventilator  HENT: Normocephalic. . Intubated  eyes: Conjunctivae and EOM are normal. PERRLA, no scleral icterus.  Neck:  No JVD. No tracheal deviation. CVS: RRR, S1/S2 +, no murmurs, no gallops, no carotid bruit.  Pulmonary: Patient with bilateral crackles at bases Abdominal: Soft. BS +,  no distension, tenderness, rebound or guarding.  Musculoskeletal: 1+ lower extremity edema Neuro: on ventilator Skin: Skin is warm and dry. No rash noted. Psychiatric: Sedated  LABORATORY PANEL:   CBC Recent Labs  Lab 07/17/18 0547  WBC 16.9*  HGB 8.6*  HCT 27.6*  PLT 441*   ------------------------------------------------------------------------------------------------------------------  Chemistries  Recent Labs  Lab 07/15/18 0330  07/17/18 0432  NA  --    < > 145  K  --    < > 3.1*  CL  --    < > 98  CO2  --    < > 33*  GLUCOSE  --    < > 108*  BUN  --    < > 44*  CREATININE  --    < > 0.76  CALCIUM  --    < > 8.5*  MG 2.3   < > 2.8*  AST 141*  --   --   ALT 139*  --   --   ALKPHOS 135*  --   --   BILITOT 1.2  --    --    < > = values in this interval not displayed.   ------------------------------------------------------------------------------------------------------------------  Cardiac Enzymes Recent Labs  Lab 07/15/18 0142 07/15/18 0749 07/15/18 1314  TROPONINI 0.63* 0.54* 0.45*   ------------------------------------------------------------------------------------------------------------------  RADIOLOGY:  Ct Head Wo Contrast  Result Date: 07/16/2018 CLINICAL DATA:  Follow-up cardiac arrest.  Treated with hypothermia. EXAM: CT HEAD WITHOUT CONTRAST TECHNIQUE: Contiguous axial images were obtained from the base of the skull through the vertex without intravenous contrast. COMPARISON:  CT 07/14/2018.  MRI 06/26/2009. FINDINGS: Brain: Brainstem and cerebellum appear normal. There is an indistinct appearance of the deep brain structures including the basal ganglia and thalami raising the possibility of hypoxic ischemic injury of the deep brain structures. Chronic small-vessel ischemic changes again seen within the hemispheric white matter. No evidence of diffuse cortical edema or swelling. No evidence of hemorrhage. No hydrocephalus or extra-axial collection. Vascular: There is atherosclerotic calcification of the major vessels at the base of the brain. Skull: Negative Sinuses/Orbits: Clear/normal Other: None IMPRESSION: Indistinct appearance of the deep brain structures including the basal ganglia and thalami raising the possibility deep brain hypoxic ischemic injury. This is not absolutely definite. No evidence of hemorrhage. No evidence of diffuse cortical swelling  or injury. Electronically Signed   By: Paulina FusiMark  Shogry M.D.   On: 07/16/2018 09:00   [place] Venous Img Lower Bilateral  Result Date: 07/16/2018 CLINICAL DATA:  Lower extremity edema EXAM: BILATERAL LOWER EXTREMITY VENOUS DUPLEX ULTRASOUND TECHNIQUE: Doppler venous assessment of the bilateral lower extremity deep venous system was performed,  including characterization of spectral flow, compressibility, and phasicity. COMPARISON:  None. FINDINGS: There is complete compressibility of the common femoral, femoral, and popliteal veins. Doppler analysis demonstrates respiratory phasicity and augmentation of flow with calf compression. No obvious superficial vein or calf vein thrombosis. IMPRESSION: No evidence of lower extremity DVT. Electronically Signed   By: Jolaine ClickArthur  Hoss M.D.   On: 07/16/2018 10:35     ASSESSMENT AND PLAN:   78 year old female status post pacemaker for second-degree AV block may 14th 2020 who initially presented emergency room due to shortness of breath and subsequently CODE BLUE was called on May 28.  1.  Acute hypoxic respiratory failure due to acute on chronic diastolic heart failure status post mechanical intubation and status post PEA arrest Continue full vent support Continue vent bundle Management as per intensivist Continue Lasix Levophed to keep map greater than 65  2.  PEA arrest of unclear etiology with early signs of anoxic brain injury Patient evaluated by cardiology.. 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  Hypokalemia: Replete PRN  4.  Hyponatremia in setting of CHF: Continue to monitor  Management plans discussed with the patient's son yesterday  CODE STATUS: full  TOTAL TIME TAKING CARE OF THIS PATIENT: 38 minutes.   Consider PC consult if no improvement in next few days  POSSIBLE D/C ??, DEPENDING ON CLINICAL CONDITION.   Ihor AustinPavan Pyreddy M.D on 07/17/2018 at 1:01 PM  Between 7am to 6pm - Pager - [phone] After 6pm go to [email or web address] - password [email or web address]  [facility]  Office  [phone]  CC: Primary care physician; Emi BelfastGessner, Deborah B, FNP  Note: This dictation was prepared with Dragon dictation along with smaller  phrase technology. Any transcriptional errors that result from this process are unintentional.

## 2018-07-17 NOTE — Progress Notes (Signed)
Pastoral Care Visit   07/17/18 1540  Clinical Encounter Type  Visited With Patient and family together  Visit Type Follow-up;Critical Care  Consult/Referral To Chaplain  Spiritual Encounters  Spiritual Needs Grief support  Stress Factors  Family Stress Factors Health changes   Chap visited w/ fam due to med staff identifying pt as actively dying.  Chap entered room while pt was non responsive and assisted by two family members (a man and woman).  Mirna Mires offered support to family. Was declined.  Chap let fam know he is available should they need any support.  Milinda Antis, [address]

## 2018-07-17 NOTE — Progress Notes (Signed)
Initial Nutrition Assessment  DOCUMENTATION CODES:   Obesity unspecified  INTERVENTION:   Recommend initiate vital HP @45ml /hr  Free water flushes 70ml q4 hours to maintain tube patency   Regimen provides 1080kcal/day, 95g/day protein, 1051ml/day free water   Recommend liquid MVI daily via tube   NUTRITION DIAGNOSIS:   Inadequate oral intake related to inability to eat(pt sedated and ventilated ) as evidenced by NPO status.  GOAL:   Provide needs based on ASPEN/SCCM guidelines  MONITOR:   Labs, Weight trends, I & O's, Skin, Vent status  REASON FOR ASSESSMENT:   Ventilator    ASSESSMENT:   78 y.o. female hypertension, hyperlipidemia, left ventricular hypertrophy, stroke and recent heart block with pacemaker placement admitted with SOB and now s/p PEA cardiac arrest   RD working remotely.  Pt sedated and ventilated. OGT in place. Per MD note, pt with possible anoxic brain injury. Recommend initiate tube feeds if family wishes to proceed with full scope of care. Per chart, pt appears fairly weight stable pta.   Medications reviewed and include: aspirin, colace, lovenox, pepcid, insulin, cefazolin  Labs reviewed: K 3.1(L), P 3.4 wnl, Mg 2.8(H) Triglycerides 160(H)- 5/30 Wbc- 16.9(H), Hgb 8.6(L), Hct 27.6(L)  Patient is currently intubated on ventilator support MV: 6.6 L/min Temp (24hrs), Avg:98.8 F (37.1 C), Min:97.7 F (36.5 C), Max:99.5 F (37.5 C)  Propofol: none   MAP- >41mmHg  UOP-  Unable to complete Nutrition-Focused physical exam at this time.   Diet Order:   Diet Order    None     EDUCATION NEEDS:   No education needs have been identified at this time  Skin:  Skin Assessment: Reviewed RN Assessment(incision chest )  Last BM:  5/28- type 6  Height:   Ht Readings from Last 1 Encounters:  07/11/18 5' (1.524 m)    Weight:   Wt Readings from Last 1 Encounters:  07/17/18 93.8 kg    Ideal Body Weight:  45.5 kg  BMI:  Body  mass index is 40.39 kg/m.  Estimated Nutritional Needs:   Kcal:  1030-1310kcal/day   Protein:  >91g/day   Fluid:  >1.1L/day   Betsey Holiday MS, RD, LDN Pager #- [phone] Office#- [phone] After Hours Pager: [phone]

## 2018-07-17 NOTE — Progress Notes (Signed)
CRITICAL CARE NOTE        SUBJECTIVE FINDINGS & SIGNIFICANT EVENTS   Patient remains critically ill Prognosis is guarded  Essentially unchanged from previous.  Discussed with neurologist, overall poor prognosis.  Family conference today.   I had met with family again today and they had requested a transfer to [facility] for aggressive care.  I had discussed to the this request with neurologist who does not believe that patient would have a meaningful recovery however we will respect family wishes and attempt to transfer patient as per family's request.  PAST MEDICAL HISTORY   Past Medical History:  Diagnosis Date   Arthritis    left knee and left hand in particular   Hyperlipidemia    Hypertension    LVH (left ventricular hypertrophy)    Stroke [facility])      SURGICAL HISTORY   Past Surgical History:  Procedure Laterality Date   JOINT REPLACEMENT Left 2019   left knee   PACEMAKER INSERTION N/A 06/30/2018   Procedure: INSERTION PACEMAKER;  Surgeon: Isaias Cowman, MD;  Location: [facility] ORS;  Service: Cardiovascular;  Laterality: N/A;   REPLACEMENT TOTAL KNEE BILATERAL       FAMILY HISTORY   Family History  Problem Relation Age of Onset   Hypertension Mother    Stroke Mother    Diabetes Daughter    Hyperlipidemia Daughter      SOCIAL HISTORY   Social History   Tobacco Use   Smoking status: Never Smoker   Smokeless tobacco: Never Used  Substance Use Topics   Alcohol use: No   Drug use: No     MEDICATIONS   Current Medication:  Current Facility-Administered Medications:    0.9 %  sodium chloride infusion, 250 mL, Intravenous, PRN, Demetrios Loll, MD, Stopped at 07/15/18 2143   acetaminophen (TYLENOL) tablet 650 mg, 650 mg, Oral, Q6H PRN **OR**  acetaminophen (TYLENOL) suppository 650 mg, 650 mg, Rectal, Q6H PRN, Demetrios Loll, MD   albuterol (PROVENTIL) (2.5 MG/3ML) 0.083% nebulizer solution 2.5 mg, 2.5 mg, Nebulization, Q2H PRN, Demetrios Loll, MD, 2.5 mg at 07/14/18 0444   aspirin chewable tablet 81 mg, 81 mg, Per Tube, Daily, Awilda Bill, NP, 81 mg at 07/15/18 8786   bisacodyl (DULCOLAX) suppository 10 mg, 10 mg, Rectal, Daily PRN, Awilda Bill, NP   cefTRIAXone (ROCEPHIN) 1 g in sodium chloride 0.9 % 100 mL IVPB, 1 g, Intravenous, q1800, Awilda Bill, NP, Stopped at 07/16/18 1809   chlorhexidine gluconate (MEDLINE KIT) (PERIDEX) 0.12 % solution 15 mL, 15 mL, Mouth Rinse, BID, Lanney Gins, Cuca Benassi, MD, 15 mL at 07/16/18 1948   Chlorhexidine Gluconate Cloth 2 % PADS 6 each, 6 each, Topical, Daily, Darel Hong D, NP, 6 each at 07/16/18 1056   docusate (COLACE) 50 MG/5ML liquid 100 mg, 100 mg, Per Tube, BID, Awilda Bill, NP, 100 mg at 07/16/18 2155   enoxaparin (LOVENOX) injection 40 mg, 40 mg, Subcutaneous, Q12H, Mody, Sital, MD, 40 mg at 07/16/18 2153   famotidine (PEPCID) IVPB 20 mg premix, 20 mg, Intravenous, Q24H, Blakeney, Dana G, NP, Stopped at 07/16/18 1734   fentaNYL 2564mg in NS 2548m(1065mml) infusion-PREMIX, 0-400 mcg/hr, Intravenous, Continuous, Blakeney, DanDreama SaaP   furosemide (LASIX) injection 80 mg, 80 mg, Intravenous, Q12H, Mody, Sital, MD, 80 mg at 07/17/18 0523   insulin aspart (novoLOG) injection 0-15 Units, 0-15 Units, Subcutaneous, Q4H, AleOttie GlazierD, 2 Units at 07/15/18 1542   ipratropium-albuterol (DUONEB) 0.5-2.5 (3) MG/3ML nebulizer  solution 3 mL, 3 mL, Nebulization, TID, Mody, Sital, MD, 3 mL at 07/16/18 2003   levETIRAcetam (KEPPRA) IVPB 500 mg/100 mL premix, 500 mg, Intravenous, Q12H, Bradly Bienenstock, NP, Stopped at 07/16/18 2211   MEDLINE mouth rinse, 15 mL, Mouth Rinse, 10 times per day, Ottie Glazier, MD, 15 mL at 07/17/18 0521   midazolam (VERSED) injection 1 mg, 1 mg,  Intravenous, Q2H PRN, Darel Hong D, NP, 1 mg at 07/15/18 1147   norepinephrine (LEVOPHED) 83m in 2561mpremix infusion, 0-40 mcg/min, Intravenous, Titrated, BlAwilda BillNP, Stopped at 07/16/18 1950   ondansetron (ZOFRAN) tablet 4 mg, 4 mg, Oral, Q6H PRN **OR** ondansetron (ZOFRAN) injection 4 mg, 4 mg, Intravenous, Q6H PRN, ChDemetrios LollMD   potassium chloride 10 mEq in 50 mL *CENTRAL LINE* IVPB, 10 mEq, Intravenous, Q1 Hr x 5, Hallaji, Sheema M, RPH, Last Rate: 50 mL/hr at 07/17/18 0639, 10 mEq at 07/17/18 [id number] pravastatin (PRAVACHOL) tablet 20 mg, 20 mg, Per Tube, q1800, BlAwilda BillNP, 20 mg at 07/15/18 1707   propofol (DIPRIVAN) 1000 MG/100ML infusion, 0-50 mcg/kg/min, Intravenous, Continuous, BlAwilda BillNP, Stopped at 07/16/18 [id number] sodium chloride flush (NS) 0.9 % injection 10-40 mL, 10-40 mL, Intracatheter, Q12H, KeDarel Hong, NP, 10 mL at 07/16/18 2156   sodium chloride flush (NS) 0.9 % injection 10-40 mL, 10-40 mL, Intracatheter, PRN, KeDarel Hong, NP   sodium chloride flush (NS) 0.9 % injection 3 mL, 3 mL, Intravenous, Q12H, ChDemetrios LollMD, 3 mL at 07/16/18 2157   sodium chloride flush (NS) 0.9 % injection 3 mL, 3 mL, Intravenous, PRN, ChDemetrios LollMD   valproate (DEPACON) 500 mg in dextrose 5 % 50 mL IVPB, 500 mg, Intravenous, Q8H, ZeLeotis PainMD, Stopped at 07/17/18 0625   vecuronium (NORCURON) injection 10 mg, 10 mg, Intravenous, Q2H PRN, KeDarel Hong, NP, 10 mg at 07/15/18 1819    ALLERGIES   Ace inhibitors; Nifedipine; and Lisinopril    REVIEW OF SYSTEMS    Unable to obtain patient comatose  PHYSICAL EXAMINATION   Vitals:   07/17/18 0500 07/17/18 0600  BP: (!) 142/51 (!) 139/47  Pulse: 77 72  Resp: 14 13  Temp: 99.5 F (37.5 C) 99.3 F (37.4 C)  SpO2: 95% 95%    GENERAL:GCS4T HEAD: Normocephalic, atraumatic.  EYES: Pupils equal, round, reactive to light.  No scleral icterus.  MOUTH: Moist mucosal  membrane. NECK: Supple. No thyromegaly. No nodules. No JVD.  PULMONARY: mild bibasilar crackles CARDIOVASCULAR: S1 and S2. Regular rate and rhythm. No murmurs, rubs, or gallops.  GASTROINTESTINAL: Soft, nontender, non-distended. No masses. Positive bowel sounds. No hepatosplenomegaly.  MUSCULOSKELETAL: No swelling, clubbing, or edema.  NEUROLOGIC: Mild distress due to acute illness SKIN:intact,warm,dry   LABS AND IMAGING       LAB RESULTS: Recent Labs  Lab 07/16/18 0242 07/16/18 0455 07/16/18 1632 07/17/18 0432  NA 137 140  --  145  K 3.3* 3.1* 3.2* 3.1*  CL 93* 95*  --  98  CO2 33* 32  --  33*  BUN 46* 45*  --  44*  CREATININE 0.85 0.80  --  0.76  GLUCOSE 116* 122*  --  108*   Recent Labs  Lab 07/15/18 0330 07/16/18 0455 07/17/18 0547  HGB 8.7* 8.7* 8.6*  HCT 26.4* 28.2* 27.6*  WBC 21.4* 21.8* 16.9*  PLT 495* 535* 441*     IMAGING RESULTS: Ct Head Wo Contrast  Result Date: 07/16/2018 CLINICAL  DATA:  Follow-up cardiac arrest.  Treated with hypothermia. EXAM: CT HEAD WITHOUT CONTRAST TECHNIQUE: Contiguous axial images were obtained from the base of the skull through the vertex without intravenous contrast. COMPARISON:  CT 07/14/2018.  MRI 06/26/2009. FINDINGS: Brain: Brainstem and cerebellum appear normal. There is an indistinct appearance of the deep brain structures including the basal ganglia and thalami raising the possibility of hypoxic ischemic injury of the deep brain structures. Chronic small-vessel ischemic changes again seen within the hemispheric white matter. No evidence of diffuse cortical edema or swelling. No evidence of hemorrhage. No hydrocephalus or extra-axial collection. Vascular: There is atherosclerotic calcification of the major vessels at the base of the brain. Skull: Negative Sinuses/Orbits: Clear/normal Other: None IMPRESSION: Indistinct appearance of the deep brain structures including the basal ganglia and thalami raising the possibility deep  brain hypoxic ischemic injury. This is not absolutely definite. No evidence of hemorrhage. No evidence of diffuse cortical swelling or injury. Electronically Signed   By: Nelson Chimes M.D.   On: 07/16/2018 09:00   US Venous Img Lower Bilateral  Result Date: 07/16/2018 CLINICAL DATA:  Lower extremity edema EXAM: BILATERAL LOWER EXTREMITY VENOUS DUPLEX ULTRASOUND TECHNIQUE: Doppler venous assessment of the bilateral lower extremity deep venous system was performed, including characterization of spectral flow, compressibility, and phasicity. COMPARISON:  None. FINDINGS: There is complete compressibility of the common femoral, femoral, and popliteal veins. Doppler analysis demonstrates respiratory phasicity and augmentation of flow with calf compression. No obvious superficial vein or calf vein thrombosis. IMPRESSION: No evidence of lower extremity DVT. Electronically Signed   By: Marybelle Killings M.D.   On: 07/16/2018 10:35      ASSESSMENT AND PLAN        CARDIAC FAILURE-status post PEA arrest with ACLS and ROSC -poor prognosis - has not had any improvement in neuro status-she does have weak gag reflex only. -follow up cardiac enzymes as indicated -CTH repeat - cosnsistent with anoxic brain injury - neuro oncase- appreciate input ICU monitoring    Acutely comatose - likely due to above with resultant anoxic brain injury -Neurology consultation-appreciate input -That is post EEG and repeat CT head -family conference to address goals of care  GI/Nutrition GI PROPHYLAXIS as indicated DIET-->TF's as tolerated Constipation protocol as indicated  ENDO - ICU hypoglycemic\Hyperglycemia protocol -check FSBS per protocol   ELECTROLYTES -follow labs as needed -replace as needed -pharmacy consultation   DVT/GI PRX ordered -SCDs  TRANSFUSIONS AS NEEDED MONITOR FSBS ASSESS the need for LABS as needed   Critical care provider statement:  Critical care time  (minutes):31 Critical care time was exclusive of: Separately billable procedures and treating other patients Critical care was necessary to treat or prevent imminent or life-threatening deterioration of the following conditions:Cardiac arrest, unresponsive mental state, acutely comatose,systolic CHF, multiple comorbid condition Critical care was time spent personally by me on the following activities: Development of treatment plan with patient or surrogate, discussions with consultants, evaluation of patient's response to treatment, examination of patient, obtaining history from patient or surrogate, ordering and performing treatments and interventions, ordering and review of laboratory studies and re-evaluation of patient's condition. I assumed direction of critical care for this patient from another provider in my specialty: no   This document was prepared using Dragon voice recognition software and may include unintentional dictation errors.   Ottie Glazier, M.D.  Division of [place]

## 2018-07-17 NOTE — Consult Note (Signed)
On 07/14/18 s/p PEA cardiac arrest without clear downtime.  Not following commands.   Past Medical History:  Diagnosis Date  . 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 Hyperlipidemia   . Hypertension   . LVH (left ventricular hypertrophy)   . Stroke [facility])     Past Surgical History:  Procedure Laterality Date  . JOINT REPLACEMENT Left 2019   left knee  . PACEMAKER INSERTION N/A 06/30/2018   Procedure: INSERTION PACEMAKER;  Surgeon: Marcina Millard, MD;  Location: [facility] ORS;  Service: Cardiovascular;  Laterality: N/A;  . REPLACEMENT TOTAL KNEE BILATERAL      Family History  Problem Relation Age of Onset  . Hypertension Mother   . Stroke Mother   . Diabetes Daughter   . Hyperlipidemia Daughter     Social History:  reports that she has never smoked. She has never used smokeless tobacco. She reports that she does not drink alcohol or use drugs.  Allergies  Allergen Reactions  . Ace Inhibitors Other (See Comments)    Other reaction(s): angioedema resulting in intubation Other reaction(s): angioedema resulting in intubation   . Nifedipine Swelling and Shortness Of Breath  . Lisinopril     Medications: I have reviewed the patient's current medications.  ROS: Unable to obtain due to sedation   Physical Examination: Blood pressure (!) 139/57, pulse 79, temperature 99 F (37.2 C), temperature source Rectal, resp. rate 15, height 5' (1.524 m), weight 93.8 kg, SpO2 93 %.  Does not follow commands No withdrawal from pain  Minimal cough/gag present off sedation Pupil reactive on right side.   Laboratory Studies:   Basic Metabolic Panel: Recent Labs  Lab 07/14/18 1506 07/15/18 0142 07/15/18 0330 07/15/18 0749 07/15/18 1314 07/15/18 2310 07/16/18 0242 07/16/18 0455 07/16/18 1632 07/17/18 0432  NA 131* 133*  --   --  136  --  137 140  --  145  K 3.9 2.2*  --   --  2.5* 2.9* 3.3* 3.1* 3.2* 3.1*  CL 88* 87*  --   --  89*  --  93* 95*  --  98  CO2  25 32  --   --  33*  --  33* 32  --  33*  GLUCOSE 165* 150*  --   --  135*  --  116* 122*  --  108*  BUN 58* 56*  --   --  50*  --  46* 45*  --  44*  CREATININE 1.21* 0.91  --   --  0.84  --  0.85 0.80  --  0.76  CALCIUM 9.1 8.7*  --   --  8.8*  --  8.2* 8.4*  --  8.5*  MG 2.8*  --  2.3 2.3  --  2.8*  --   --   --  2.8*  PHOS 6.9*  --   --   --   --   --   --   --   --  3.4    Liver Function Tests: Recent Labs  Lab 07/11/18 1019 07/14/18 1506 07/15/18 0330  AST 50* 199* 141*  ALT 63* 156* 139*  ALKPHOS 106 151* 135*  BILITOT 0.9 1.0 1.2  PROT 7.3 6.3* 6.1*  ALBUMIN 3.8 3.1* 3.0*   No results for input(s): LIPASE, AMYLASE in the last 168 hours. No results for input(s): AMMONIA in the last 168 hours.  CBC: Recent Labs  Lab 07/11/18 1019 07/12/18 0434 07/14/18 1506 07/15/18  0330 07/16/18 0455 07/17/18 0547  WBC 10.3 10.5 19.3* 21.4* 21.8* 16.9*  NEUTROABS 8.8*  --  17.2*  --  19.3*  --   HGB 9.5* 8.8* 9.3* 8.7* 8.7* 8.6*  HCT 30.4* 27.3* 29.6* 26.4* 28.2* 27.6*  MCV 100.0 97.5 99.3 93.0 97.2 97.5  PLT 433* 409* 481* 495* 535* 441*    Cardiac Enzymes: Recent Labs  Lab 07/14/18 1506 07/14/18 2017 07/15/18 0142 07/15/18 0749 07/15/18 1314  TROPONINI 0.19* 0.42* 0.63* 0.54* 0.45*    BNP: Invalid input(s): POCBNP  CBG: Recent Labs  Lab 07/16/18 1936 07/16/18 2347 07/17/18 0408 07/17/18 0722 07/17/18 1128  GLUCAP 107* 95 95 86 97    Microbiology: Results for orders placed or performed during the hospital encounter of 07/11/18  SARS Coronavirus 2 (CEPHEID- Performed in [facility] hospital lab), Hosp Order     Status: None   Collection Time: 07/11/18 10:19 AM  Result Value Ref Range Status   SARS Coronavirus 2 NEGATIVE NEGATIVE Final    Comment: (NOTE) If result is NEGATIVE SARS-CoV-2 target nucleic acids are NOT DETECTED. The SARS-CoV-2 RNA is generally detectable in upper and lower  respiratory specimens during the acute phase of infection. The  lowest  concentration of SARS-CoV-2 viral copies this assay can detect is 250  copies / mL. A negative result does not preclude SARS-CoV-2 infection  and should not be used as the sole basis for treatment or other  patient management decisions.  A negative result may occur with  improper specimen collection / handling, submission of specimen other  than nasopharyngeal swab, presence of viral mutation(s) within the  areas targeted by this assay, and inadequate number of viral copies  (<250 copies / mL). A negative result must be combined with clinical  observations, patient history, and epidemiological information. If result is POSITIVE SARS-CoV-2 target nucleic acids are DETECTED. The SARS-CoV-2 RNA is generally detectable in upper and lower  respiratory specimens dur ing the acute phase of infection.  Positive  results are indicative of active infection with SARS-CoV-2.  Clinical  correlation with patient history and other diagnostic information is  necessary to determine patient infection status.  Positive results do  not rule out bacterial infection or co-infection with other viruses. If result is PRESUMPTIVE POSTIVE SARS-CoV-2 nucleic acids MAY BE PRESENT.   A presumptive positive result was obtained on the submitted specimen  and confirmed on repeat testing.  While 2019 novel coronavirus  (SARS-CoV-2) nucleic acids may be present in the submitted sample  additional confirmatory testing may be necessary for epidemiological  and / or clinical management purposes  to differentiate between  SARS-CoV-2 and other Sarbecovirus currently known to infect humans.  If clinically indicated additional testing with an alternate test  methodology [phone]) is advised. The SARS-CoV-2 RNA is generally  detectable in upper and lower respiratory sp ecimens during the acute  phase of infection. The expected result is Negative. Fact Sheet for Patients:   BoilerBrush.com.cy Fact Sheet for Healthcare Providers: https://pope.com/ This test is not yet approved or cleared by the [place] FDA and has been authorized for detection and/or diagnosis of SARS-CoV-2 by FDA under an Emergency Use Authorization (EUA).  This EUA will remain in effect (meaning this test can be used) for the duration of the COVID-19 declaration under Section 564(b)(1) of the Act, 21 U.S.C. section 360bbb-3(b)(1), unless the authorization is terminated or revoked sooner. Performed at [facility], [address] [place], [place] [address]   Urine Culture  Status: Abnormal (Preliminary result)   Collection Time: 07/13/18  2:32 PM  Result Value Ref Range Status   Specimen Description   Final    URINE, CLEAN CATCH Performed at [facility], [address], [place], [place] [address]    Special Requests   Final    NONE Performed at [facility], [address] [place], [place] [address]    Culture >=100,000 COLONIES/mL ESCHERICHIA COLI (A)  Final   Report Status PENDING  Incomplete   Organism ID, Bacteria ESCHERICHIA COLI (A)  Final      Susceptibility   Escherichia coli - MIC*    AMPICILLIN >=32 RESISTANT Resistant     CEFAZOLIN <=4 SENSITIVE Sensitive     CEFTRIAXONE <=1 SENSITIVE Sensitive     CIPROFLOXACIN 1 SENSITIVE Sensitive     GENTAMICIN <=1 SENSITIVE Sensitive     IMIPENEM <=0.25 SENSITIVE Sensitive     NITROFURANTOIN <=16 SENSITIVE Sensitive     TRIMETH/SULFA >=320 RESISTANT Resistant     AMPICILLIN/SULBACTAM 16 INTERMEDIATE Intermediate     PIP/TAZO <=4 SENSITIVE Sensitive     Extended ESBL NEGATIVE Sensitive     * >=100,000 COLONIES/mL ESCHERICHIA COLI    Coagulation Studies: Recent Labs    07/14/18 1505  LABPROT 17.1*  INR 1.4*    Urinalysis:  Recent Labs  Lab 07/13/18 0538  COLORURINE YELLOW*  LABSPEC 1.012  PHURINE 5.0  GLUCOSEU NEGATIVE  HGBUR  NEGATIVE  BILIRUBINUR NEGATIVE  KETONESUR NEGATIVE  PROTEINUR 30*  NITRITE NEGATIVE  LEUKOCYTESUR MODERATE*    Lipid Panel:     Component Value Date/Time   CHOL 193 11/15/2014   TRIG 160 (H) 07/16/2018 0455   HDL 59 11/15/2014   LDLCALC 114 11/15/2014    HgbA1C: No results found for: HGBA1C  Urine Drug Screen:  No results found for: LABOPIA, COCAINSCRNUR, LABBENZ, AMPHETMU, THCU, LABBARB  Alcohol Level: No results for input(s): ETH in the last 168 hours.  Other results: EKG: normal EKG, normal sinus rhythm, unchanged from previous tracings.  Imaging: Ct Head Wo Contrast  Result Date: 07/16/2018 CLINICAL DATA:  Follow-up cardiac arrest.  Treated with hypothermia. EXAM: CT HEAD WITHOUT CONTRAST TECHNIQUE: Contiguous axial images were obtained from the base of the skull through the vertex without intravenous contrast. COMPARISON:  CT 07/14/2018.  MRI 06/26/2009. FINDINGS: Brain: Brainstem and cerebellum appear normal. There is an indistinct appearance of the deep brain structures including the basal ganglia and thalami raising the possibility of hypoxic ischemic injury of the deep brain structures. Chronic small-vessel ischemic changes again seen within the hemispheric white matter. No evidence of diffuse cortical edema or swelling. No evidence of hemorrhage. No hydrocephalus or extra-axial collection. Vascular: There is atherosclerotic calcification of the major vessels at the base of the brain. Skull: Negative Sinuses/Orbits: Clear/normal Other: None IMPRESSION: Indistinct appearance of the deep brain structures including the basal ganglia and thalami raising the possibility deep brain hypoxic ischemic injury. This is not absolutely definite. No evidence of hemorrhage. No evidence of diffuse cortical swelling or injury. Electronically Signed   By: Paulina Fusi M.D.   On: 07/16/2018 09:00   US Venous Img Lower Bilateral  Result Date: 07/16/2018 CLINICAL DATA:  Lower extremity edema  EXAM: BILATERAL LOWER EXTREMITY VENOUS DUPLEX ULTRASOUND TECHNIQUE: Doppler venous assessment of the bilateral lower extremity deep venous system was performed, including characterization of spectral flow, compressibility, and phasicity. COMPARISON:  None. FINDINGS: There is complete compressibility of the common femoral, femoral, and popliteal veins. Doppler analysis demonstrates  respiratory phasicity and augmentation of flow with calf compression. No obvious superficial vein or calf vein thrombosis. IMPRESSION: No evidence of lower extremity DVT. Electronically Signed   By: Jolaine ClickArthur  Hoss M.D.   On: 07/16/2018 10:35     Assessment/Plan:  78 y.o. female  hypertension, hyperlipidemia, left ventricular hypertrophy, stroke and recent heart block with pacemaker placement.  The patient presents with worsening shortness of breath for the past 5 day seen on 07/11/2018.   She feels chest tightness, cough, orthopnea and nocturnal dyspnea.   On 07/14/18 s/p PEA cardiac arrest without clear downtime.   - CTH with early signs of anoxia - not brain dead as weak cough and gag - on VPA 500 TID for myoclonus which resolved - MRI unable to obtain as has PPM  - likely poor prognosis given early signs of anoxia on CTH and EEG with burst suppression.   - s/p discussion with son Tiffany Barber over the phone (states Tiffany Barber is talking to him). Don't think he understands the full spoke of situation - Wants to continue Full code.   Pauletta Barber, Tiffany Scobie  07/17/2018, 12:22 PM

## 2018-07-17 NOTE — Progress Notes (Signed)
PHARMACIST - PHYSICIAN COMMUNICATION  CONCERNING: IV to Oral Route Change Policy  RECOMMENDATION: This patient is receiving famotidine by the intravenous route.  Based on criteria approved by the Pharmacy and Therapeutics Committee, the intravenous medication(s) is/are being converted to the equivalent oral dose form(s).   DESCRIPTION: These criteria include:  The patient is eating (either orally or via tube) and/or has been taking other orally administered medications for a least 24 hours  The patient has no evidence of active gastrointestinal bleeding or impaired GI absorption (gastrectomy, short bowel, patient on TNA or NPO).  If you have questions about this conversion, please contact the Pharmacy Department  []   [phone] )  [facility]   [place], [facility] 07/17/2018 12:52 PM

## 2018-07-18 ENCOUNTER — Ambulatory Visit: Payer: Medicare Other | Admitting: Family

## 2018-07-18 LAB — URINE CULTURE: Culture: >=100000 — AB

## 2018-07-18 LAB — CBC
HCT: 29.3 % — ABNORMAL LOW (ref 36.0–46.0)
Hemoglobin: 9.1 g/dL — ABNORMAL LOW (ref 12.0–15.0)
MCH: 30.3 pg (ref 26.0–34.0)
MCHC: 31.1 g/dL (ref 30.0–36.0)
MCV: 97.7 fL (ref 80.0–100.0)
Platelets: 461 10*3/uL — ABNORMAL HIGH (ref 150–400)
RBC: 3 MIL/uL — ABNORMAL LOW (ref 3.87–5.11)
RDW: 14.5 % (ref 11.5–15.5)
WBC: 14.5 10*3/uL — ABNORMAL HIGH (ref 4.0–10.5)
nRBC: 0 % (ref 0.0–0.2)

## 2018-07-18 LAB — GLUCOSE, CAPILLARY
Glucose-Capillary: 108 mg/dL — ABNORMAL HIGH (ref 70–99)
Glucose-Capillary: 101 mg/dL — ABNORMAL HIGH (ref 70–99)
Glucose-Capillary: 100 mg/dL — ABNORMAL HIGH (ref 70–99)
Glucose-Capillary: 103 mg/dL — ABNORMAL HIGH (ref 70–99)
Glucose-Capillary: 100 mg/dL — ABNORMAL HIGH (ref 70–99)
Glucose-Capillary: 107 mg/dL — ABNORMAL HIGH (ref 70–99)

## 2018-07-18 LAB — BASIC METABOLIC PANEL
Anion gap: 11 (ref 5–15)
BUN: 38 mg/dL — ABNORMAL HIGH (ref 8–23)
CO2: 35 mmol/L — ABNORMAL HIGH (ref 22–32)
Calcium: 8.6 mg/dL — ABNORMAL LOW (ref 8.9–10.3)
Chloride: 99 mmol/L (ref 98–111)
Creatinine, Ser: 0.69 mg/dL (ref 0.44–1.00)
GFR calc Af Amer: >60 mL/min (ref >60)
GFR calc non Af Amer: >60 mL/min (ref >60)
Glucose, Bld: 118 mg/dL — ABNORMAL HIGH (ref 70–99)
Potassium: 3.4 mmol/L — ABNORMAL LOW (ref 3.5–5.1)
Sodium: 145 mmol/L (ref 135–145)

## 2018-07-18 LAB — MAGNESIUM: Magnesium: 2.6 mg/dL — ABNORMAL HIGH (ref 1.7–2.4)

## 2018-07-18 LAB — PHOSPHORUS: Phosphorus: 2.9 mg/dL (ref 2.5–4.6)

## 2018-07-18 MED ORDER — CHLORHEXIDINE GLUCONATE CLOTH 2 % EX PADS
6.0000 | MEDICATED_PAD | Freq: Every day | CUTANEOUS | Status: DC
  Administered 2018-07-19 (×2): 6 via TOPICAL

## 2018-07-18 MED ORDER — ENOXAPARIN SODIUM 40 MG/0.4ML ~~LOC~~ SOLN
40.0000 mg | SUBCUTANEOUS | Status: DC
  Administered 2018-07-19 – 2018-07-20 (×2): 40 mg via SUBCUTANEOUS
  Filled 2018-07-18 (×2): qty 0.4

## 2018-07-18 MED ORDER — POTASSIUM CHLORIDE 10 MEQ/50ML IV SOLN
10.0000 meq | INTRAVENOUS | Status: AC
  Administered 2018-07-18 (×4): 10 meq via INTRAVENOUS
  Filled 2018-07-18 (×4): qty 50

## 2018-07-18 NOTE — Progress Notes (Signed)
[facility] [place] [facility]   PATIENT NAME: Tiffany Barber    MR#:  [id number]  DATE OF BIRTH:  02/08/1941  SUBJECTIVE:  Patient seen and evaluated today Currently on ventilator Ventilator settings Tidal volume 500 Rate 12 PEEP 5 FiO2 24 %  REVIEW OF SYSTEMS:    Unable to obtain as patient is sedated on vent   Tolerating Diet:npo  DRUG ALLERGIES:   Allergies  Allergen Reactions  . Ace Inhibitors Other (See Comments)    Other reaction(s): angioedema resulting in intubation Other reaction(s): angioedema resulting in intubation   . Nifedipine Swelling and Shortness Of Breath  . Lisinopril     VITALS:  Blood pressure (!) 149/75, pulse 74, temperature 98.8 F (37.1 C), temperature source Rectal, resp. rate 14, height 5' (1.524 m), weight 91.9 kg, SpO2 95 %.  PHYSICAL EXAMINATION:  Constitutional: Intubated and sedated on ventilator  HENT: Normocephalic. . Intubated  eyes: Conjunctivae and EOM are normal. PERRLA, no scleral icterus.  Neck:  No JVD. No tracheal deviation. CVS: RRR, S1/S2 +, no murmurs, no gallops, no carotid bruit.  Pulmonary: Patient with bilateral crackles at bases Abdominal: Soft. BS +,  no distension, tenderness, rebound or guarding.  Musculoskeletal: 1+ lower extremity edema Neuro: on ventilator Skin: Skin is warm and dry. No rash noted. Psychiatric: Sedated  LABORATORY PANEL:   CBC Recent Labs  Lab 07/18/18 0331  WBC 14.5*  HGB 9.1*  HCT 29.3*  PLT 461*   ------------------------------------------------------------------------------------------------------------------  Chemistries  Recent Labs  Lab 07/15/18 0330  07/18/18 0331  NA  --    < > 145  K  --    < > 3.4*  CL  --    < > 99  CO2  --    < > 35*  GLUCOSE  --    < > 118*  BUN  --    < > 38*  CREATININE  --    < > 0.69  CALCIUM  --    < > 8.6*  MG 2.3   < > 2.6*  AST 141*  --   --   ALT 139*  --   --   ALKPHOS 135*  --   --   BILITOT 1.2  --    --    < > = values in this interval not displayed.   ------------------------------------------------------------------------------------------------------------------  Cardiac Enzymes Recent Labs  Lab 07/15/18 0142 07/15/18 0749 07/15/18 1314  TROPONINI 0.63* 0.54* 0.45*   ------------------------------------------------------------------------------------------------------------------  RADIOLOGY:  No results found.   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  1.  Acute hypoxic respiratory failure due to acute on chronic diastolic heart failure status post mechanical intubation and status post PEA arrest Continue full vent support Continue vent bundle Management as per intensivist Continue Lasix Levophed to keep map greater than 65  2.  PEA arrest of unclear etiology with early signs of anoxic brain injury Patient evaluated by cardiology.. 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  Hypokalemia: Replete PRN  4.  Hyponatremia in setting of CHF: Continue to monitor  5. Patient [facility] Awaiting placement Transfer to [facility] once bed is available  Management plans  discussed with the patient's son yesterday  CODE STATUS: full  TOTAL TIME TAKING CARE OF THIS PATIENT: 37 minutes.   Consider PC consult if no improvement in next few days  POSSIBLE D/C ??, DEPENDING ON CLINICAL CONDITION.   Ihor Austin M.D on 07/18/2018 at 10:29 AM  Between 7am to 6pm - Pager - [phone] After 6pm go to [email or web address] - password [email or web address]  [facility]  Office  [phone]  CC: Primary care physician; Emi Belfast, FNP  Note: This dictation was prepared with Dragon dictation along with smaller phrase technology. Any transcriptional errors that result from this process are unintentional.

## 2018-07-18 NOTE — Progress Notes (Signed)
CRITICAL CARE NOTE       SUBJECTIVE FINDINGS & SIGNIFICANT EVENTS    Discussed case with Rubye Oaks, MD (Neurointensivist at [facility]) - patient accepted for second opinion as per family request.  Awaiting bed placement and transport.    Patient remains critically ill Prognosis is very poor.   PAST MEDICAL HISTORY   Past Medical History:  Diagnosis Date  . 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 Hyperlipidemia   . Hypertension   . LVH (left ventricular hypertrophy)   . Stroke [facility])      SURGICAL HISTORY   Past Surgical History:  Procedure Laterality Date  . JOINT REPLACEMENT Left 2019   left knee  . PACEMAKER INSERTION N/A 06/30/2018   Procedure: INSERTION PACEMAKER;  Surgeon: Isaias Cowman, MD;  Location: [facility] ORS;  Service: Cardiovascular;  Laterality: N/A;  . REPLACEMENT TOTAL KNEE BILATERAL       FAMILY HISTORY   Family History  Problem Relation Age of Onset  . Hypertension Mother   . Stroke Mother   . Diabetes Daughter   . Hyperlipidemia Daughter      SOCIAL HISTORY   Social History   Tobacco Use  . Smoking status: Never Smoker  . Smokeless tobacco: Never Used  Substance Use Topics  . Alcohol use: No  . Drug use: No     MEDICATIONS   Current Medication:  Current Facility-Administered Medications:  .  0.9 %  sodium chloride infusion, 250 mL, Intravenous, PRN, Demetrios Loll, MD, Stopped at 07/15/18 2143 .  acetaminophen (TYLENOL) tablet 650 mg, 650 mg, Oral, Q6H PRN **OR** acetaminophen (TYLENOL) suppository 650 mg, 650 mg, Rectal, Q6H PRN, Demetrios Loll, MD .  albuterol (PROVENTIL) (2.5 MG/3ML) 0.083% nebulizer solution 2.5 mg, 2.5 mg, Nebulization, Q2H PRN, Demetrios Loll, MD, 2.5 mg at 07/14/18 0444 .  aspirin chewable tablet 81 mg, 81 mg, Per Tube, Daily,  Awilda Bill, NP, 81 mg at 07/17/18 1016 .  bisacodyl (DULCOLAX) suppository 10 mg, 10 mg, Rectal, Daily PRN, Awilda Bill, NP .  ceFAZolin (ANCEF) IVPB 1 g/50 mL premix, 1 g, Intravenous, Q8H, Ottie Glazier, MD, Stopped at 07/18/18 0553 .  chlorhexidine gluconate (MEDLINE KIT) (PERIDEX) 0.12 % solution 15 mL, 15 mL, Mouth Rinse, BID, Lanney Gins, Shaquille Janes, MD, 15 mL at 07/17/18 1924 .  Chlorhexidine Gluconate Cloth 2 % PADS 6 each, 6 each, Topical, Daily, Darel Hong D, NP, 6 each at 07/16/18 1056 .  docusate (COLACE) 50 MG/5ML liquid 100 mg, 100 mg, Per Tube, BID, Awilda Bill, NP, 100 mg at 07/17/18 2102 .  enoxaparin (LOVENOX) injection 40 mg, 40 mg, Subcutaneous, Q12H, Mody, Sital, MD, 40 mg at 07/17/18 2102 .  famotidine (PEPCID) tablet 20 mg, 20 mg, Oral, Daily, Charlett Nose, RPH, 20 mg at 07/17/18 1742 .  fentaNYL 2521mg in NS 2570m(1050mml) infusion-PREMIX, 0-400 mcg/hr, Intravenous, Continuous, Blakeney, Dana G, NP .  furosemide (LASIX) injection 80 mg, 80 mg, Intravenous, Q12H, Mody, Sital, MD, 80 mg at 07/18/18 0522 .  insulin aspart (novoLOG) injection 0-15 Units, 0-15 Units, Subcutaneous, Q4H, AleOttie GlazierD, 2 Units at 07/15/18 1542 .  ipratropium-albuterol (DUONEB) 0.5-2.5 (3) MG/3ML nebulizer solution 3 mL, 3 mL, Nebulization, TID, Mody, Sital, MD, 3 mL at 07/17/18 2036 .  levETIRAcetam (KEPPRA) 500 mg in sodium chloride 0.9 % 100 mL IVPB, 500 mg, Intravenous, Q12H, PyrSaundra ShellingD, Stopped at 07/17/18 2117 .  MEDLINE mouth rinse, 15 mL, Mouth Rinse, 10  times per day, Ottie Glazier, MD, 15 mL at 07/18/18 0525 .  midazolam (VERSED) injection 1 mg, 1 mg, Intravenous, Q2H PRN, Darel Hong D, NP, 1 mg at 07/15/18 1147 .  norepinephrine (LEVOPHED) 4m in 257mpremix infusion, 0-40 mcg/min, Intravenous, Titrated, BlAwilda BillNP, Stopped at 07/16/18 1950 .  ondansetron (ZOFRAN) tablet 4 mg, 4 mg, Oral, Q6H PRN **OR** ondansetron (ZOFRAN) injection 4  mg, 4 mg, Intravenous, Q6H PRN, ChDemetrios LollMD .  potassium chloride 10 mEq in 50 mL *CENTRAL LINE* IVPB, 10 mEq, Intravenous, Q1 Hr x 4, Pyreddy, Pavan, MD, Last Rate: 50 mL/hr at 07/18/18 0621 .  pravastatin (PRAVACHOL) tablet 20 mg, 20 mg, Per Tube, q1800, BlAwilda BillNP, 20 mg at 07/17/18 1742 .  propofol (DIPRIVAN) 1000 MG/100ML infusion, 0-50 mcg/kg/min, Intravenous, Continuous, BlAwilda BillNP, Stopped at 07/16/18 [id number]  sodium chloride flush (NS) 0.9 % injection 10-40 mL, 10-40 mL, Intracatheter, Q12H, KeDarel Hong, NP, 10 mL at 07/17/18 2102 .  sodium chloride flush (NS) 0.9 % injection 10-40 mL, 10-40 mL, Intracatheter, PRN, KeDarel Hong, NP .  sodium chloride flush (NS) 0.9 % injection 3 mL, 3 mL, Intravenous, Q12H, ChDemetrios LollMD, 3 mL at 07/17/18 2103 .  sodium chloride flush (NS) 0.9 % injection 3 mL, 3 mL, Intravenous, PRN, ChDemetrios LollMD .  valproate (DEPACON) 500 mg in dextrose 5 % 50 mL IVPB, 500 mg, Intravenous, Q8H, ZeLeotis PainMD, Stopped at 07/18/18 [id number]  vecuronium (NORCURON) injection 10 mg, 10 mg, Intravenous, Q2H PRN, KeDarel Hong, NP, 10 mg at 07/15/18 1819    ALLERGIES   Ace inhibitors; Nifedipine; and Lisinopril    REVIEW OF SYSTEMS     Unresponsive   PHYSICAL EXAMINATION   Vitals:   07/18/18 0600 07/18/18 0700  BP: (!) 145/68 (!) 137/52  Pulse: 71 73  Resp: 14 14  Temp: 98.6 F (37 C) 98.8 F (37.1 C)  SpO2: 97% 96%    GENERAL:GCS3T HEAD: Normocephalic, atraumatic.  EYES: Pupils equal, round, reactive to light.  No scleral icterus.  MOUTH: Moist mucosal membrane. NECK: Supple. No thyromegaly. No nodules. No JVD.  PULMONARY: CTAB CARDIOVASCULAR: S1 and S2. Regular rate and rhythm. No murmurs, rubs, or gallops.  GASTROINTESTINAL: Soft, nontender, non-distended. No masses. Positive bowel sounds. No hepatosplenomegaly.  MUSCULOSKELETAL: No swelling, clubbing, or edema.  NEUROLOGIC: Mild distress due to acute  illness SKIN:intact,warm,dry   LABS AND IMAGING      LAB RESULTS: Recent Labs  Lab 07/16/18 0455  07/17/18 0432 07/17/18 1754 07/18/18 0331  NA 140  --  145  --  145  K 3.1*   < > 3.1* 2.9* 3.4*  CL 95*  --  98  --  99  CO2 32  --  33*  --  35*  BUN 45*  --  44*  --  38*  CREATININE 0.80  --  0.76  --  0.69  GLUCOSE 122*  --  108*  --  118*   < > = values in this interval not displayed.   Recent Labs  Lab 07/16/18 0455 07/17/18 0547 07/18/18 0331  HGB 8.7* 8.6* 9.1*  HCT 28.2* 27.6* 29.3*  WBC 21.8* 16.9* 14.5*  PLT 535* 441* 461*     IMAGING RESULTS: No results found.    ASSESSMENT AND PLAN      CARDIAC FAILURE-status post PEA arrest with ACLS and ROSC -poor prognosis - has not had any improvement in  neuro status-she does have weak gag reflex only. -follow up cardiac enzymes as indicated -CTH repeat - cosnsistent with anoxic brain injury - neuro oncase- appreciate input ICU monitoring    Acutely comatose -likely due to above with resultant anoxic brain injury -Neurology consultation-appreciate input -That is post EEG and repeat CT head -family conference to address goals of care    GI/Nutrition GI PROPHYLAXIS as indicated DIET-->TF's as tolerated Constipation protocol as indicated  ENDO - ICU hypoglycemic\Hyperglycemia protocol -check FSBS per protocol   ELECTROLYTES -follow labs as needed -replace as needed -pharmacy consultation   DVT/GI PRX ordered -SCDs  TRANSFUSIONS AS NEEDED MONITOR FSBS ASSESS the need for LABS as needed   Critical care provider statement:  Critical care time (minutes):31 Critical care time was exclusive of: Separately billable procedures and treating other patients Critical care was necessary to treat or prevent imminent or life-threatening deterioration of the following conditions:Cardiac arrest, unresponsive mental state, acutely comatose,systolic CHF, multiple comorbid  condition Critical care was time spent personally by me on the following activities: Development of treatment plan with patient or surrogate, discussions with consultants, evaluation of patient's response to treatment, examination of patient, obtaining history from patient or surrogate, ordering and performing treatments and interventions, ordering and review of laboratory studies and re-evaluation of patient's condition. I assumed direction of critical care for this patient from another provider in my specialty: no   This document was prepared using Dragon voice recognition software and may include unintentional dictation errors.   Ottie Glazier, M.D.  Division of [place]

## 2018-07-18 NOTE — Progress Notes (Addendum)
Subjective: Patient remains unresponsive, off sedation, s/p arrest.  Patient to go to [facility] for second opinion.    Objective: Current vital signs: BP (!) 149/75 (BP Location: Right Arm)   Pulse 74   Temp 98.8 F (37.1 C) (Rectal)   Resp 14   Ht 5' (1.524 m)   Wt 91.9 kg   SpO2 95%   BMI 39.57 kg/m  Vital signs in last 24 hours: Temp:  [96.3 F (35.7 C)-99 F (37.2 C)] 98.8 F (37.1 C) (06/01 0800) Pulse Rate:  [60-86] 74 (06/01 0800) Resp:  [12-25] 14 (06/01 0800) BP: (112-155)/(50-93) 149/75 (06/01 0800) SpO2:  [93 %-99 %] 95 % (06/01 0800) FiO2 (%):  [24 %] 24 % (06/01 0751) Weight:  [91.9 kg] 91.9 kg (06/01 0328)  Intake/Output from previous day: 05/31 0701 - 06/01 0700 In: 908.3 [IV Piggyback:908.3] Out: 2790 [Urine:2790] Intake/Output this shift: Total I/O In: 25.8 [IV Piggyback:25.8] Out: 225 [Urine:225] Nutritional status:  Diet Order    None      Neurologic Exam: Mental Status: Patient does not respond to verbal stimuli.  Does not respond to deep sternal rub.  Does not follow commands.  No verbalizations noted.  Cranial Nerves: II: patient does not respond confrontation bilaterally, pupils right 3 mm, left 4 mm,and unreactive bilaterally III,IV,VI: doll's response absent bilaterally. Cold caloric responses absent bilaterally. V,VII: corneal reflex absent bilaterally  VIII: patient does not respond to verbal stimuli IX,X: gag reflex reduced, XI: trapezius strength unable to test bilaterally XII: tongue strength unable to test Motor: Extremities flaccid throughout.  No spontaneous movement noted.  No purposeful movements noted. Sensory: Does not respond to noxious stimuli in any extremity. Deep Tendon Reflexes:  Absent throughout. Plantars: Absent bilaterally Cerebellar: Unable to perform    Lab Results: Basic Metabolic Panel: Recent Labs  Lab 07/14/18 1506  07/15/18 0330 07/15/18 0749 07/15/18 1314 07/15/18 2310 07/16/18 0242  07/16/18 0455 07/16/18 1632 07/17/18 0432 07/17/18 1754 07/18/18 0331  NA 131*   < >  --   --  136  --  137 140  --  145  --  145  K 3.9   < >  --   --  2.5* 2.9* 3.3* 3.1* 3.2* 3.1* 2.9* 3.4*  CL 88*   < >  --   --  89*  --  93* 95*  --  98  --  99  CO2 25   < >  --   --  33*  --  33* 32  --  33*  --  35*  GLUCOSE 165*   < >  --   --  135*  --  116* 122*  --  108*  --  118*  BUN 58*   < >  --   --  50*  --  46* 45*  --  44*  --  38*  CREATININE 1.21*   < >  --   --  0.84  --  0.85 0.80  --  0.76  --  0.69  CALCIUM 9.1   < >  --   --  8.8*  --  8.2* 8.4*  --  8.5*  --  8.6*  MG 2.8*  --  2.3 2.3  --  2.8*  --   --   --  2.8*  --  2.6*  PHOS 6.9*  --   --   --   --   --   --   --   --  3.4  --  2.9   < > = values in this interval not displayed.    Liver Function Tests: Recent Labs  Lab 07/14/18 1506 07/15/18 0330  AST 199* 141*  ALT 156* 139*  ALKPHOS 151* 135*  BILITOT 1.0 1.2  PROT 6.3* 6.1*  ALBUMIN 3.1* 3.0*   No results for input(s): LIPASE, AMYLASE in the last 168 hours. No results for input(s): AMMONIA in the last 168 hours.  CBC: Recent Labs  Lab 07/14/18 1506 07/15/18 0330 07/16/18 0455 07/17/18 0547 07/18/18 0331  WBC 19.3* 21.4* 21.8* 16.9* 14.5*  NEUTROABS 17.2*  --  19.3*  --   --   HGB 9.3* 8.7* 8.7* 8.6* 9.1*  HCT 29.6* 26.4* 28.2* 27.6* 29.3*  MCV 99.3 93.0 97.2 97.5 97.7  PLT 481* 495* 535* 441* 461*    Cardiac Enzymes: Recent Labs  Lab 07/14/18 1506 07/14/18 2017 07/15/18 0142 07/15/18 0749 07/15/18 1314  TROPONINI 0.19* 0.42* 0.63* 0.54* 0.45*    Lipid Panel: Recent Labs  Lab 07/15/18 0330 07/16/18 0455  TRIG 111 160*    CBG: Recent Labs  Lab 07/17/18 1600 07/17/18 1923 07/17/18 2341 07/18/18 0323 07/18/18 0745  GLUCAP 105* 99 110* 108* 101*    Microbiology: Results for orders placed or performed during the hospital encounter of 07/11/18  SARS Coronavirus 2 (CEPHEID- Performed in [place] hospital lab), Hosp Order      Status: None   Collection Time: 07/11/18 10:19 AM  Result Value Ref Range Status   SARS Coronavirus 2 NEGATIVE NEGATIVE Final    Comment: (NOTE) If result is NEGATIVE SARS-CoV-2 target nucleic acids are NOT DETECTED. The SARS-CoV-2 RNA is generally detectable in upper and lower  respiratory specimens during the acute phase of infection. The lowest  concentration of SARS-CoV-2 viral copies this assay can detect is 250  copies / mL. A negative result does not preclude SARS-CoV-2 infection  and should not be used as the sole basis for treatment or other  patient management decisions.  A negative result may occur with  improper specimen collection / handling, submission of specimen other  than nasopharyngeal swab, presence of viral mutation(s) within the  areas targeted by this assay, and inadequate number of viral copies  (<250 copies / mL). A negative result must be combined with clinical  observations, patient history, and epidemiological information. If result is POSITIVE SARS-CoV-2 target nucleic acids are DETECTED. The SARS-CoV-2 RNA is generally detectable in upper and lower  respiratory specimens dur ing the acute phase of infection.  Positive  results are indicative of active infection with SARS-CoV-2.  Clinical  correlation with patient history and other diagnostic information is  necessary to determine patient infection status.  Positive results do  not rule out bacterial infection or co-infection with other viruses. If result is PRESUMPTIVE POSTIVE SARS-CoV-2 nucleic acids MAY BE PRESENT.   A presumptive positive result was obtained on the submitted specimen  and confirmed on repeat testing.  While 2019 novel coronavirus  (SARS-CoV-2) nucleic acids may be present in the submitted sample  additional confirmatory testing may be necessary for epidemiological  and / or clinical management purposes  to differentiate between  SARS-CoV-2 and other Sarbecovirus currently known to  infect humans.  If clinically indicated additional testing with an alternate test  methodology [phone]) is advised. The SARS-CoV-2 RNA is generally  detectable in upper and lower respiratory sp ecimens during the acute  phase of infection. The expected result is Negative. Fact Sheet for Patients:  StrictlyIdeas.no Fact Sheet for Healthcare Providers: BankingDealers.co.za This test is not yet approved or cleared by the [place] FDA and has been authorized for detection and/or diagnosis of SARS-CoV-2 by FDA under an Emergency Use Authorization (EUA).  This EUA will remain in effect (meaning this test can be used) for the duration of the COVID-19 declaration under Section 564(b)(1) of the Act, 21 U.S.C. section 360bbb-3(b)(1), unless the authorization is terminated or revoked sooner. Performed at [facility], [address] [place], [place] [address]   Urine Culture     Status: Abnormal   Collection Time: 07/13/18  2:32 PM  Result Value Ref Range Status   Specimen Description   Final    URINE, CLEAN CATCH Performed at [facility], [address] [place], [place] [address]    Special Requests   Final    NONE Performed at [facility], [place], [place] [address]    Culture (A)  Final    >=100,000 COLONIES/mL ESCHERICHIA COLI 20,000 COLONIES/mL KLEBSIELLA OXYTOCA    Report Status 07/18/2018 FINAL  Final   Organism ID, Bacteria ESCHERICHIA COLI (A)  Final   Organism ID, Bacteria KLEBSIELLA OXYTOCA (A)  Final      Susceptibility   Escherichia coli - MIC*    AMPICILLIN >=32 RESISTANT Resistant     CEFAZOLIN <=4 SENSITIVE Sensitive     CEFTRIAXONE <=1 SENSITIVE Sensitive     CIPROFLOXACIN 1 SENSITIVE Sensitive     GENTAMICIN <=1 SENSITIVE Sensitive     IMIPENEM <=0.25 SENSITIVE Sensitive     NITROFURANTOIN <=16 SENSITIVE Sensitive     TRIMETH/SULFA >=320 RESISTANT Resistant      AMPICILLIN/SULBACTAM 16 INTERMEDIATE Intermediate     PIP/TAZO <=4 SENSITIVE Sensitive     Extended ESBL NEGATIVE Sensitive     * >=100,000 COLONIES/mL ESCHERICHIA COLI   Klebsiella oxytoca - MIC*    AMPICILLIN >=32 RESISTANT Resistant     CEFAZOLIN 16 SENSITIVE Sensitive     CEFTRIAXONE <=1 SENSITIVE Sensitive     CIPROFLOXACIN <=0.25 SENSITIVE Sensitive     GENTAMICIN <=1 SENSITIVE Sensitive     IMIPENEM <=0.25 SENSITIVE Sensitive     NITROFURANTOIN <=16 SENSITIVE Sensitive     TRIMETH/SULFA <=20 SENSITIVE Sensitive     AMPICILLIN/SULBACTAM >=32 RESISTANT Resistant     PIP/TAZO 16 SENSITIVE Sensitive     Extended ESBL NEGATIVE Sensitive     * 20,000 COLONIES/mL KLEBSIELLA OXYTOCA    Coagulation Studies: No results for input(s): LABPROT, INR in the last 72 hours.  Imaging: No results found.  Medications:  I have reviewed the patient's current medications. Scheduled: . aspirin  81 mg Per Tube Daily  . chlorhexidine gluconate (MEDLINE KIT)  15 mL Mouth Rinse BID  . Chlorhexidine Gluconate Cloth  6 each Topical Daily  . docusate  100 mg Per Tube BID  . enoxaparin (LOVENOX) injection  40 mg Subcutaneous Q12H  . famotidine  20 mg Oral Daily  . furosemide  80 mg Intravenous Q12H  . insulin aspart  0-15 Units Subcutaneous Q4H  . ipratropium-albuterol  3 mL Nebulization TID  . mouth rinse  15 mL Mouth Rinse 10 times per day  . pravastatin  20 mg Per Tube q1800  . sodium chloride flush  10-40 mL Intracatheter Q12H  . sodium chloride flush  3 mL Intravenous Q12H    Assessment/Plan: 78 year old female s/p arrest with unknown downtime.  Remains unresponsive despite discontinuation of sedation on morning of 5/30.  Head CT performed and although there may be some loss of differentiation of the deep gray matter there is not any clear evidence of diffuse anoxic brain injury.  MRI unable to be performed due to pacer.  Both EEG's performed while patient on sedation.  Patient initially with  myoclonus that has resolved.  Patient on Keppra.   The most concerning feature is patient's unequal and unreactive pupils.  Further testing likely necessary.  Patient to be seen at [facility] for additional evaluation at family request.     LOS: 7 days   Alexis Goodell, MD Neurology [phone] 07/18/2018  11:01 AM

## 2018-07-18 NOTE — Consult Note (Signed)
PHARMACY CONSULT NOTE - FOLLOW UP  Pharmacy Consult for Electrolyte Monitoring and Replacement   Recent Labs: Potassium (mmol/L)  Date Value  07/18/2018 3.4 (L)  07/07/2013 3.5   Magnesium (mg/dL)  Date Value  93/96/8864 2.6 (H)   Calcium (mg/dL)  Date Value  84/72/0721 8.6 (L)   Calcium, Total (mg/dL)  Date Value  82/88/3374 9.7   Albumin (g/dL)  Date Value  45/14/6047 3.0 (L)  07/06/2013 4.0   Phosphorus (mg/dL)  Date Value  99/87/2158 2.9   Sodium (mmol/L)  Date Value  07/18/2018 145  11/15/2014 139  07/06/2013 138     Assessment: Patient coded on 08/03/22, now code ice. Rewarming phase planned to start 5/29 afternoon.  Goal of Therapy:  Electrolytes wnl's  Plan:  06/01 @ 0331 K 3.4. Will replace w/ KCl 10 mEq IV x 4 via central line and will recheck electrolytes w/ am labs. All other electrolytes WNL.  Thomasene Ripple, PharmD Clinical Pharmacist 07/18/2018 5:48 AM

## 2018-07-19 LAB — BASIC METABOLIC PANEL
Anion gap: 13 (ref 5–15)
BUN: 33 mg/dL — ABNORMAL HIGH (ref 8–23)
CO2: 35 mmol/L — ABNORMAL HIGH (ref 22–32)
Calcium: 8.6 mg/dL — ABNORMAL LOW (ref 8.9–10.3)
Chloride: 99 mmol/L (ref 98–111)
Creatinine, Ser: 0.54 mg/dL (ref 0.44–1.00)
GFR calc Af Amer: >60 mL/min (ref >60)
GFR calc non Af Amer: >60 mL/min (ref >60)
Glucose, Bld: 118 mg/dL — ABNORMAL HIGH (ref 70–99)
Potassium: 2.7 mmol/L — CL (ref 3.5–5.1)
Sodium: 147 mmol/L — ABNORMAL HIGH (ref 135–145)

## 2018-07-19 LAB — VALPROIC ACID LEVEL: Valproic Acid Lvl: 91 ug/mL (ref 50.0–100.0)

## 2018-07-19 LAB — GLUCOSE, CAPILLARY
Glucose-Capillary: 111 mg/dL — ABNORMAL HIGH (ref 70–99)
Glucose-Capillary: 123 mg/dL — ABNORMAL HIGH (ref 70–99)
Glucose-Capillary: 129 mg/dL — ABNORMAL HIGH (ref 70–99)
Glucose-Capillary: 88 mg/dL (ref 70–99)
Glucose-Capillary: 91 mg/dL (ref 70–99)
Glucose-Capillary: 94 mg/dL (ref 70–99)

## 2018-07-19 LAB — PHOSPHORUS: Phosphorus: 3.3 mg/dL (ref 2.5–4.6)

## 2018-07-19 LAB — POTASSIUM: Potassium: 4.4 mmol/L (ref 3.5–5.1)

## 2018-07-19 LAB — MAGNESIUM: Magnesium: 2.5 mg/dL — ABNORMAL HIGH (ref 1.7–2.4)

## 2018-07-19 MED ORDER — MIDAZOLAM HCL 2 MG/2ML IJ SOLN
2.0000 mg | INTRAMUSCULAR | Status: DC | PRN
  Administered 2018-07-19 – 2018-07-20 (×2): 2 mg via INTRAVENOUS
  Filled 2018-07-19 (×2): qty 2

## 2018-07-19 MED ORDER — FAMOTIDINE 20 MG PO TABS
20.0000 mg | ORAL_TABLET | Freq: Every day | ORAL | Status: DC
  Administered 2018-07-20: 20 mg
  Filled 2018-07-19: qty 1

## 2018-07-19 MED ORDER — POTASSIUM CHLORIDE 20 MEQ/15ML (10%) PO SOLN
40.0000 meq | ORAL | Status: DC
  Administered 2018-07-19: 40 meq
  Filled 2018-07-19 (×2): qty 30

## 2018-07-19 MED ORDER — FUROSEMIDE 10 MG/ML IJ SOLN
40.0000 mg | Freq: Every day | INTRAMUSCULAR | Status: DC
  Administered 2018-07-20: 40 mg via INTRAVENOUS
  Filled 2018-07-19: qty 4

## 2018-07-19 MED ORDER — LEVETIRACETAM IN NACL 500 MG/100ML IV SOLN
500.0000 mg | Freq: Two times a day (BID) | INTRAVENOUS | Status: DC
  Administered 2018-07-19: 500 mg via INTRAVENOUS
  Filled 2018-07-19 (×2): qty 100

## 2018-07-19 MED ORDER — POTASSIUM CHLORIDE 20 MEQ PO PACK
40.0000 meq | PACK | ORAL | Status: AC
  Administered 2018-07-19 (×2): 40 meq
  Filled 2018-07-19 (×2): qty 2

## 2018-07-19 MED ORDER — LEVETIRACETAM IN NACL 500 MG/100ML IV SOLN
500.0000 mg | Freq: Two times a day (BID) | INTRAVENOUS | Status: DC
  Filled 2018-07-19 (×2): qty 100

## 2018-07-19 MED ORDER — LEVETIRACETAM IN NACL 1000 MG/100ML IV SOLN
1000.0000 mg | Freq: Two times a day (BID) | INTRAVENOUS | Status: DC
  Administered 2018-07-19 (×2): 1000 mg via INTRAVENOUS
  Filled 2018-07-19 (×3): qty 100

## 2018-07-19 MED ORDER — LEVETIRACETAM IN NACL 1500 MG/100ML IV SOLN
1500.0000 mg | Freq: Two times a day (BID) | INTRAVENOUS | Status: DC
  Administered 2018-07-20: 1500 mg via INTRAVENOUS
  Filled 2018-07-19 (×3): qty 100

## 2018-07-19 NOTE — Progress Notes (Signed)
CRITICAL CARE NOTE  CC  follow up respiratory failure  SUBJECTIVE Patient remains critically ill Prognosis is guarded Unequal and reactive pupils Signs of anoxic brain injury Prognosis is very poor   BP (!) 120/58   Pulse 63   Temp (!) 94.1 F (34.5 C)   Resp 12   Ht 5' (1.524 m)   Wt 92.9 kg   SpO2 95%   BMI 40.00 kg/m    I/O last 3 completed shifts: In: 718.1 [IV Piggyback:718.1] Out: 2865 [Urine:2865] No intake/output data recorded.  SpO2: 95 % O2 Flow Rate (L/min): 2 L/min FiO2 (%): 24 %     REVIEW OF SYSTEMS  PATIENT IS UNABLE TO PROVIDE COMPLETE REVIEW OF SYSTEMS DUE TO SEVERE CRITICAL ILLNESS   PHYSICAL EXAMINATION:  GENERAL:critically ill appearing, +resp distress HEAD: Normocephalic, atraumatic.  EYES: Pupils equal, round, reactive to light.  No scleral icterus.  MOUTH: Moist mucosal membrane. NECK: Supple. No thyromegaly. No nodules. No JVD.  PULMONARY: +rhonchi, +wheezing CARDIOVASCULAR: S1 and S2. Regular rate and rhythm. No murmurs, rubs, or gallops.  GASTROINTESTINAL: Soft, nontender, -distended. No masses. Positive bowel sounds. No hepatosplenomegaly.  MUSCULOSKELETAL: No swelling, clubbing, or edema.  NEUROLOGIC: obtunded, GCS<8 SKIN:intact,warm,dry  MEDICATIONS: I have reviewed all medications and confirmed regimen as documented   CULTURE RESULTS   Recent Results (from the past 240 hour(s))  SARS Coronavirus 2 (CEPHEID- Performed in [facility] hospital lab), Hosp Order     Status: None   Collection Time: 07/11/18 10:19 AM  Result Value Ref Range Status   SARS Coronavirus 2 NEGATIVE NEGATIVE Final    Comment: (NOTE) If result is NEGATIVE SARS-CoV-2 target nucleic acids are NOT DETECTED. The SARS-CoV-2 RNA is generally detectable in upper and lower  respiratory specimens during the acute phase of infection. The lowest  concentration of SARS-CoV-2 viral copies this assay can detect is 250  copies / mL. A negative result does not  preclude SARS-CoV-2 infection  and should not be used as the sole basis for treatment or other  patient management decisions.  A negative result may occur with  improper specimen collection / handling, submission of specimen other  than nasopharyngeal swab, presence of viral mutation(s) within the  areas targeted by this assay, and inadequate number of viral copies  (<250 copies / mL). A negative result must be combined with clinical  observations, patient history, and epidemiological information. If result is POSITIVE SARS-CoV-2 target nucleic acids are DETECTED. The SARS-CoV-2 RNA is generally detectable in upper and lower  respiratory specimens dur ing the acute phase of infection.  Positive  results are indicative of active infection with SARS-CoV-2.  Clinical  correlation with patient history and other diagnostic information is  necessary to determine patient infection status.  Positive results do  not rule out bacterial infection or co-infection with other viruses. If result is PRESUMPTIVE POSTIVE SARS-CoV-2 nucleic acids MAY BE PRESENT.   A presumptive positive result was obtained on the submitted specimen  and confirmed on repeat testing.  While 2019 novel coronavirus  (SARS-CoV-2) nucleic acids may be present in the submitted sample  additional confirmatory testing may be necessary for epidemiological  and / or clinical management purposes  to differentiate between  SARS-CoV-2 and other Sarbecovirus currently known to infect humans.  If clinically indicated additional testing with an alternate test  methodology [phone]) is advised. The SARS-CoV-2 RNA is generally  detectable in upper and lower respiratory sp ecimens during the acute  phase of infection. The expected result  is Negative. Fact Sheet for Patients:  BoilerBrush.com.cyhttps://www.fda.gov/media/136312/download Fact Sheet for Healthcare Providers: https://pope.com/https://www.fda.gov/media/136313/download This test is not yet approved or cleared by  the [place] FDA and has been authorized for detection and/or diagnosis of SARS-CoV-2 by FDA under an Emergency Use Authorization (EUA).  This EUA will remain in effect (meaning this test can be used) for the duration of the COVID-19 declaration under Section 564(b)(1) of the Act, 21 U.S.C. section 360bbb-3(b)(1), unless the authorization is terminated or revoked sooner. Performed at [facility], [address] [place], [place] [address]   Urine Culture     Status: Abnormal   Collection Time: 07/13/18  2:32 PM  Result Value Ref Range Status   Specimen Description   Final    URINE, CLEAN CATCH Performed at [facility], [address] [place], [place] [address]    Special Requests   Final    NONE Performed at [facility], [address] [place], [place] [address]    Culture (A)  Final    >=100,000 COLONIES/mL ESCHERICHIA COLI 20,000 COLONIES/mL KLEBSIELLA OXYTOCA    Report Status 07/18/2018 FINAL  Final   Organism ID, Bacteria ESCHERICHIA COLI (A)  Final   Organism ID, Bacteria KLEBSIELLA OXYTOCA (A)  Final      Susceptibility   Escherichia coli - MIC*    AMPICILLIN >=32 RESISTANT Resistant     CEFAZOLIN <=4 SENSITIVE Sensitive     CEFTRIAXONE <=1 SENSITIVE Sensitive     CIPROFLOXACIN 1 SENSITIVE Sensitive     GENTAMICIN <=1 SENSITIVE Sensitive     IMIPENEM <=0.25 SENSITIVE Sensitive     NITROFURANTOIN <=16 SENSITIVE Sensitive     TRIMETH/SULFA >=320 RESISTANT Resistant     AMPICILLIN/SULBACTAM 16 INTERMEDIATE Intermediate     PIP/TAZO <=4 SENSITIVE Sensitive     Extended ESBL NEGATIVE Sensitive     * >=100,000 COLONIES/mL ESCHERICHIA COLI   Klebsiella oxytoca - MIC*    AMPICILLIN >=32 RESISTANT Resistant     CEFAZOLIN 16 SENSITIVE Sensitive     CEFTRIAXONE <=1 SENSITIVE Sensitive     CIPROFLOXACIN <=0.25 SENSITIVE Sensitive     GENTAMICIN <=1 SENSITIVE Sensitive     IMIPENEM <=0.25 SENSITIVE Sensitive     NITROFURANTOIN <=16  SENSITIVE Sensitive     TRIMETH/SULFA <=20 SENSITIVE Sensitive     AMPICILLIN/SULBACTAM >=32 RESISTANT Resistant     PIP/TAZO 16 SENSITIVE Sensitive     Extended ESBL NEGATIVE Sensitive     * 20,000 COLONIES/mL KLEBSIELLA OXYTOCA          IMAGING    No results found.     Indwelling Urinary Catheter continued, requirement due to   Reason to continue Indwelling Urinary Catheter strict Intake/Output monitoring for hemodynamic instability   Central Line/ continued, requirement due to  Reason to continue [employer] Line Monitoring of central venous pressure or other hemodynamic parameters and poor IV access   Ventilator continued, requirement due to severe respiratory failure   Ventilator Sedation RASS 0 to -2      ASSESSMENT AND PLAN SYNOPSIS  78 year old African-American female with unknown downtime with severe cardiac arrest respiratory failure with signs symptoms of  anoxic brain injury Status post hypothermia protocol  Severe ACUTE Hypoxic and Hypercapnic Respiratory Failure -continue Full MV support -continue Bronchodilator Therapy -Wean Fio2 and PEEP as tolerated -unable to wean due to severe neurological dysfunction  ACUTE cardiac arrest- -oxygen as needed -follow up cardiac enzymes as indicated    NEUROLOGY Signs  anoxic brain injury Family requesting  transfer to [facility] for second opinion    CARDIAC ICU monitoring  ID -continue IV abx as prescibed -follow up cultures  GI GI PROPHYLAXIS as indicated  NUTRITIONAL STATUS DIET-->TF's as tolerated Constipation protocol as indicated  ENDO - will use ICU hypoglycemic\Hyperglycemia protocol if indicated   ELECTROLYTES -follow labs as needed -replace as needed -pharmacy consultation and following   DVT/GI PRX ordered TRANSFUSIONS AS NEEDED MONITOR FSBS ASSESS the need for LABS as needed   Critical Care Time devoted to patient care services described in this note is 36 minutes.   Overall,  patient is critically ill, prognosis is guarded.  Patient with Multiorgan failure and at high risk for cardiac arrest and death.    Lucie Leather, M.D.  [facility]  Medical Director [facility] Medical Director [facility] Cardio-Pulmonary Department

## 2018-07-19 NOTE — Progress Notes (Signed)
[facility] [place] [facility]   PATIENT NAME: Tiffany Barber    MR#:  [id number]  DATE OF BIRTH:  09/19/1940  SUBJECTIVE:  Patient seen and evaluated today Currently on ventilator Ventilator settings Tidal volume 500 Rate 12 PEEP 5 FiO2 24 %  REVIEW OF SYSTEMS:    Unable to obtain as patient is sedated on vent   Tolerating Diet:npo  DRUG ALLERGIES:   Allergies  Allergen Reactions  . Ace Inhibitors Other (See Comments)    Other reaction(s): angioedema resulting in intubation Other reaction(s): angioedema resulting in intubation   . Nifedipine Swelling and Shortness Of Breath  . Lisinopril     VITALS:  Blood pressure (!) 92/50, pulse 61, temperature (!) 94.3 F (34.6 C), resp. rate 12, height 5' (1.524 m), weight 92.9 kg, SpO2 95 %.  PHYSICAL EXAMINATION:  Constitutional: Intubated and sedated on ventilator  HENT: Normocephalic. . Intubated  eyes: Conjunctivae and EOM are normal. PERRLA, no scleral icterus.  Neck:  No JVD. No tracheal deviation. CVS: RRR, S1/S2 +, no murmurs, no gallops, no carotid bruit.  Pulmonary: Patient with bilateral crackles at bases Abdominal: Soft. BS +,  no distension, tenderness, rebound or guarding.  Musculoskeletal: 1+ lower extremity edema Neuro: on ventilator Skin: Skin is warm and dry. No rash noted. Psychiatric: Sedated  LABORATORY PANEL:   CBC Recent Labs  Lab 07/18/18 0331  WBC 14.5*  HGB 9.1*  HCT 29.3*  PLT 461*   ------------------------------------------------------------------------------------------------------------------  Chemistries  Recent Labs  Lab 07/15/18 0330  07/19/18 0553  NA  --    < > 147*  K  --    < > 2.7*  CL  --    < > 99  CO2  --    < > 35*  GLUCOSE  --    < > 118*  BUN  --    < > 33*  CREATININE  --    < > 0.54  CALCIUM  --    < > 8.6*  MG 2.3   < > 2.5*  AST 141*  --   --   ALT 139*  --   --   ALKPHOS 135*  --   --   BILITOT 1.2  --   --    < > = values in  this interval not displayed.   ------------------------------------------------------------------------------------------------------------------  Cardiac Enzymes Recent Labs  Lab 07/15/18 0142 07/15/18 0749 07/15/18 1314  TROPONINI 0.63* 0.54* 0.45*   ------------------------------------------------------------------------------------------------------------------  RADIOLOGY:  No results found.   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  1.  Acute hypoxic respiratory failure due to acute on chronic diastolic heart failure status post mechanical intubation and status post PEA arrest Continue full vent support Continue vent bundle Management as per intensivist Continue Lasix Levophed to keep map greater than 65 as needed  2.  PEA arrest of unclear etiology with early signs of anoxic brain injury Patient evaluated by cardiology.. 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  Hypokalemia: Replete PRN  4.  Hyponatremia in setting of CHF: Continue to monitor  5.Transfer to [facility]  once bed is available  Management plans discussed with the patient's son yesterday  CODE STATUS: full  TOTAL TIME TAKING CARE OF THIS PATIENT: 37 minutes.   Consider PC consult if no improvement in next few days  POSSIBLE D/C ??, DEPENDING ON CLINICAL CONDITION.   Ihor AustinPavan Abou Sterkel M.D on 07/19/2018 at 10:43 AM  Between 7am to 6pm - Pager - [phone] After 6pm go to [email or web address] - password [email or web address]  [facility]  Office  [phone]  CC: Primary care physician;  Emi BelfastGessner, Deborah B, FNP  Note: This dictation was prepared with Dragon dictation along with smaller phrase technology. Any transcriptional errors that result from this process are unintentional.

## 2018-07-19 NOTE — Consult Note (Signed)
PHARMACY CONSULT NOTE - FOLLOW UP  Pharmacy Consult for Electrolyte Monitoring and Replacement   Recent Labs: Potassium (mmol/L)  Date Value  07/19/2018 4.4  07/07/2013 3.5   Magnesium (mg/dL)  Date Value  68/04/2120 2.5 (H)   Calcium (mg/dL)  Date Value  48/25/0037 8.6 (L)   Calcium, Total (mg/dL)  Date Value  04/88/8916 9.7   Albumin (g/dL)  Date Value  94/50/3888 3.0 (L)  07/06/2013 4.0   Phosphorus (mg/dL)  Date Value  28/00/3491 3.3   Sodium (mmol/L)  Date Value  07/19/2018 147 (H)  11/15/2014 139  07/06/2013 138   Assessment: Patient coded on 2022/08/04, now code ice. Acute hypoxic respiratory failure due to acute on chronic diastolic heart failure status post mechanical intubation and status post PEA arrest. The plan is for her to transfer to [facility] for further care  Goal of Therapy:  Magnesium~2, potassium~4  Plan:  Patient received potassium x 3 doses this am. Potassium is now 4.4. No further replacement warranted.   Pharmacy will continue to monitor and adjust per consult.   Brenyn Petrey L, RPh 07/19/2018 6:02 PM

## 2018-07-19 NOTE — Progress Notes (Signed)
Subjective: Patient remains unresponsive.  Intubated.  No sedation.    Objective: Current vital signs: BP (!) 92/50   Pulse 61   Temp (!) 94.3 F (34.6 C)   Resp 12   Ht 5' (1.524 m)   Wt 92.9 kg   SpO2 95%   BMI 40.00 kg/m  Vital signs in last 24 hours: Temp:  [94.1 F (34.5 C)-98.6 F (37 C)] 94.3 F (34.6 C) (06/02 0900) Pulse Rate:  [59-81] 61 (06/02 0900) Resp:  [12-18] 12 (06/02 0900) BP: (85-188)/(45-97) 92/50 (06/02 0900) SpO2:  [93 %-99 %] 95 % (06/02 0900) FiO2 (%):  [24 %-30 %] 24 % (06/02 0806) Weight:  [92.9 kg] 92.9 kg (06/02 0500)  Intake/Output from previous day: 06/01 0701 - 06/02 0700 In: 123.5 [IV Piggyback:123.5] Out: 1375 [Urine:1375] Intake/Output this shift: Total I/O In: -  Out: 75 [Urine:75] Nutritional status:  Diet Order    None      Neurologic Exam: Mental Status: Patient does not respond to verbal stimuli.  Does not respond to deep sternal rub.  Does not follow commands.  No verbalizations noted.  Cranial Nerves: II: patient does not respond confrontation bilaterally, pupils right 2 mm, left 4 mm,and unreactive bilaterally III,IV,VI: doll's response absent bilaterally. Cold caloric responses absent bilaterally. V,VII: corneal reflex absent bilaterally  VIII: patient does not respond to verbal stimuli IX,X: gag reflex absent, XI: trapezius strength unable to test bilaterally XII: tongue strength unable to test Motor: Extremities flaccid throughout.  No spontaneous movement noted.  No purposeful movements noted. Sensory: Does not respond to noxious stimuli in any extremity. Deep Tendon Reflexes:  Absent throughout. Plantars: Absent bilaterally Cerebellar: Unable to perform   Lab Results: Basic Metabolic Panel: Recent Labs  Lab 07/14/18 1506  07/15/18 0749  07/15/18 2310 07/16/18 0242 07/16/18 0455 07/16/18 1632 07/17/18 0432 07/17/18 1754 07/18/18 0331 07/19/18 0553 07/19/18 0821  NA 131*   < >  --    < >  --   137 140  --  145  --  145 147*  --   K 3.9   < >  --    < > 2.9* 3.3* 3.1* 3.2* 3.1* 2.9* 3.4* 2.7*  --   CL 88*   < >  --    < >  --  93* 95*  --  98  --  99 99  --   CO2 25   < >  --    < >  --  33* 32  --  33*  --  35* 35*  --   GLUCOSE 165*   < >  --    < >  --  116* 122*  --  108*  --  118* 118*  --   BUN 58*   < >  --    < >  --  46* 45*  --  44*  --  38* 33*  --   CREATININE 1.21*   < >  --    < >  --  0.85 0.80  --  0.76  --  0.69 0.54  --   CALCIUM 9.1   < >  --    < >  --  8.2* 8.4*  --  8.5*  --  8.6* 8.6*  --   MG 2.8*   < > 2.3  --  2.8*  --   --   --  2.8*  --  2.6* 2.5*  --   PHOS 6.9*  --   --   --   --   --   --   --  3.4  --  2.9  --  3.3   < > = values in this interval not displayed.    Liver Function Tests: Recent Labs  Lab 07/14/18 1506 07/15/18 0330  AST 199* 141*  ALT 156* 139*  ALKPHOS 151* 135*  BILITOT 1.0 1.2  PROT 6.3* 6.1*  ALBUMIN 3.1* 3.0*   No results for input(s): LIPASE, AMYLASE in the last 168 hours. No results for input(s): AMMONIA in the last 168 hours.  CBC: Recent Labs  Lab 07/14/18 1506 07/15/18 0330 07/16/18 0455 07/17/18 0547 07/18/18 0331  WBC 19.3* 21.4* 21.8* 16.9* 14.5*  NEUTROABS 17.2*  --  19.3*  --   --   HGB 9.3* 8.7* 8.7* 8.6* 9.1*  HCT 29.6* 26.4* 28.2* 27.6* 29.3*  MCV 99.3 93.0 97.2 97.5 97.7  PLT 481* 495* 535* 441* 461*    Cardiac Enzymes: Recent Labs  Lab 07/14/18 1506 07/14/18 2017 07/15/18 0142 07/15/18 0749 07/15/18 1314  TROPONINI 0.19* 0.42* 0.63* 0.54* 0.45*    Lipid Panel: Recent Labs  Lab 07/15/18 0330 07/16/18 0455  TRIG 111 160*    CBG: Recent Labs  Lab 07/18/18 1928 07/18/18 2014 07/19/18 0007 07/19/18 0432 07/19/18 0739  GLUCAP 100* 107* 129* 88 111*    Microbiology: Results for orders placed or performed during the hospital encounter of 07/11/18  SARS Coronavirus 2 (CEPHEID- Performed in [facility] hospital lab), Hosp Order     Status: None   Collection Time: 07/11/18  10:19 AM  Result Value Ref Range Status   SARS Coronavirus 2 NEGATIVE NEGATIVE Final    Comment: (NOTE) If result is NEGATIVE SARS-CoV-2 target nucleic acids are NOT DETECTED. The SARS-CoV-2 RNA is generally detectable in upper and lower  respiratory specimens during the acute phase of infection. The lowest  concentration of SARS-CoV-2 viral copies this assay can detect is 250  copies / mL. A negative result does not preclude SARS-CoV-2 infection  and should not be used as the sole basis for treatment or other  patient management decisions.  A negative result may occur with  improper specimen collection / handling, submission of specimen other  than nasopharyngeal swab, presence of viral mutation(s) within the  areas targeted by this assay, and inadequate number of viral copies  (<250 copies / mL). A negative result must be combined with clinical  observations, patient history, and epidemiological information. If result is POSITIVE SARS-CoV-2 target nucleic acids are DETECTED. The SARS-CoV-2 RNA is generally detectable in upper and lower  respiratory specimens dur ing the acute phase of infection.  Positive  results are indicative of active infection with SARS-CoV-2.  Clinical  correlation with patient history and other diagnostic information is  necessary to determine patient infection status.  Positive results do  not rule out bacterial infection or co-infection with other viruses. If result is PRESUMPTIVE POSTIVE SARS-CoV-2 nucleic acids MAY BE PRESENT.   A presumptive positive result was obtained on the submitted specimen  and confirmed on repeat testing.  While 2019 novel coronavirus  (SARS-CoV-2) nucleic acids may be present in the submitted sample  additional confirmatory testing may be necessary for epidemiological  and / or clinical management purposes  to differentiate between  SARS-CoV-2 and other Sarbecovirus currently known to infect humans.  If clinically indicated  additional testing with an alternate test  methodology [phone]) is advised. The SARS-CoV-2 RNA is generally  detectable in upper and lower respiratory sp ecimens during the acute  phase of infection. The expected result is Negative.  Fact Sheet for Patients:  StrictlyIdeas.no Fact Sheet for Healthcare Providers: BankingDealers.co.za This test is not yet approved or cleared by the [place] FDA and has been authorized for detection and/or diagnosis of SARS-CoV-2 by FDA under an Emergency Use Authorization (EUA).  This EUA will remain in effect (meaning this test can be used) for the duration of the COVID-19 declaration under Section 564(b)(1) of the Act, 21 U.S.C. section 360bbb-3(b)(1), unless the authorization is terminated or revoked sooner. Performed at [facility], [address] [place], [place] [address]   Urine Culture     Status: Abnormal   Collection Time: 07/13/18  2:32 PM  Result Value Ref Range Status   Specimen Description   Final    URINE, CLEAN CATCH Performed at [facility], [address] [place], [place] [address]    Special Requests   Final    NONE Performed at [facility], [place], [place] [address]    Culture (A)  Final    >=100,000 COLONIES/mL ESCHERICHIA COLI 20,000 COLONIES/mL KLEBSIELLA OXYTOCA    Report Status 07/18/2018 FINAL  Final   Organism ID, Bacteria ESCHERICHIA COLI (A)  Final   Organism ID, Bacteria KLEBSIELLA OXYTOCA (A)  Final      Susceptibility   Escherichia coli - MIC*    AMPICILLIN >=32 RESISTANT Resistant     CEFAZOLIN <=4 SENSITIVE Sensitive     CEFTRIAXONE <=1 SENSITIVE Sensitive     CIPROFLOXACIN 1 SENSITIVE Sensitive     GENTAMICIN <=1 SENSITIVE Sensitive     IMIPENEM <=0.25 SENSITIVE Sensitive     NITROFURANTOIN <=16 SENSITIVE Sensitive     TRIMETH/SULFA >=320 RESISTANT Resistant     AMPICILLIN/SULBACTAM 16 INTERMEDIATE  Intermediate     PIP/TAZO <=4 SENSITIVE Sensitive     Extended ESBL NEGATIVE Sensitive     * >=100,000 COLONIES/mL ESCHERICHIA COLI   Klebsiella oxytoca - MIC*    AMPICILLIN >=32 RESISTANT Resistant     CEFAZOLIN 16 SENSITIVE Sensitive     CEFTRIAXONE <=1 SENSITIVE Sensitive     CIPROFLOXACIN <=0.25 SENSITIVE Sensitive     GENTAMICIN <=1 SENSITIVE Sensitive     IMIPENEM <=0.25 SENSITIVE Sensitive     NITROFURANTOIN <=16 SENSITIVE Sensitive     TRIMETH/SULFA <=20 SENSITIVE Sensitive     AMPICILLIN/SULBACTAM >=32 RESISTANT Resistant     PIP/TAZO 16 SENSITIVE Sensitive     Extended ESBL NEGATIVE Sensitive     * 20,000 COLONIES/mL KLEBSIELLA OXYTOCA    Coagulation Studies: No results for input(s): LABPROT, INR in the last 72 hours.  Imaging: No results found.  Medications:  I have reviewed the patient's current medications. Scheduled: . aspirin  81 mg Per Tube Daily  . chlorhexidine gluconate (MEDLINE KIT)  15 mL Mouth Rinse BID  . Chlorhexidine Gluconate Cloth  6 each Topical Daily  . docusate  100 mg Per Tube BID  . enoxaparin (LOVENOX) injection  40 mg Subcutaneous Q24H  . [START ON 07/20/2018] famotidine  20 mg Per Tube Daily  . [START ON 07/20/2018] furosemide  40 mg Intravenous Daily  . insulin aspart  0-15 Units Subcutaneous Q4H  . ipratropium-albuterol  3 mL Nebulization TID  . mouth rinse  15 mL Mouth Rinse 10 times per day  . potassium chloride  40 mEq Per Tube Q4H  . pravastatin  20 mg Per Tube q1800  . sodium chloride flush  10-40 mL Intracatheter Q12H  . sodium chloride flush  3 mL Intravenous Q12H  Assessment/Plan: Patient continues to fulfill clinical criteria for brain death with aside from neurological examination patient also not breathing above the ventilator settings. Will obtain more supportive evidence since patient did undergo hypothermia protocol.   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  Recommendations: 1.  Increase Keppra to 1055m q 12 hours.  First dose now.   2.  Continue Depakote at current dose with stat level to be drawn 3.  EEG 4.  Once patient more stable will repeat head CT without contrast  Case discussed with Dr. KTilman Neat MD Neurology [phone]/03/2018  1:09 PM

## 2018-07-19 NOTE — Consult Note (Signed)
PHARMACY CONSULT NOTE - FOLLOW UP  Pharmacy Consult for Electrolyte Monitoring and Replacement   Recent Labs: Potassium (mmol/L)  Date Value  07/19/2018 2.7 (LL)  07/07/2013 3.5   Magnesium (mg/dL)  Date Value  62/26/3335 2.5 (H)   Calcium (mg/dL)  Date Value  45/62/5638 8.6 (L)   Calcium, Total (mg/dL)  Date Value  93/73/4287 9.7   Albumin (g/dL)  Date Value  68/12/5724 3.0 (L)  07/06/2013 4.0   Phosphorus (mg/dL)  Date Value  20/35/5974 3.3   Sodium (mmol/L)  Date Value  07/19/2018 147 (H)  11/15/2014 139  07/06/2013 138   Assessment: Patient coded on Aug 04, 2022, now code ice. Acute hypoxic respiratory failure due to acute on chronic diastolic heart failure status post mechanical intubation and status post PEA arrest. The plan is for her to transfer to [facility] for further care  Goal of Therapy:  Magnesium~2, potassium~4  Plan:  --replace potassium with w/ oral KCl 40 mEq x 3  --recheck electrolytes at 1800 --all other electrolytes WNL.  Lowella Bandy, PharmD Clinical Pharmacist 07/19/2018 1:34 PM

## 2018-07-20 ENCOUNTER — Other Ambulatory Visit: Payer: Medicare Other

## 2018-07-20 DIAGNOSIS — Z515 Encounter for palliative care: Secondary | ICD-10-CM

## 2018-07-20 DIAGNOSIS — I5031 Acute diastolic (congestive) heart failure: Secondary | ICD-10-CM

## 2018-07-20 DIAGNOSIS — Z7189 Other specified counseling: Secondary | ICD-10-CM

## 2018-07-20 DIAGNOSIS — Z978 Presence of other specified devices: Secondary | ICD-10-CM

## 2018-07-20 DIAGNOSIS — G939 Disorder of brain, unspecified: Secondary | ICD-10-CM

## 2018-07-20 DIAGNOSIS — R0989 Other specified symptoms and signs involving the circulatory and respiratory systems: Secondary | ICD-10-CM

## 2018-07-20 DIAGNOSIS — R569 Unspecified convulsions: Secondary | ICD-10-CM

## 2018-07-20 LAB — BASIC METABOLIC PANEL
Anion gap: 9 (ref 5–15)
BUN: 35 mg/dL — ABNORMAL HIGH (ref 8–23)
CO2: 34 mmol/L — ABNORMAL HIGH (ref 22–32)
Calcium: 8.5 mg/dL — ABNORMAL LOW (ref 8.9–10.3)
Chloride: 106 mmol/L (ref 98–111)
Creatinine, Ser: 0.76 mg/dL (ref 0.44–1.00)
GFR calc Af Amer: >60 mL/min (ref >60)
GFR calc non Af Amer: >60 mL/min (ref >60)
Glucose, Bld: 104 mg/dL — ABNORMAL HIGH (ref 70–99)
Potassium: 4.2 mmol/L (ref 3.5–5.1)
Sodium: 149 mmol/L — ABNORMAL HIGH (ref 135–145)

## 2018-07-20 LAB — GLUCOSE, CAPILLARY
Glucose-Capillary: 86 mg/dL (ref 70–99)
Glucose-Capillary: 103 mg/dL — ABNORMAL HIGH (ref 70–99)
Glucose-Capillary: 86 mg/dL (ref 70–99)
Glucose-Capillary: 93 mg/dL (ref 70–99)

## 2018-07-20 LAB — CBC
HCT: 29.8 % — ABNORMAL LOW (ref 36.0–46.0)
Hemoglobin: 8.9 g/dL — ABNORMAL LOW (ref 12.0–15.0)
MCH: 30.2 pg (ref 26.0–34.0)
MCHC: 29.9 g/dL — ABNORMAL LOW (ref 30.0–36.0)
MCV: 101 fL — ABNORMAL HIGH (ref 80.0–100.0)
Platelets: 376 10*3/uL (ref 150–400)
RBC: 2.95 MIL/uL — ABNORMAL LOW (ref 3.87–5.11)
RDW: 14.8 % (ref 11.5–15.5)
WBC: 10.1 10*3/uL (ref 4.0–10.5)
nRBC: 0.2 % (ref 0.0–0.2)

## 2018-07-20 LAB — MAGNESIUM: Magnesium: 2.5 mg/dL — ABNORMAL HIGH (ref 1.7–2.4)

## 2018-07-20 MED ORDER — CHLORHEXIDINE GLUCONATE CLOTH 2 % EX PADS: 6.0000 | MEDICATED_PAD | Freq: Every day | CUTANEOUS | Status: AC

## 2018-07-20 MED ORDER — VITAL AF 1.2 CAL PO LIQD: 1000.0000 mL | ORAL | Status: DC

## 2018-07-20 MED ORDER — ADULT MULTIVITAMIN LIQUID CH: 15.0000 mL | Freq: Every day | ORAL | Status: AC

## 2018-07-20 MED ORDER — PRAVASTATIN SODIUM 20 MG PO TABS: 20.0000 mg | ORAL_TABLET | Freq: Every day | ORAL | Status: AC

## 2018-07-20 MED ORDER — FREE WATER
100.0000 mL | Status: DC
  Administered 2018-07-20: 100 mL

## 2018-07-20 MED ORDER — CHLORHEXIDINE GLUCONATE 0.12% ORAL RINSE (MEDLINE KIT): 15.0000 mL | Freq: Two times a day (BID) | OROMUCOSAL | 0 refills | Status: AC

## 2018-07-20 MED ORDER — LEVETIRACETAM IN NACL 1500 MG/100ML IV SOLN: 1500.0000 mg | Freq: Two times a day (BID) | INTRAVENOUS | Status: AC

## 2018-07-20 MED ORDER — ACETAMINOPHEN 325 MG PO TABS: 650.0000 mg | ORAL_TABLET | Freq: Four times a day (QID) | ORAL | Status: AC | PRN

## 2018-07-20 MED ORDER — ALBUTEROL SULFATE (2.5 MG/3ML) 0.083% IN NEBU: 2.5000 mg | INHALATION_SOLUTION | RESPIRATORY_TRACT | 12 refills | Status: AC | PRN

## 2018-07-20 MED ORDER — SODIUM CHLORIDE 0.9% FLUSH: 10.0000 mL | Freq: Two times a day (BID) | INTRAVENOUS | Status: AC

## 2018-07-20 MED ORDER — SODIUM CHLORIDE 0.9% FLUSH: 3.0000 mL | INTRAVENOUS | Status: AC | PRN

## 2018-07-20 MED ORDER — VITAL AF 1.2 CAL PO LIQD: 1000.0000 mL | ORAL | Status: AC

## 2018-07-20 MED ORDER — PRO-STAT SUGAR FREE PO LIQD: 30.0000 mL | Freq: Two times a day (BID) | ORAL | 0 refills | Status: AC

## 2018-07-20 MED ORDER — ASPIRIN 81 MG PO CHEW: 81.0000 mg | CHEWABLE_TABLET | Freq: Every day | ORAL | Status: AC

## 2018-07-20 MED ORDER — ONDANSETRON HCL 4 MG PO TABS: 4.0000 mg | ORAL_TABLET | Freq: Four times a day (QID) | ORAL | 0 refills | Status: AC | PRN

## 2018-07-20 MED ORDER — CEFAZOLIN SODIUM-DEXTROSE 1-4 GM/50ML-% IV SOLN: 1.0000 g | Freq: Three times a day (TID) | INTRAVENOUS | Status: AC

## 2018-07-20 MED ORDER — ACETAMINOPHEN 650 MG RE SUPP: 650.0000 mg | Freq: Four times a day (QID) | RECTAL | 0 refills | Status: AC | PRN

## 2018-07-20 MED ORDER — DOCUSATE SODIUM 50 MG/5ML PO LIQD: 100.0000 mg | Freq: Two times a day (BID) | ORAL | 0 refills | Status: AC

## 2018-07-20 MED ORDER — VITAL HIGH PROTEIN PO LIQD: 1000.0000 mL | ORAL | Status: DC

## 2018-07-20 MED ORDER — BISACODYL 10 MG RE SUPP: 10.0000 mg | Freq: Every day | RECTAL | 0 refills | Status: AC | PRN

## 2018-07-20 MED ORDER — INSULIN ASPART 100 UNIT/ML ~~LOC~~ SOLN: 0.0000 [IU] | SUBCUTANEOUS | 11 refills | Status: AC

## 2018-07-20 MED ORDER — SODIUM CHLORIDE 0.9% FLUSH: 10.0000 mL | INTRAVENOUS | Status: AC | PRN

## 2018-07-20 MED ORDER — VALPROATE SODIUM 500 MG/5ML IV SOLN: 500.0000 mg | Freq: Three times a day (TID) | INTRAVENOUS | Status: AC

## 2018-07-20 MED ORDER — SODIUM CHLORIDE 0.9 % IV SOLN: 250.0000 mL | INTRAVENOUS | 0 refills | Status: AC | PRN

## 2018-07-20 MED ORDER — SODIUM CHLORIDE 0.9% FLUSH: 3.0000 mL | Freq: Two times a day (BID) | INTRAVENOUS | Status: AC

## 2018-07-20 MED ORDER — MIDAZOLAM HCL 2 MG/2ML IJ SOLN
2.0000 mg | INTRAMUSCULAR | Status: DC | PRN
  Administered 2018-07-20 (×2): 2 mg via INTRAVENOUS
  Filled 2018-07-20 (×2): qty 2

## 2018-07-20 MED ORDER — FAMOTIDINE 20 MG PO TABS: 20.0000 mg | ORAL_TABLET | Freq: Every day | ORAL | Status: AC

## 2018-07-20 MED ORDER — FUROSEMIDE 10 MG/ML IJ SOLN: 40.0000 mg | Freq: Every day | INTRAMUSCULAR | 0 refills | Status: AC

## 2018-07-20 MED ORDER — ADULT MULTIVITAMIN LIQUID CH
15.0000 mL | Freq: Every day | ORAL | Status: DC
  Administered 2018-07-20: 15 mL
  Filled 2018-07-20: qty 15

## 2018-07-20 MED ORDER — PRO-STAT SUGAR FREE PO LIQD: 30.0000 mL | Freq: Two times a day (BID) | ORAL | Status: DC

## 2018-07-20 MED ORDER — FREE WATER: 100.0000 mL | Status: AC

## 2018-07-20 MED ORDER — ENOXAPARIN SODIUM 40 MG/0.4ML ~~LOC~~ SOLN: 40.0000 mg | SUBCUTANEOUS | Status: AC

## 2018-07-20 NOTE — Progress Notes (Signed)
CRITICAL CARE NOTE  CC  follow up respiratory failure  SUBJECTIVE Patient remains critically ill Prognosis is very poor Severe brian damage Son updated and notified Discussed trach and PEG tube +myoclonic jerks  BP (!) 105/54 (BP Location: Left Arm)   Pulse 71   Temp 98.1 F (36.7 C) (Rectal)   Resp 13   Ht 5' (1.524 m)   Wt 92.9 kg   SpO2 96%   BMI 40.00 kg/m    I/O last 3 completed shifts: In: 463.4 [I.V.:13.4; IV Piggyback:450] Out: 1422 [Urine:1347; Emesis/NG output:75] No intake/output data recorded.  SpO2: 96 % O2 Flow Rate (L/min): 2 L/min FiO2 (%): 24 %    REVIEW OF SYSTEMS  PATIENT IS UNABLE TO PROVIDE COMPLETE REVIEW OF SYSTEMS DUE TO SEVERE CRITICAL ILLNESS   PHYSICAL EXAMINATION:  GENERAL:critically ill appearing, +resp distress HEAD: Normocephalic, atraumatic.  EYES: Pupils equal, round, reactive to light.  No scleral icterus.  MOUTH: Moist mucosal membrane. NECK: Supple. No thyromegaly. No nodules. No JVD.  PULMONARY: +rhonchi,  CARDIOVASCULAR: S1 and S2. Regular rate and rhythm. No murmurs, rubs, or gallops.  GASTROINTESTINAL: Soft, nontender, -distended. No masses. Positive bowel sounds. No hepatosplenomegaly.  MUSCULOSKELETAL: No swelling, clubbing, or edema.  NEUROLOGIC: obtunded, GCS<3 SKIN:intact,warm,dry  MEDICATIONS: I have reviewed all medications and confirmed regimen as documented   CULTURE RESULTS   Recent Results (from the past 240 hour(s))  SARS Coronavirus 2 (CEPHEID- Performed in [facility] hospital lab), Hosp Order     Status: None   Collection Time: 07/11/18 10:19 AM  Result Value Ref Range Status   SARS Coronavirus 2 NEGATIVE NEGATIVE Final    Comment: (NOTE) If result is NEGATIVE SARS-CoV-2 target nucleic acids are NOT DETECTED. The SARS-CoV-2 RNA is generally detectable in upper and lower  respiratory specimens during the acute phase of infection. The lowest  concentration of SARS-CoV-2 viral copies this assay  can detect is 250  copies / mL. A negative result does not preclude SARS-CoV-2 infection  and should not be used as the sole basis for treatment or other  patient management decisions.  A negative result may occur with  improper specimen collection / handling, submission of specimen other  than nasopharyngeal swab, presence of viral mutation(s) within the  areas targeted by this assay, and inadequate number of viral copies  (<250 copies / mL). A negative result must be combined with clinical  observations, patient history, and epidemiological information. If result is POSITIVE SARS-CoV-2 target nucleic acids are DETECTED. The SARS-CoV-2 RNA is generally detectable in upper and lower  respiratory specimens dur ing the acute phase of infection.  Positive  results are indicative of active infection with SARS-CoV-2.  Clinical  correlation with patient history and other diagnostic information is  necessary to determine patient infection status.  Positive results do  not rule out bacterial infection or co-infection with other viruses. If result is PRESUMPTIVE POSTIVE SARS-CoV-2 nucleic acids MAY BE PRESENT.   A presumptive positive result was obtained on the submitted specimen  and confirmed on repeat testing.  While 2019 novel coronavirus  (SARS-CoV-2) nucleic acids may be present in the submitted sample  additional confirmatory testing may be necessary for epidemiological  and / or clinical management purposes  to differentiate between  SARS-CoV-2 and other Sarbecovirus currently known to infect humans.  If clinically indicated additional testing with an alternate test  methodology [phone]) is advised. The SARS-CoV-2 RNA is generally  detectable in upper and lower respiratory sp ecimens during the acute  phase of infection. The expected result is Negative. Fact Sheet for Patients:  BoilerBrush.com.cy Fact Sheet for Healthcare  Providers: https://pope.com/ This test is not yet approved or cleared by the [place] FDA and has been authorized for detection and/or diagnosis of SARS-CoV-2 by FDA under an Emergency Use Authorization (EUA).  This EUA will remain in effect (meaning this test can be used) for the duration of the COVID-19 declaration under Section 564(b)(1) of the Act, 21 U.S.C. section 360bbb-3(b)(1), unless the authorization is terminated or revoked sooner. Performed at [facility], [address] [place], [place] [address]   Urine Culture     Status: Abnormal   Collection Time: 07/13/18  2:32 PM  Result Value Ref Range Status   Specimen Description   Final    URINE, CLEAN CATCH Performed at [facility], [address] [place], [place] [address]    Special Requests   Final    NONE Performed at [facility], [address] [place], [place] [address]    Culture (A)  Final    >=100,000 COLONIES/mL ESCHERICHIA COLI 20,000 COLONIES/mL KLEBSIELLA OXYTOCA    Report Status 07/18/2018 FINAL  Final   Organism ID, Bacteria ESCHERICHIA COLI (A)  Final   Organism ID, Bacteria KLEBSIELLA OXYTOCA (A)  Final      Susceptibility   Escherichia coli - MIC*    AMPICILLIN >=32 RESISTANT Resistant     CEFAZOLIN <=4 SENSITIVE Sensitive     CEFTRIAXONE <=1 SENSITIVE Sensitive     CIPROFLOXACIN 1 SENSITIVE Sensitive     GENTAMICIN <=1 SENSITIVE Sensitive     IMIPENEM <=0.25 SENSITIVE Sensitive     NITROFURANTOIN <=16 SENSITIVE Sensitive     TRIMETH/SULFA >=320 RESISTANT Resistant     AMPICILLIN/SULBACTAM 16 INTERMEDIATE Intermediate     PIP/TAZO <=4 SENSITIVE Sensitive     Extended ESBL NEGATIVE Sensitive     * >=100,000 COLONIES/mL ESCHERICHIA COLI   Klebsiella oxytoca - MIC*    AMPICILLIN >=32 RESISTANT Resistant     CEFAZOLIN 16 SENSITIVE Sensitive     CEFTRIAXONE <=1 SENSITIVE Sensitive     CIPROFLOXACIN <=0.25 SENSITIVE Sensitive      GENTAMICIN <=1 SENSITIVE Sensitive     IMIPENEM <=0.25 SENSITIVE Sensitive     NITROFURANTOIN <=16 SENSITIVE Sensitive     TRIMETH/SULFA <=20 SENSITIVE Sensitive     AMPICILLIN/SULBACTAM >=32 RESISTANT Resistant     PIP/TAZO 16 SENSITIVE Sensitive     Extended ESBL NEGATIVE Sensitive     * 20,000 COLONIES/mL KLEBSIELLA OXYTOCA         CBC    Component Value Date/Time   WBC 10.1 07/20/2018 0444   RBC 2.95 (L) 07/20/2018 0444   HGB 8.9 (L) 07/20/2018 0444   HGB 13.4 07/06/2013 2113   HCT 29.8 (L) 07/20/2018 0444   HCT 40.9 07/06/2013 2113   PLT 376 07/20/2018 0444   PLT 347 07/06/2013 2113   MCV 101.0 (H) 07/20/2018 0444   MCV 94 07/06/2013 2113   MCH 30.2 07/20/2018 0444   MCHC 29.9 (L) 07/20/2018 0444   RDW 14.8 07/20/2018 0444   RDW 13.3 07/06/2013 2113   LYMPHSABS 0.8 07/16/2018 0455   MONOABS 1.4 (H) 07/16/2018 0455   EOSABS 0.0 07/16/2018 0455   BASOSABS 0.0 07/16/2018 0455   BMP Latest Ref Rng & Units 07/20/2018 07/19/2018 07/19/2018  Glucose 70 - 99 mg/dL 681(E) - 751(Z)  BUN 8 - 23 mg/dL 00(F) - 74(B)  Creatinine 0.44 - 1.00 mg/dL 4.49 - 6.75  Sodium 135 - 145 mmol/L 149(H) - 147(H)  Potassium 3.5 - 5.1 mmol/L 4.2 4.4 2.7(LL)  Chloride 98 - 111 mmol/L 106 - 99  CO2 22 - 32 mmol/L 34(H) - 35(H)  Calcium 8.9 - 10.3 mg/dL 4.0(J8.5(L) - 8.6(L)       Indwelling Urinary Catheter continued, requirement due to   Reason to continue Indwelling Urinary Catheter strict Intake/Output monitoring for hemodynamic instability   Central Line/ continued, requirement due to  Reason to continue [employer] Line Monitoring of central venous pressure or other hemodynamic parameters and poor IV access   Ventilator continued, requirement due to severe respiratory failure   Ventilator Sedation RASS 0 to -2      ASSESSMENT AND PLAN SYNOPSIS  78 year old African-American female with unknown downtime with severe cardiac arrest respiratory failure with signs symptoms of  anoxic brain  injury Status post hypothermia protocol  Patient has severe brain Damage  Severe ACUTE Hypoxic and Hypercapnic Respiratory Failure -continue Full MV support -continue Bronchodilator Therapy -Wean Fio2 and PEEP as tolerated Will need trach and PEG tube    NEUROLOGY Severe brain damage [facility]    CARDIAC ICU monitoring  ID -continue IV abx as prescibed -follow up cultures  GI GI PROPHYLAXIS as indicated  NUTRITIONAL STATUS DIET-->TF's as tolerated Constipation protocol as indicated  ENDO - will use ICU hypoglycemic\Hyperglycemia protocol if indicated   ELECTROLYTES -follow labs as needed -replace as needed -pharmacy consultation and following   DVT/GI PRX ordered TRANSFUSIONS AS NEEDED MONITOR FSBS ASSESS the need for LABS as needed   Critical Care Time devoted to patient care services described in this note is 34 minutes.   Overall, patient is critically ill, prognosis is guarded.  Patient with Multiorgan failure evidence of severe brain injury and damage  Nalleli Largent Santiago Gladavid Ronne Stefanski, M.D.  [facility]  Medical Director [facility] Medical Director [facility]

## 2018-07-20 NOTE — Consult Note (Addendum)
PHARMACY CONSULT NOTE - FOLLOW UP  Pharmacy Consult for Electrolyte Monitoring and Replacement   Recent Labs: Potassium (mmol/L)  Date Value  07/20/2018 4.2  07/07/2013 3.5   Magnesium (mg/dL)  Date Value  13/14/3888 2.5 (H)   Calcium (mg/dL)  Date Value  75/79/7282 8.5 (L)   Calcium, Total (mg/dL)  Date Value  07/17/5613 9.7   Albumin (g/dL)  Date Value  37/94/3276 3.0 (L)  07/06/2013 4.0   Phosphorus (mg/dL)  Date Value  14/70/9295 3.3   Sodium (mmol/L)  Date Value  07/20/2018 149 (H)  11/15/2014 139  07/06/2013 138   Assessment: Patient coded on 08/11/22, now code ice. Acute hypoxic respiratory failure due to acute on chronic diastolic heart failure status post mechanical intubation and status post PEA arrest. The plan is for her to transfer to [facility] for further care. She has been accepted for treatment. Lasix 40 mg IV daily started today  Goal of Therapy:  Magnesium~2, potassium~4  Plan:  --magnesium at high end of normal with concerns for active seizures  --Na trending upward: patient started tube feeds today: added free water flushes q4h --all other electrolytes WNL --recheck electrolytes with tomorrow am labs  Lowella Bandy, PharmD Clinical Pharmacist 07/20/2018 9:58 AM

## 2018-07-20 NOTE — Progress Notes (Signed)
[employer] -  at [facility]   PATIENT NAME: Tiffany Barber    MR#:  [id number]  DATE OF BIRTH:  08/21/1940  SUBJECTIVE:  Patient seen and evaluated today Currently on ventilator Ventilator settings Tidal volume 500 Rate 12 PEEP 5 FiO2 24 % Critically ill Has myoclonic jerks Severe brain damage Overall prognosis poor  REVIEW OF SYSTEMS:    Unable to obtain as patient is sedated on vent   Tolerating Diet:npo  DRUG ALLERGIES:   Allergies  Allergen Reactions  . Ace Inhibitors Other (See Comments)    Other reaction(s): angioedema resulting in intubation Other reaction(s): angioedema resulting in intubation   . Nifedipine Swelling and Shortness Of Breath  . Lisinopril     VITALS:  Blood pressure (!) 108/49, pulse 67, temperature (!) 97.3 F (36.3 C), resp. rate 12, height 5' (1.524 m), weight 92.9 kg, SpO2 97 %.  PHYSICAL EXAMINATION:  Constitutional: Intubated and sedated on ventilator  HENT: Normocephalic. . Intubated  eyes: Conjunctivae and EOM are normal. PERRLA, no scleral icterus.  Neck:  No JVD. No tracheal deviation. CVS: RRR, S1/S2 +, no murmurs, no gallops, no carotid bruit.  Pulmonary: Patient with bilateral crackles at bases Abdominal: Soft. BS +,  no distension, tenderness, rebound or guarding.  Musculoskeletal: 1+ lower extremity edema Neuro: on ventilator Skin: Skin is warm and dry. No rash noted. Psychiatric: Sedated  LABORATORY PANEL:   CBC Recent Labs  Lab 07/20/18 0444  WBC 10.1  HGB 8.9*  HCT 29.8*  PLT 376   ------------------------------------------------------------------------------------------------------------------  Chemistries  Recent Labs  Lab 07/15/18 0330  07/20/18 0444  NA  --    < > 149*  K  --    < > 4.2  CL  --    < > 106  CO2  --    < > 34*  GLUCOSE  --    < > 104*  BUN  --    < > 35*  CREATININE  --    < > 0.76  CALCIUM  --    < > 8.5*  MG 2.3   < > 2.5*  AST 141*  --   --   ALT  139*  --   --   ALKPHOS 135*  --   --   BILITOT 1.2  --   --    < > = values in this interval not displayed.   ------------------------------------------------------------------------------------------------------------------  Cardiac Enzymes Recent Labs  Lab 07/15/18 0142 07/15/18 0749 07/15/18 1314  TROPONINI 0.63* 0.54* 0.45*   ------------------------------------------------------------------------------------------------------------------  RADIOLOGY:  No results found.   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  1.  Acute hypoxic respiratory failure due to acute on chronic diastolic heart failure status post mechanical intubation and status post PEA arrest Continue full vent support Continue vent bundle Management as per intensivist Continue Lasix Levophed to keep map greater than 65 as needed  2.  PEA arrest of unclear etiology with early signs of anoxic brain injury Patient evaluated by cardiology.. 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  Hypokalemia: Replete PRN  4.  Hyponatremia  in setting of CHF: Continue to monitor  5.Transfer to [facility] once bed is available for second opinion  Management plans discussed with the patient's son yesterday  CODE STATUS: full  TOTAL TIME TAKING CARE OF THIS PATIENT: 34 minutes.   Consider PC consult if no improvement in next few days  POSSIBLE D/C ??, DEPENDING ON CLINICAL CONDITION.   Ihor Austin M.D on 07/20/2018 at 10:44 AM  Between 7am to 6pm - Pager - [phone] After 6pm go to [email or web address] -  password [email or web address]  [facility]  Office  [phone]  CC: Primary care physician; Emi Belfast, FNP  Note: This dictation was prepared with Dragon dictation along with smaller phrase technology. Any transcriptional errors that result from this process are unintentional.

## 2018-07-20 NOTE — Progress Notes (Signed)
eeg completed ° °

## 2018-07-20 NOTE — Procedures (Signed)
ELECTROENCEPHALOGRAM REPORT   Patient: Tiffany Barber       Room #: IC08C EEG No. ID: [id number] Age: 78 y.o.        Sex: female Referring Physician: Kasa Report Date:  07/20/2018        Interpreting Physician: Thana Farr  History: Tiffany Barber is an 78 y.o. female s/p arrest with altered mental status  Medications:  ASA, Ancef, Pepcid, Lasix, Insulin, Keppra, Pravachol, Depacon  Conditions of Recording:  This is a 21 channel routine scalp EEG performed with bipolar and monopolar montages arranged in accordance to the international 10/20 system of electrode placement. One channel was dedicated to EKG recording.  The patient is in the intubated and unresponsive state.  Description:  The background activity is initially dominated by movement artifact due to patient's twitching.  Patient was administered Vecuronium during the recording and soon after administration the background rhythm could be evaluated.  The background was slow and poorly organized  Consisting of low voltage polymorphic delta activity.  Intermittently there were noted periodic, high voltage, short bursts of generalized, polyspike, spike and slow wave activity.  These bursts occurred every 2 to 8 seconds and were persistent throughout the paralyzed recording.  Hyperventilation and intermittent photic stimulation were not performed.  IMPRESSION: This is an abnormal electroencephalogram secondary to generalized periodic epileptiform discharges superimposed on a slow background.  This can be seen in a variety of circumstances to include, but not limited to, metabolic abnormalities, diffuse cerebral injury or post-ictal after status epilepticus. Clinical/neurological, and radiographic correlation advised.    Thana Farr, MD Neurology [phone] 07/20/2018, 5:59 PM

## 2018-07-20 NOTE — Progress Notes (Signed)
Patient transferred to the CVICU at [facility] via [facility] transport at [phone].  Patient's cell phone, phone charger, clothing, and dentures sent with transport.  Patient's son Tiffany Barber) called and updated at this time.

## 2018-07-20 NOTE — Progress Notes (Signed)
Family in room-nurse asked me to wait to do EEG

## 2018-07-20 NOTE — Progress Notes (Signed)
Subjective: Patient with continued twitching noted  Objective: Current vital signs: BP (!) 108/49   Pulse 67   Temp (!) 97.3 F (36.3 C)   Resp 12   Ht 5' (1.524 m)   Wt 92.9 kg   SpO2 97%   BMI 40.00 kg/m  Vital signs in last 24 hours: Temp:  [94.6 F (34.8 C)-99 F (37.2 C)] 97.3 F (36.3 C) (06/03 1000) Pulse Rate:  [59-86] 67 (06/03 1000) Resp:  [12-27] 12 (06/03 1000) BP: (71-215)/(37-122) 108/49 (06/03 1000) SpO2:  [90 %-97 %] 97 % (06/03 1000) FiO2 (%):  [24 %] 24 % (06/03 0800) Weight:  [92.9 kg] 92.9 kg (06/03 0500)  Intake/Output from previous day: 06/02 0701 - 06/03 0700 In: 463.4 [I.V.:13.4; IV Piggyback:450] Out: 397 [Urine:322; Emesis/NG output:75] Intake/Output this shift: Total I/O In: 13 [I.V.:13] Out: 40 [Urine:40] Nutritional status:  Diet Order    None      Neurologic Exam: Mental Status: Patient does not respond to verbal stimuli.  Does not respond to deep sternal rub.  Does not follow commands.  No verbalizations noted.  Cranial Nerves: II: patient does not respond confrontation bilaterally, pupils right 4 mm, left 3 mm,and unreactive bilaterally III,IV,VI: doll's response absent bilaterally. Cold caloric responses absent bilaterally. V,VII: corneal reflex absent bilaterally  VIII: patient does not respond to verbal stimuli IX,X: gag reflex absent, XI: trapezius strength unable to test bilaterally XII: tongue strength unable to test Motor: Extremities flaccid throughout.  No purposeful movements noted.  Right facial twitching noted and tremor of the upper extremities Sensory: Does not respond to noxious stimuli in any extremity. Deep Tendon Reflexes:  Absent throughout. Plantars: absent bilaterally Cerebellar: Unable to perform    Lab Results: Basic Metabolic Panel: Recent Labs  Lab 07/14/18 1506  07/15/18 2310  07/16/18 0455  07/17/18 0432 07/17/18 1754 07/18/18 0331 07/19/18 0553 07/19/18 0821 07/19/18 1552  07/20/18 0444  NA 131*   < >  --    < > 140  --  145  --  145 147*  --   --  149*  K 3.9   < > 2.9*   < > 3.1*   < > 3.1* 2.9* 3.4* 2.7*  --  4.4 4.2  CL 88*   < >  --    < > 95*  --  98  --  99 99  --   --  106  CO2 25   < >  --    < > 32  --  33*  --  35* 35*  --   --  34*  GLUCOSE 165*   < >  --    < > 122*  --  108*  --  118* 118*  --   --  104*  BUN 58*   < >  --    < > 45*  --  44*  --  38* 33*  --   --  35*  CREATININE 1.21*   < >  --    < > 0.80  --  0.76  --  0.69 0.54  --   --  0.76  CALCIUM 9.1   < >  --    < > 8.4*  --  8.5*  --  8.6* 8.6*  --   --  8.5*  MG 2.8*   < > 2.8*  --   --   --  2.8*  --  2.6* 2.5*  --   --  2.5*  PHOS 6.9*  --   --   --   --   --  3.4  --  2.9  --  3.3  --   --    < > = values in this interval not displayed.    Liver Function Tests: Recent Labs  Lab 07/14/18 1506 07/15/18 0330  AST 199* 141*  ALT 156* 139*  ALKPHOS 151* 135*  BILITOT 1.0 1.2  PROT 6.3* 6.1*  ALBUMIN 3.1* 3.0*   No results for input(s): LIPASE, AMYLASE in the last 168 hours. No results for input(s): AMMONIA in the last 168 hours.  CBC: Recent Labs  Lab 07/14/18 1506 07/15/18 0330 07/16/18 0455 07/17/18 0547 07/18/18 0331 07/20/18 0444  WBC 19.3* 21.4* 21.8* 16.9* 14.5* 10.1  NEUTROABS 17.2*  --  19.3*  --   --   --   HGB 9.3* 8.7* 8.7* 8.6* 9.1* 8.9*  HCT 29.6* 26.4* 28.2* 27.6* 29.3* 29.8*  MCV 99.3 93.0 97.2 97.5 97.7 101.0*  PLT 481* 495* 535* 441* 461* 376    Cardiac Enzymes: Recent Labs  Lab 07/14/18 1506 07/14/18 2017 07/15/18 0142 07/15/18 0749 07/15/18 1314  TROPONINI 0.19* 0.42* 0.63* 0.54* 0.45*    Lipid Panel: Recent Labs  Lab 07/15/18 0330 07/16/18 0455  TRIG 111 160*    CBG: Recent Labs  Lab 07/19/18 1126 07/19/18 1936 07/19/18 2346 07/20/18 0400 07/20/18 0727  GLUCAP 123* 91 73 86 103*    Microbiology: Results for orders placed or performed during the hospital encounter of 07/11/18  SARS Coronavirus 2 (CEPHEID-  Performed in [facility] hospital lab), Hosp Order     Status: None   Collection Time: 07/11/18 10:19 AM  Result Value Ref Range Status   SARS Coronavirus 2 NEGATIVE NEGATIVE Final    Comment: (NOTE) If result is NEGATIVE SARS-CoV-2 target nucleic acids are NOT DETECTED. The SARS-CoV-2 RNA is generally detectable in upper and lower  respiratory specimens during the acute phase of infection. The lowest  concentration of SARS-CoV-2 viral copies this assay can detect is 250  copies / mL. A negative result does not preclude SARS-CoV-2 infection  and should not be used as the sole basis for treatment or other  patient management decisions.  A negative result may occur with  improper specimen collection / handling, submission of specimen other  than nasopharyngeal swab, presence of viral mutation(s) within the  areas targeted by this assay, and inadequate number of viral copies  (<250 copies / mL). A negative result must be combined with clinical  observations, patient history, and epidemiological information. If result is POSITIVE SARS-CoV-2 target nucleic acids are DETECTED. The SARS-CoV-2 RNA is generally detectable in upper and lower  respiratory specimens dur ing the acute phase of infection.  Positive  results are indicative of active infection with SARS-CoV-2.  Clinical  correlation with patient history and other diagnostic information is  necessary to determine patient infection status.  Positive results do  not rule out bacterial infection or co-infection with other viruses. If result is PRESUMPTIVE POSTIVE SARS-CoV-2 nucleic acids MAY BE PRESENT.   A presumptive positive result was obtained on the submitted specimen  and confirmed on repeat testing.  While 2019 novel coronavirus  (SARS-CoV-2) nucleic acids may be present in the submitted sample  additional confirmatory testing may be necessary for epidemiological  and / or clinical management purposes  to differentiate between   SARS-CoV-2 and other Sarbecovirus currently known to infect humans.  If clinically indicated additional testing  with an alternate test  methodology [phone]) is advised. The SARS-CoV-2 RNA is generally  detectable in upper and lower respiratory sp ecimens during the acute  phase of infection. The expected result is Negative. Fact Sheet for Patients:  StrictlyIdeas.no Fact Sheet for Healthcare Providers: BankingDealers.co.za This test is not yet approved or cleared by the [place] FDA and has been authorized for detection and/or diagnosis of SARS-CoV-2 by FDA under an Emergency Use Authorization (EUA).  This EUA will remain in effect (meaning this test can be used) for the duration of the COVID-19 declaration under Section 564(b)(1) of the Act, 21 U.S.C. section 360bbb-3(b)(1), unless the authorization is terminated or revoked sooner. Performed at [facility], [address] [place], [place] [address]   Urine Culture     Status: Abnormal   Collection Time: 07/13/18  2:32 PM  Result Value Ref Range Status   Specimen Description   Final    URINE, CLEAN CATCH Performed at [facility], [address] [place], [place] [address]    Special Requests   Final    NONE Performed at [facility], [place], [place] [address]    Culture (A)  Final    >=100,000 COLONIES/mL ESCHERICHIA COLI 20,000 COLONIES/mL KLEBSIELLA OXYTOCA    Report Status 07/18/2018 FINAL  Final   Organism ID, Bacteria ESCHERICHIA COLI (A)  Final   Organism ID, Bacteria KLEBSIELLA OXYTOCA (A)  Final      Susceptibility   Escherichia coli - MIC*    AMPICILLIN >=32 RESISTANT Resistant     CEFAZOLIN <=4 SENSITIVE Sensitive     CEFTRIAXONE <=1 SENSITIVE Sensitive     CIPROFLOXACIN 1 SENSITIVE Sensitive     GENTAMICIN <=1 SENSITIVE Sensitive     IMIPENEM <=0.25 SENSITIVE Sensitive     NITROFURANTOIN <=16 SENSITIVE  Sensitive     TRIMETH/SULFA >=320 RESISTANT Resistant     AMPICILLIN/SULBACTAM 16 INTERMEDIATE Intermediate     PIP/TAZO <=4 SENSITIVE Sensitive     Extended ESBL NEGATIVE Sensitive     * >=100,000 COLONIES/mL ESCHERICHIA COLI   Klebsiella oxytoca - MIC*    AMPICILLIN >=32 RESISTANT Resistant     CEFAZOLIN 16 SENSITIVE Sensitive     CEFTRIAXONE <=1 SENSITIVE Sensitive     CIPROFLOXACIN <=0.25 SENSITIVE Sensitive     GENTAMICIN <=1 SENSITIVE Sensitive     IMIPENEM <=0.25 SENSITIVE Sensitive     NITROFURANTOIN <=16 SENSITIVE Sensitive     TRIMETH/SULFA <=20 SENSITIVE Sensitive     AMPICILLIN/SULBACTAM >=32 RESISTANT Resistant     PIP/TAZO 16 SENSITIVE Sensitive     Extended ESBL NEGATIVE Sensitive     * 20,000 COLONIES/mL KLEBSIELLA OXYTOCA    Coagulation Studies: No results for input(s): LABPROT, INR in the last 72 hours.  Imaging: No results found.  Medications:  I have reviewed the patient's current medications. Scheduled: . aspirin  81 mg Per Tube Daily  . chlorhexidine gluconate (MEDLINE KIT)  15 mL Mouth Rinse BID  . Chlorhexidine Gluconate Cloth  6 each Topical Daily  . docusate  100 mg Per Tube BID  . enoxaparin (LOVENOX) injection  40 mg Subcutaneous Q24H  . famotidine  20 mg Per Tube Daily  . furosemide  40 mg Intravenous Daily  . insulin aspart  0-15 Units Subcutaneous Q4H  . mouth rinse  15 mL Mouth Rinse 10 times per day  . pravastatin  20 mg Per Tube q1800  . sodium chloride flush  10-40 mL Intracatheter Q12H  .  sodium chloride flush  3 mL Intravenous Q12H    Assessment/Plan: Patient remains unresponsive and with no evidence of brain stem reflexes.  Has developed facial twitching and bilateral tremors.  Concern is for seizure.  This again portends poor prognosis.  Patient on Depakote and Keppra.  Keppra dose increased yesterday to 1032m BID.  Depakote level of 91.    Recommendations: 1. EEG pending 2. Increase in Keppra to 15092mBID 3. Will repeat head  CT once patient more stable.     LOS: 9 days   LeAlexis GoodellMD Neurology [phone]/04/2018  10:35 AM

## 2018-07-20 NOTE — Consult Note (Signed)
Consultation Note Date: 07/20/2018   Patient Name: Tiffany Barber  DOB: 05/27/1940  MRN: [id number]  Age / Sex: 78 y.o., female   PCP: Elby Beck, FNP Referring Physician: Saundra Shelling, MD   REASON FOR CONSULTATION:Establishing goals of care  Palliative Care consult requested for this 78 y.o. female with multiple medical problems including hypertension, hyperlipidemia, left ventricular hypertrophy, stroke and recent heart block with pacemaker placement. She presented to ED with complaints of worsening shortness of breath and swelling x 5 days. COVID-19 negative. She was initiated on 3L/[place] due to hypoxia and chest x-ray showed pulmonary edema. Patient suffered a severe cardiac arrest with hypercapnic respiratory failure on 5/28) requiring intubation. She continues on full MV support with signs of anoxic brain injury.   Clinical Assessment and Goals of Care: I have reviewed medical records including lab results, imaging, Epic notes, and MAR, received report from the bedside RN, and assessed the patient. I met at the bedside with patient's son, Zaharah Amir [facility]) and his wife Freda Munro to discuss diagnosis prognosis, Jardine, EOL wishes, disposition and options. Patient remains intubated, unresponsive, with intermittent facial and leg twitching.   I introduced Palliative Medicine as specialized medical care for people living with serious illness. It focuses on providing relief from the symptoms and stress of a serious illness. The goal is to improve quality of life for both the patient and the family. Family verbalized understanding and appreciation.   We discussed a brief life review of the patient, along with her functional and nutritional status. Son, Lanny Hurst shares patient is originally from [place], [place]. She moved to [facility] in 2006 after her husband passed away to be closer to her 4 children. She has lived with her son Lanny Hurst for almost 2 years. Lanny Hurst spends time sharing their [place] and the importance it has on his mom and family life. He shares she read the bible for at least 2 hrs daily, was very active in the church, and would pray often.   Family reports prior to admission patient was ambulatory with walker on occassions, but otherwise independently. He reports family prepared most of the meals but she would cook some of her original [place] style foods at times. She had a great appetite and could perform ADLs independently. He shares that she was in overall good health prior to admission. 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 He shares that although she had swelling and concerns with her heart that seemed to be well controlled.   We discussed Her current illness and what it means in the larger context of Her on-going co-morbidities. With specific discussions regarding cardiac arrest, anoxic brain injury, seizure like activity, and continuous requirement for full mechanical ventilation support. Natural disease trajectory and expectations at EOL were discussed. Family verbalized their understanding of discussed illness and prognosis, however openly shared that they did not receive the information.    Continues to speak of Darrick Meigs faith and how they are expecting a miracle.  Continue to state "God is a healer and a Librarian, academic.  Physicians and medical personnel are here to practice and hopefully he will but ultimately do not have the final say so and you can only make a medical guesstimate."  Son continues to share how they have multiple family members that have been on life and their situations in [place] as well as other areas of the [place] and have made a miraculous recovery is even when medical professionals said there was  not a chance.  He shares they recently have a cousin who was in a coma in [place] and all family was called in for end-of-life.  He shares family did not give up continue to praying and their cousin walked out of the hospital in February and  even the doctors said that it was a miracle.  I attempted to explain to son that although we do not have a divine power the medical team has extensive training and experience and can determine prognosis and outcomes with confidence and assistance of test results. I explained each medical situation is different and I cannot speak to what happened with his family member in [place].  Son and wife prayed at the bedside. After prayer he expressed that he felt his mother moving her leg in response and that her facial twitching ceased during prayer. He is tearful and continues to state "she knows and prayer works, we will not give up!"   Family continues to express their high regards to their faith. Son is requesting patient's phone be left next to her ear with bible app open and audio on. He shares he understands it may need to be moved or paused during care but would like it to be placed back and turned on once care is provided. He shares that he wishes for the medical team to continue to move forward in her transfer once a bed is available at [facility] or [facility]. He shares he feels that they would not have agreed to allow a transfer if they felt there was nothing left to be done.   I allowed son and his wife to continue sharing memories and their hopes for patient. He shares he does not want her to suffer, is hopeful she will live, that God's will be done, as he can not live without her. Emotional support provided. I re-shared his statements in regards to him not wanting her to suffer and God's will. I inquired what his limitations would be in regards to her care, what signs he would see in regards to God's will, and how we as a medical team could support.   Son and wife both stated "if she is going to die she will die on or off the machine. God will give [place] that sign or show [place] that she is not going to make it. " I further attempted to facilitate discussion on what his definition and the signs of suffering. He explained  that she would not be able to live on her own, in pain, or seemed uncomfortable. I attempted to explain and point out signs of how patient is looking, the use of machines which was providing her source of life artificially, and different signs of little to no life quality. He and his wife were tearful and explained they understood what I was discussing but again deferred to signs from God.   I attempted to elicit values and goals of care important to the patient.    The difference between aggressive medical intervention and comfort care was considered in light of the patient's goals of care. Family is requesting full aggressive medical intervention.   I discussed in detail her current full code status with consideration to her poor prognosis, current illness, and statements made by family. Son again expressed wishes for her to remain a full code and statement of "if she is going to die she will die regardless of what you all do if it is God's will".   I discussed in detail  the need for tracheostomy and PEG placement in the future as well as the high likelihood of long term care needs. He verbalized understanding and expressed again, "if it gives her a chance I want it done". He shares his family recently loss his sister in April after years of illness, how his mother is healthier than he is, and how he is seeing a psychiatrist due to coping with his sister's (his best friend in life) death. His 2 siblings remain in [place] and are unable to visit however, his other sister is on the way from out of town to be more available. He states they have discussed their mom's condition over and over and at this time they can not imagine losing her at this time and so close to his sister's death.   Questions and concerns were addressed.  Hard Choices booklet left for review. The family was encouraged to call with questions or concerns.  PMT will continue to support holistically.   SOCIAL HISTORY:     reports that  she has never smoked. She has never used smokeless tobacco. She reports that she does not drink alcohol or use drugs.  CODE STATUS: Full code  ADVANCE DIRECTIVES: Son:  Geneviene Tesch [facility])  SYMPTOM MANAGEMENT: per attending   Palliative Prophylaxis:   Aspiration, Oral Care and Turn Reposition  PSYCHO-SOCIAL/SPIRITUAL:  Support System: Family   Desire for further Chaplaincy support:YES   Additional Recommendations (Limitations, Scope, Preferences):  Full Scope Treatment   PAST MEDICAL HISTORY: Past Medical History:  Diagnosis Date  . 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 Hyperlipidemia   . Hypertension   . LVH (left ventricular hypertrophy)   . Stroke [facility])     PAST SURGICAL HISTORY:  Past Surgical History:  Procedure Laterality Date  . JOINT REPLACEMENT Left 2019   left knee  . PACEMAKER INSERTION N/A 06/30/2018   Procedure: INSERTION PACEMAKER;  Surgeon: Isaias Cowman, MD;  Location: [facility] ORS;  Service: Cardiovascular;  Laterality: N/A;  . REPLACEMENT TOTAL KNEE BILATERAL      ALLERGIES:  is allergic to ace inhibitors; nifedipine; and lisinopril.   MEDICATIONS:  Current Facility-Administered Medications  Medication Dose Route Frequency Provider Last Rate Last Dose  . 0.9 %  sodium chloride infusion  250 mL Intravenous PRN Demetrios Loll, MD 10 mL/hr at 07/20/18 0600    . acetaminophen (TYLENOL) tablet 650 mg  650 mg Oral Q6H PRN Demetrios Loll, MD       Or  . acetaminophen (TYLENOL) suppository 650 mg  650 mg Rectal Q6H PRN Demetrios Loll, MD      . albuterol (PROVENTIL) (2.5 MG/3ML) 0.083% nebulizer solution 2.5 mg  2.5 mg Nebulization Q2H PRN Demetrios Loll, MD   2.5 mg at 07/14/18 0444  . aspirin chewable tablet 81 mg  81 mg Per Tube Daily Awilda Bill, NP   81 mg at 07/20/18 1020  . bisacodyl (DULCOLAX) suppository 10 mg  10 mg Rectal Daily PRN Awilda Bill, NP      . ceFAZolin (ANCEF) IVPB 1 g/50 mL premix  1 g Intravenous Q8H Dallie Piles, RPH 100 mL/hr at 07/20/18 1359 1 g at 07/20/18 1359  . chlorhexidine gluconate (MEDLINE KIT) (PERIDEX) 0.12 % solution 15 mL  15 mL Mouth Rinse BID Ottie Glazier, MD   15 mL at 07/20/18 0800  . Chlorhexidine Gluconate Cloth 2 % PADS 6 each  6 each Topical Daily Ottie Glazier, MD  6 each at 07/19/18 2300  . docusate (COLACE) 50 MG/5ML liquid 100 mg  100 mg Per Tube BID Awilda Bill, NP   Stopped at 07/20/18 1029  . enoxaparin (LOVENOX) injection 40 mg  40 mg Subcutaneous Q24H Dallie Piles, RPH   40 mg at 07/20/18 1357  . famotidine (PEPCID) tablet 20 mg  20 mg Per Tube Daily Flora Lipps, MD   20 mg at 07/20/18 1020  . feeding supplement (PRO-STAT SUGAR FREE 64) liquid 30 mL  30 mL Per Tube BID Flora Lipps, MD      . feeding supplement (VITAL AF 1.2 CAL) liquid 1,000 mL  1,000 mL Per Tube Continuous Kasa, Kurian, MD      . fentaNYL 2549mg in NS 2580m(1030mml) infusion-PREMIX  0-400 mcg/hr Intravenous Continuous BlaAwilda BillP      . furosemide (LASIX) injection 40 mg  40 mg Intravenous Daily BlaAwilda BillP   40 mg at 07/20/18 1020  . insulin aspart (novoLOG) injection 0-15 Units  0-15 Units Subcutaneous Q4H AleOttie GlazierD   2 Units at 07/19/18 0037  . levETIRAcetam (KEPPRA) IVPB 1500 mg/ 100 mL premix  1,500 mg Intravenous BID KeeDarel Hong NP 400 mL/hr at 07/20/18 1022 1,500 mg at 07/20/18 1022  . MEDLINE mouth rinse  15 mL Mouth Rinse 10 times per day AleOttie GlazierD   15 mL at 07/20/18 1347  . midazolam (VERSED) injection 2 mg  2 mg Intravenous Q1H PRN KeeDarel Hong NP   2 mg at 07/20/18 1020  . multivitamin liquid 15 mL  15 mL Per Tube Daily KasFlora LippsD      . ondansetron (ZOVidante Edgecombe Hospitalablet 4 mg  4 mg Oral Q6H PRN CheDemetrios LollD       Or  . ondansetron (ZOJohnson Memorial Hospitalnjection 4 mg  4 mg Intravenous Q6H PRN CheDemetrios LollD      . pravastatin (PRAVACHOL) tablet 20 mg  20 mg Per Tube q1800 BlaAwilda BillP   20 mg at 07/19/18 1824  . sodium  chloride flush (NS) 0.9 % injection 10-40 mL  10-40 mL Intracatheter Q12H KeeDarel Hong NP   10 mL at 07/20/18 1028  . sodium chloride flush (NS) 0.9 % injection 10-40 mL  10-40 mL Intracatheter PRN KeeDarel Hong NP      . sodium chloride flush (NS) 0.9 % injection 3 mL  3 mL Intravenous Q12H CheDemetrios LollD   3 mL at 07/20/18 1029  . sodium chloride flush (NS) 0.9 % injection 3 mL  3 mL Intravenous PRN CheDemetrios LollD      . valproate (DEPACON) 500 mg in dextrose 5 % 50 mL IVPB  500 mg Intravenous Q8H ZeyLeotis PainD 55 mL/hr at 07/20/18 0608 500 mg at 07/20/18 0608  . vecuronium (NORCURON) injection 10 mg  10 mg Intravenous Q2H PRN KeeDarel Hong NP   10 mg at 07/19/18 0109    VITAL SIGNS: BP 109/60   Pulse (!) 59   Temp (!) 97 F (36.1 C)   Resp 14   Ht 5' (1.524 m)   Wt 92.9 kg   SpO2 94%   BMI 40.00 kg/m  Filed Weights   07/18/18 0328 07/19/18 0500 07/20/18 0500  Weight: 91.9 kg 92.9 kg 92.9 kg    Estimated body mass index is 40 kg/m as calculated from the following:   Height as of this encounter: 5' (1.524 m).  Weight as of this encounter: 92.9 kg.  LABS: CBC:    Component Value Date/Time   WBC 10.1 07/20/2018 0444   HGB 8.9 (L) 07/20/2018 0444   HGB 13.4 07/06/2013 2113   HCT 29.8 (L) 07/20/2018 0444   HCT 40.9 07/06/2013 2113   PLT 376 07/20/2018 0444   PLT 347 07/06/2013 2113   Comprehensive Metabolic Panel:    Component Value Date/Time   NA 149 (H) 07/20/2018 0444   NA 139 11/15/2014   NA 138 07/06/2013 2113   K 4.2 07/20/2018 0444   K 3.5 07/07/2013 0957   CO2 34 (H) 07/20/2018 0444   CO2 26 07/06/2013 2113   BUN 35 (H) 07/20/2018 0444   BUN 17 11/15/2014   BUN 21 (H) 07/06/2013 2113   CREATININE 0.76 07/20/2018 0444   CREATININE 0.70 07/06/2013 2113   ALBUMIN 3.0 (L) 07/15/2018 0330   ALBUMIN 4.0 07/06/2013 2113     Review of Systems  Unable to perform ROS: Acuity of condition   Unless otherwise noted, a complete review of  systems is negative.  Physical Exam General: critically-ill appearing HENT: pupils unequal, unreactive, right 56m, left 290mCardiovascular: regular rate and rhythm Pulmonary: +rhonci, intubated Abdomen: soft, nontender, + bowel sounds, no distention Extremities: no edema, no joint deformities Skin: no rashes, warm, dry, intact Neurological: obtunded, noticeable facial seizure like movement (twitching)   Prognosis: Poor in the setting of severe cardiac arrest (5/28) with respiratory failure, remains intubated (full support), signs of seizure like activity and anoxic brain injury, s/p hypothermic protocol.   Discharge Planning:  [facility]  Recommendations:  Full Code-at family's request  Continue full aggressive medical interventions  Family remains hopeful despite multiple updates from providers and detailed discussions on patient's poor prognosis. Family is relying on spiritual and divine interventions for "a walking miracle"  Family continues to request medical team remain in contact [facility]   Son wishes to proceed with trach/peg if required  PMT will continue to support and follow. Family will require ongoing discussions regarding [place] poor prognosis.    Palliative Performance Scale: INTUBATED/ANOXIC BRAIN INJURY              Family expressed understanding and was in agreement with this plan.   Thank you for allowing the Palliative Medicine Team to assist in the care of this patient.  Time In:1200 Time Out: 1350 Time Total: 110 min.   Visit consisted of counseling and education dealing with the complex and emotionally intense issues of symptom management and palliative care in the setting of serious and potentially life-threatening illness.Greater than 50%  of this time was spent counseling and coordinating care related to the above assessment and plan.   Signed by:  NiAlda LeaAGPCNP-BC Palliative Medicine Team  Phone: [phone]: [phone]: [phone]: N.Bjorn Pippin

## 2018-07-20 NOTE — Progress Notes (Signed)
Nutrition Follow-up  RD working remotely.  DOCUMENTATION CODES:   Obesity unspecified  INTERVENTION:  Initiate Vital AF 1.2 Cal at 20 mL/hr and after 8 hours increase to 40 mL/hr. Also provide Pro-Stat 30 mL BID. Provides 1352 kcal, 102 grams of protein, 778 mL H2O daily.  Provide liquid MVI daily per tube.  Provide minimum free water flush of 30 mL Q4hrs to maintain tube patency.  Monitor magnesium, potassium, and phosphorus daily for at least 3 days, MD to replete as needed, as pt is at risk for refeeding syndrome given >7 days without nutrition.  NUTRITION DIAGNOSIS:   Inadequate oral intake related to inability to eat(pt sedated and ventilated ) as evidenced by NPO status.  Ongoing - addressing with TF regimen.  GOAL:   Provide needs based on ASPEN/SCCM guidelines  Met with TF regimen.  MONITOR:   Labs, Weight trends, I & O's, Skin, Vent status  REASON FOR ASSESSMENT:   Ventilator    ASSESSMENT:   78 y.o. female hypertension, hyperlipidemia, left ventricular hypertrophy, stroke and recent heart block with pacemaker placement admitted with SOB and now s/p PEA cardiac arrest   Patient intubated. Not requiring any continuous sedation. On PRVC mode with FiO2 24% and PEEP 5 cmH2O. Abdomen soft per RN assessment. Patient had type 7 BM yesterday. Patient remains unresponsive with no evidence of brain stem reflexes. Also now concern for seizures. Remains full code/full scope of care. MD discussed trach/PEG with family today. Plan is to initiate enteral nutrition today.  Enteral Access: OGT placed 5/28; terminates in gastric fundus per abdominal x-ray 5/28; 65 cm at corner of mouth  MAP: 65-114 mmHg  Patient is currently intubated on ventilator support Ve: 5.9 L/min Temp (24hrs), Avg:97.7 F (36.5 C), Min:95.5 F (35.3 C), Max:99 F (37.2 C)  Propofol: N/A  Medications reviewed and include: Colace 100 mg BID, famotidine, Novolog 0-15 units Q4hrs, cefazolin, Keppra,  valproate.  Labs reviewed: CBG 86-103, CO2 34, BUN 35, Magnesium 2.5.  I/O: 322 mL UOP yesterday (0.1 mL/kg/hr)  Weight trend: 92.9 kg on 6/3; -3.4 kg from 5/26  Diet Order:   Diet Order    None     EDUCATION NEEDS:   No education needs have been identified at this time  Skin:  Skin Assessment: Skin Integrity Issues:(closed incision to chest; ecchymosis)  Last BM:  07/19/2018 - medium type 7  Height:   Ht Readings from Last 1 Encounters:  07/17/18 5' (1.524 m)   Weight:   Wt Readings from Last 1 Encounters:  07/20/18 92.9 kg   Ideal Body Weight:  45.5 kg  BMI:  Body mass index is 40 kg/m.  Estimated Nutritional Needs:   Kcal:  1030-1310kcal/day   Protein:  >91g/day   Fluid:  >1.1L/day   Willey Blade, MS, RD, LDN Office: [phone] Pager: [phone] After Hours/Weekend Pager: [phone]

## 2018-07-20 NOTE — Discharge Summary (Signed)
Physician Discharge Summary  Patient ID: Tiffany Barber MRN: [id number] DOB/AGE: 03-09-1940 78 y.o.  Admit date: 07/11/2018 Discharge date: 07/20/2018                                                                      DISCHARGE SUMMARY   Tiffany Barber is a 78 y.o. y/o female with a PMH of Stroke, Left Ventricular Hypertrophy, HTN, Hyperlipidemia, and Arthritis.  Pt presented to [facility] ER on 07/11/2018 from home via EMS with worsening shortness of breath, shoulder pain, and weakness onset of symptoms 07/09/2018.  She underwent dual chamber pacemaker placement on 06/30/2018 after failing conservative treatment of second degree heart block.  Per ER notes pts family reported the pts lasix was stopped while she was hospitalized for pacemaker placement, and they restarted the lasix due to pt developing peripheral edema.  She does not wear home O2, however she started using her son's oxygen a few days prior to presentation.  Upon EMS arrival at pts home her O2 sats were 96% on 3L via nasal canula.  Initial EKG revealed atrial sensing with ventricular pacing.  CXR concerning for cardiomegaly and mild vascular congestion.  Lab results revealed NA+ 134, BUN 41, AST 50, ALT 63, BNP 145, troponin <0.03, lactic acid 1.6, and COVID-19 negative.  Pt received iv lasix and nebulized albuterol in the ER.  She was subsequently admitted to the telemetry unit for additional workup and treatment. On 07/14/2018 pt found unresponsive and pulseless cardiac rhythm PEA, Code Blue initiated at 1243 pm with ROSC at 1256 pm (she received 3 amps of epinephrine) she required mechanical intubation. Pt subsequently transferred to ICU for additional workup and treatment.  Post cardiac arrest pt did not follow commands CT Head on 07/14/2018 negative for acute intracranial process or skull fracture.  Hypothermic protocol initiated at 36 degrees C on 07/14/2018.  Pt developed severe myoclonic jerking on 07/14/2018, therefore pt started on  valproate acid and keppra Neurology consulted.  EEG on 07/14/2018 revealed background activity is mostly normal but frequently interrupted with generalized delta slowing especially in the occipital areas.  No epileptiform discharges are observed. Following completion of hypothermic protocol pt remained unresponsive with non reactive pupils.  Repeat CT Head on 07/16/2018 revealed indistinct appearance of the deep brain structures including the basal ganglia and thalami raising the possibility deep brain hypoxic ischemic injury, this is not absolutely definite, no evidence of hemorrhage, and no evidence of diffuse cortical swelling or injury.  Unable to obtain MR Brain due to pacemaker.  EEG revealed multiple episodes of generalized spike slow wave epileptiform discharges with maximum activity in the occipital parietal regions bilaterally, moderate global slowing indicating a moderate global encephalopathy, frequent burst suppression pattern typically seen in medication effect from sedation.  Following further discussion with pts family they requested transfer for additional workup and treatment.  SIGNIFICANT DIAGNOSTIC STUDIES/SIGNIFICANT EVENTS 05/25-Pt admitted to the telemetry unit with acute respiratory failure due to acute diastolic CHF  58/83-GPQD Blue initiated pt PEA arrested requiring mechanical intubation and transfer to ICU  07/14/2018-CT Head revealed no definite CT findings for acute intracranial process or skull fracture. 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  05/28-Hypothermic protocol initiated at 36 degree C  05/28-EEG revealed background activity is mostly normal but frequently interrupted with generalized delta slowing especially in the occipital areas.  No epileptiform discharges are observed. 05/29-EEG revealed Multiple episodes of generalized spike slow wave epileptiform discharges with maximum activity in the occipital parietal  regions bilaterally. Moderate global slowing indicating a moderate global encephalopathy. Frequent burst suppression pattern typically seen in medication effect from sedation. 05/30-BLE venous ultrasound revealed no evidence of lower extremity DVT  MICRO DATA  Urine culture 05/27>>ecoli and klebsiella oxytoca  COVID-19 05/25>>negative   ANTIBIOTICS Cefazolin   CONSULTS [place]  Cardiology  Neurology   TUBES / LINES Left femoral central line   Discharge Exam: General: well developed, well nourished female, mechanically intubated  Neuro: not following commands, pupils unequal right pupil 2 mm sluggish left pupil 3 mm nonreactive HEENT: supple, no JVD  Cardiovascular: A-V paced, no R/G  Lungs: faint rhonchi and diminished throughout, even, non labored  Abdomen: +BS x4, soft, obese, non distended  Musculoskeletal: 2+ generalized edema  Skin: intact no rashes or lesions present   Vitals:   07/20/18 1300 07/20/18 1400 07/20/18 1500 07/20/18 1600  BP: 109/60 (!) 111/54 110/60 127/76  Pulse: (!) 59 61 83 88  Resp: '14 15 13 15  ' Temp: (!) 97 F (36.1 C) (!) 97.2 F (36.2 C) (!) 97.2 F (36.2 C) (!) 97.3 F (36.3 C)  TempSrc:      SpO2: 94% 96% 94% 97%  Weight:      Height:         Discharge Labs  BMET Recent Labs  Lab 07/14/18 1506  07/15/18 2310  07/16/18 0455  07/17/18 0432  07/18/18 0331 07/19/18 0553 07/19/18 0821 07/19/18 1552 07/20/18 0444  NA 131*   < >  --    < > 140  --  145  --  145 147*  --   --  149*  K 3.9   < > 2.9*   < > 3.1*   < > 3.1*   < > 3.4* 2.7*  --  4.4 4.2  CL 88*   < >  --    < > 95*  --  98  --  99 99  --   --  106  CO2 25   < >  --    < > 32  --  33*  --  35* 35*  --   --  34*  GLUCOSE 165*   < >  --    < > 122*  --  108*  --  118* 118*  --   --  104*  BUN 58*   < >  --    < > 45*  --  44*  --  38* 33*  --   --  35*  CREATININE 1.21*   < >  --    < > 0.80  --  0.76  --  0.69 0.54  --   --  0.76  CALCIUM 9.1   < >  --    < > 8.4*   --  8.5*  --  8.6* 8.6*  --   --  8.5*  MG 2.8*   < > 2.8*  --   --   --  2.8*  --  2.6* 2.5*  --   --  2.5*  PHOS 6.9*  --   --   --   --   --  3.4  --  2.9  --  3.3  --   --    < > =  values in this interval not displayed.    CBC Recent Labs  Lab 07/17/18 0547 07/18/18 0331 07/20/18 0444  HGB 8.6* 9.1* 8.9*  HCT 27.6* 29.3* 29.8*  WBC 16.9* 14.5* 10.1  PLT 441* 461* 376    Anti-Coagulation Recent Labs  Lab 07/14/18 1505  INR 1.4*          Allergies as of 07/20/2018      Reactions   Ace Inhibitors Other (See Comments)   Other reaction(s): angioedema resulting in intubation Other reaction(s): angioedema resulting in intubation   Nifedipine Swelling, Shortness Of Breath   Lisinopril       Medication List    STOP taking these medications   albuterol 108 (90 Base) MCG/ACT inhaler Commonly known as:  VENTOLIN HFA Replaced by:  albuterol (2.5 MG/3ML) 0.083% nebulizer solution   amLODipine 10 MG tablet Commonly known as:  NORVASC   aspirin 81 MG EC tablet Replaced by:  aspirin 81 MG chewable tablet   hydrochlorothiazide 25 MG tablet Commonly known as:  HYDRODIURIL   lovastatin 20 MG tablet Commonly known as:  MEVACOR Replaced by:  pravastatin 20 MG tablet   meloxicam 15 MG tablet Commonly known as:  MOBIC   Spacer/Aero-Holding Chambers Devi   traMADol 50 MG tablet Commonly known as:  ULTRAM     TAKE these medications   acetaminophen 325 MG tablet Commonly known as:  TYLENOL Take 2 tablets (650 mg total) by mouth every 6 (six) hours as needed for mild pain (or Fever >/= 101).   acetaminophen 650 MG suppository Commonly known as:  TYLENOL Place 1 suppository (650 mg total) rectally every 6 (six) hours as needed for mild pain (or Fever >/= 101).   albuterol (2.5 MG/3ML) 0.083% nebulizer solution Commonly known as:  PROVENTIL Take 3 mLs (2.5 mg total) by nebulization every 2 (two) hours as needed for wheezing. Replaces:  albuterol 108 (90 Base)  MCG/ACT inhaler   aspirin 81 MG chewable tablet Place 1 tablet (81 mg total) into feeding tube daily. Start taking on:  July 21, 2018 Replaces:  aspirin 81 MG EC tablet   bisacodyl 10 MG suppository Commonly known as:  DULCOLAX Place 1 suppository (10 mg total) rectally daily as needed for moderate constipation.   ceFAZolin 1-4 GM/50ML-% Soln Commonly known as:  ANCEF Inject 50 mLs (1 g total) into the vein every 8 (eight) hours.   chlorhexidine gluconate (MEDLINE KIT) 0.12 % solution Commonly known as:  PERIDEX 15 mLs by Mouth Rinse route 2 (two) times daily.   Chlorhexidine Gluconate Cloth 2 % Pads Apply 6 each topically daily.   docusate 50 MG/5ML liquid Commonly known as:  COLACE Place 10 mLs (100 mg total) into feeding tube 2 (two) times daily.   enoxaparin 40 MG/0.4ML injection Commonly known as:  LOVENOX Inject 0.4 mLs (40 mg total) into the skin daily. Start taking on:  July 21, 2018   famotidine 20 MG tablet Commonly known as:  PEPCID Place 1 tablet (20 mg total) into feeding tube daily. Start taking on:  July 21, 2018   feeding supplement (PRO-STAT SUGAR FREE 64) Liqd Place 30 mLs into feeding tube 2 (two) times daily.   feeding supplement (VITAL AF 1.2 CAL) Liqd Place 1,000 mLs into feeding tube continuous.   free water Soln Place 100 mLs into feeding tube every 4 (four) hours.   furosemide 10 MG/ML injection Commonly known as:  LASIX Inject 4 mLs (40 mg total) into the vein daily. Start taking  on:  July 21, 2018   insulin aspart 100 UNIT/ML injection Commonly known as:  novoLOG Inject 0-15 Units into the skin every 4 (four) hours.   levETIRacetam 1500 MG/100ML Soln Commonly known as:  KEPPRA Inject 100 mLs (1,500 mg total) into the vein 2 (two) times daily.   multivitamin Liqd Place 15 mLs into feeding tube daily.   ondansetron 4 MG tablet Commonly known as:  ZOFRAN Take 1 tablet (4 mg total) by mouth every 6 (six) hours as needed for nausea.    pravastatin 20 MG tablet Commonly known as:  PRAVACHOL Place 1 tablet (20 mg total) into feeding tube daily at 6 PM. Replaces:  lovastatin 20 MG tablet   sodium chloride 0.9 % infusion Inject 250 mLs into the vein as needed (for IV line care(Saline / Heparin Lock)).   sodium chloride flush 0.9 % Soln Commonly known as:  NS 10-40 mLs by Intracatheter route every 12 (twelve) hours.   sodium chloride flush 0.9 % Soln Commonly known as:  NS 10-40 mLs by Intracatheter route as needed (flush).   sodium chloride flush 0.9 % Soln Commonly known as:  NS Inject 3 mLs into the vein every 12 (twelve) hours.   sodium chloride flush 0.9 % Soln Commonly known as:  NS Inject 3 mLs into the vein as needed.   valproate 500 mg in dextrose 5 % 50 mL Inject 500 mg into the vein every 8 (eight) hours.      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 Saralyn Pilar 06/30/2018) Hx: Stroke, HTN, Hyperlipidemia, and Left ventricular hypertrophy) Continuous telemetry monitoring  Maintain map >65 Continue aspirin and pravachol   IV lasix as bp permits   Hypokalemia-revolved  Trend BMP  Replace electrolytes as indicated  Monitor UOP Avoid nephrotoxic medications   Anemia without obvious acute blood loss  VTE px: subq lovenox Trend CBC  Monitor for s/sx of bleeding and transfuse for hgb <7  UTI Trend WBC and monitor fever curve Will check PCT  Follow urine culture Will start cefazolin   Acute encephalopathy s/p PEA arrest concerning for possible anoxic injury Mechanical intubation discomfort/pain   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

## 2018-07-25 ENCOUNTER — Telehealth: Payer: Self-pay | Admitting: Family Medicine

## 2018-07-25 NOTE — Telephone Encounter (Signed)
Patient's son, Lanny Hurst, called.  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

## 2018-07-26 NOTE — Telephone Encounter (Signed)
Called and spoke with patient's son to get update on patient's condition. She was transferred to [place] and continues to be in a coma. They are discussing doing a trach and inserting a feeding tube.

## 2018-08-18 ENCOUNTER — Ambulatory Visit: Payer: Medicare Other | Admitting: Family

## 2018-08-23 MED ORDER — GENERIC EXTERNAL MEDICATION
1.00 | Status: DC
Start: 2018-09-09 — End: 2018-08-23

## 2018-08-23 MED ORDER — LEVETIRACETAM 1000 MG PO TABS
2000.00 | ORAL_TABLET | ORAL | Status: DC
Start: 2018-09-09 — End: 2018-08-23

## 2018-08-23 MED ORDER — VALPROIC ACID 250 MG/5ML PO SOLN
1000.00 | ORAL | Status: DC
Start: 2018-09-09 — End: 2018-08-23

## 2018-08-23 MED ORDER — CLOBAZAM 10 MG PO TABS
10.00 | ORAL_TABLET | ORAL | Status: DC
Start: 2018-09-09 — End: 2018-08-23

## 2018-08-23 MED ORDER — HYDRALAZINE HCL 20 MG/ML IJ SOLN
10.00 | INTRAMUSCULAR | Status: DC
Start: ? — End: 2018-08-23

## 2018-08-23 MED ORDER — POLYETHYLENE GLYCOL 3350 17 G PO PACK
17.00 | PACK | ORAL | Status: DC
Start: 2018-09-10 — End: 2018-08-23

## 2018-08-23 MED ORDER — CHLORHEXIDINE GLUCONATE 0.12 % MT SOLN
5.00 | OROMUCOSAL | Status: DC
Start: 2018-09-09 — End: 2018-08-23

## 2018-08-23 MED ORDER — MIDODRINE HCL 5 MG PO TABS
10.00 | ORAL_TABLET | ORAL | Status: DC
Start: 2018-09-09 — End: 2018-08-23

## 2018-08-23 MED ORDER — GLUCAGON HCL (DIAGNOSTIC) 1 MG IJ SOLR
1.00 | INTRAMUSCULAR | Status: DC
Start: ? — End: 2018-08-23

## 2018-08-23 MED ORDER — CARBOXYMETHYLCELLULOSE SOD PF 1 % OP GEL
1.00 | OPHTHALMIC | Status: DC
Start: 2018-09-09 — End: 2018-08-23

## 2018-08-23 MED ORDER — INSULIN LISPRO 100 UNIT/ML ~~LOC~~ SOLN
0.00 | SUBCUTANEOUS | Status: DC
Start: 2018-09-09 — End: 2018-08-23

## 2018-08-23 MED ORDER — SODIUM CHLORIDE 3 % IN NEBU
4.00 | INHALATION_SOLUTION | RESPIRATORY_TRACT | Status: DC
Start: ? — End: 2018-08-23

## 2018-08-23 MED ORDER — ENOXAPARIN SODIUM 40 MG/0.4ML ~~LOC~~ SOLN
40.00 | SUBCUTANEOUS | Status: DC
Start: 2018-08-23 — End: 2018-08-23

## 2018-08-23 MED ORDER — DEXTROSE 50 % IV SOLN
25.00 | INTRAVENOUS | Status: DC
Start: ? — End: 2018-08-23

## 2018-09-09 MED ORDER — ACETAMINOPHEN 325 MG PO TABS
650.00 | ORAL_TABLET | ORAL | Status: DC
Start: ? — End: 2018-09-09

## 2018-09-09 MED ORDER — LEVOFLOXACIN IN D5W 750 MG/150ML IV SOLN
750.00 | INTRAVENOUS | Status: DC
Start: 2018-09-09 — End: 2018-09-09

## 2018-09-09 MED ORDER — FUROSEMIDE 10 MG/ML PO SOLN
40.00 | ORAL | Status: DC
Start: 2018-09-10 — End: 2018-09-09

## 2018-09-09 MED ORDER — METOCLOPRAMIDE HCL 5 MG/5ML PO SOLN
5.00 | ORAL | Status: DC
Start: 2018-09-09 — End: 2018-09-09

## 2018-09-09 MED ORDER — POTASSIUM CHLORIDE 20 MEQ PO PACK
20.00 | PACK | ORAL | Status: DC
Start: 2018-09-10 — End: 2018-09-09

## 2018-12-09 ENCOUNTER — Telehealth: Payer: Self-pay

## 2018-12-09 NOTE — Telephone Encounter (Signed)
[place] Night - Client Nonclinical Telephone Record AccessNurse Client Real Night - Client Client Site [place] Physician Tor Netters- NP Contact Type Call Who Is Calling Patient / Member / Family / Caregiver Caller Name Tiffany Barber Caller Phone Number [phone] Patient Name Tiffany Barber Patient DOB 02/01/1959 Call Type Message Only Information Provided Reason for Call Request for General Office Information Initial Comment Caller would like NP [name] to call him ASAP. Declined nurse triage. Additional Comment Call Closed By: [name] Transaction Date/Time: 29-Dec-2018 5:01:31 PM (ET)

## 2018-12-09 NOTE — Telephone Encounter (Signed)
Called and spoke with patient's son. Expressed my condolences.

## 2018-12-09 NOTE — Telephone Encounter (Signed)
Tiffany Barber pts son said that pt passed on 12/12/18 and wanted Glenda Chroman FNP to be aware. Tiffany Barber request cb from Glenda Chroman FNP when she has time.

## 2018-12-18 DEATH — deceased

## 2020-05-11 IMAGING — CT CT HEAD WITHOUT CONTRAST
4 of 9 series · 15 of 47 positions shown, 17 images · non-contrast
Comparison: 09/28/2017

CLINICAL DATA: Status post resuscitation.

EXAM:
CT HEAD WITHOUT CONTRAST
TECHNIQUE: Contiguous axial images were obtained from the base of the skull
through the vertex without intravenous contrast.

[Series 4: head wo · axial · 0.41mm/px · z∈[-188,-88]mm · 5 of 30 slices shown, 7 images (1 of 2)]
[im 5/30  brain]
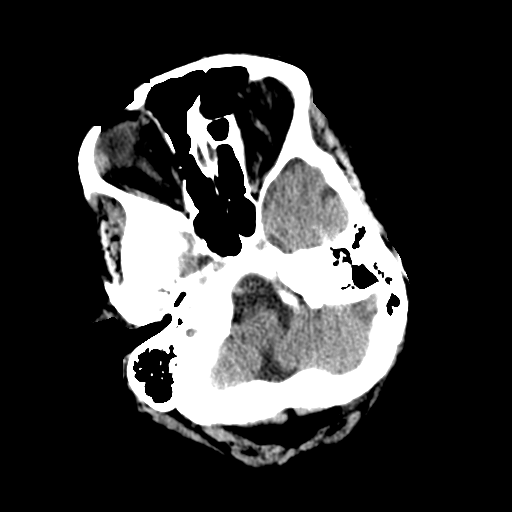
[im 5/30  bone]
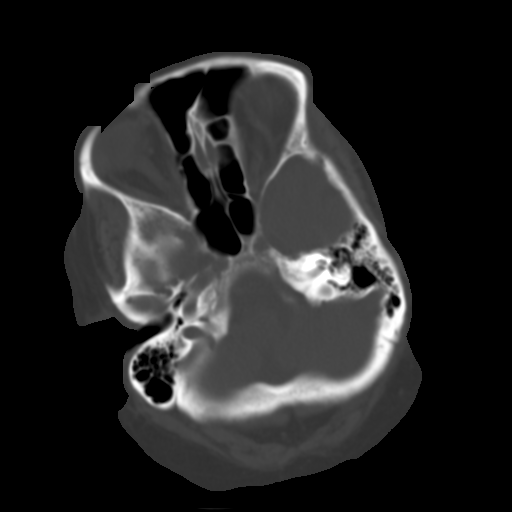
[im 10/30  brain]
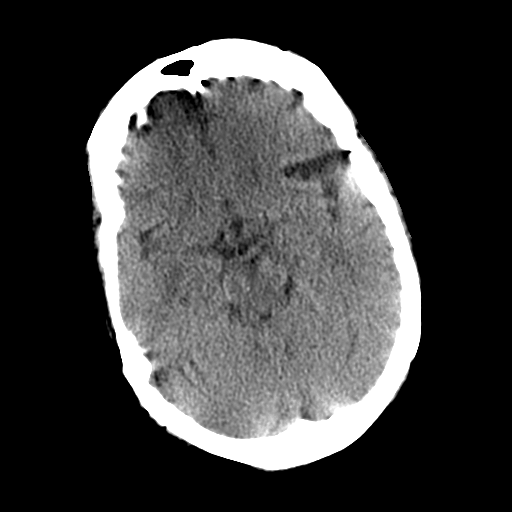
[im 15/30  brain]
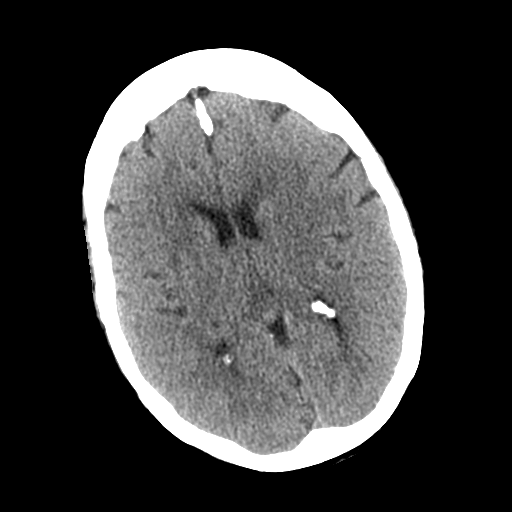
[im 20/30  brain]
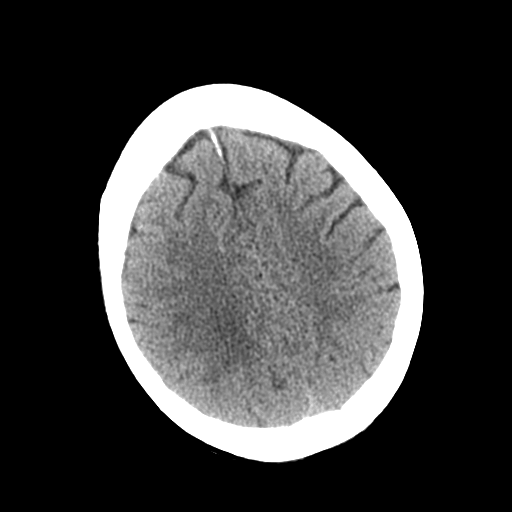
[im 25/30  brain]
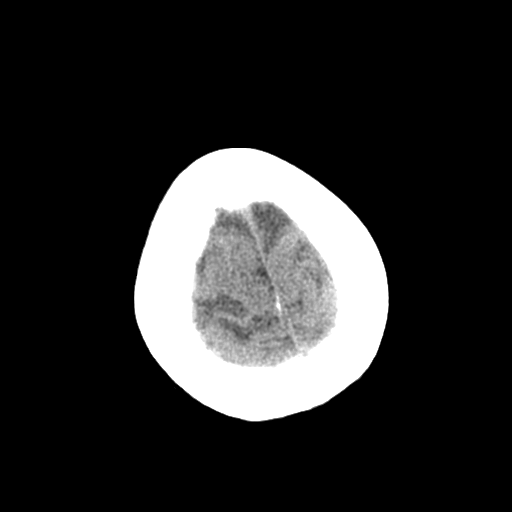
[im 25/30  bone]
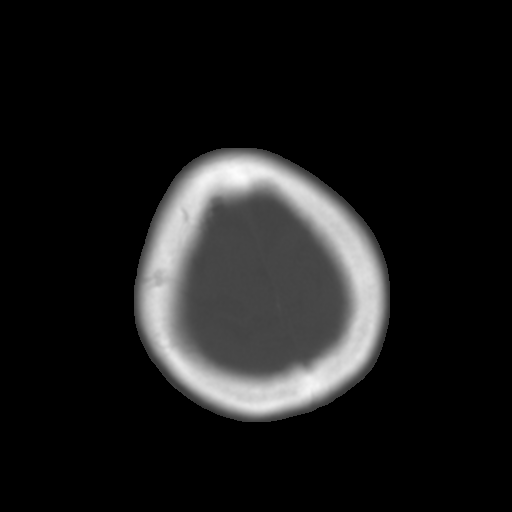

[Series 5: head wo · axial · 0.32mm/px · z∈[-203,-128]mm · 4 of 31 slices shown (2 of 2)]
[im 6/31  brain]
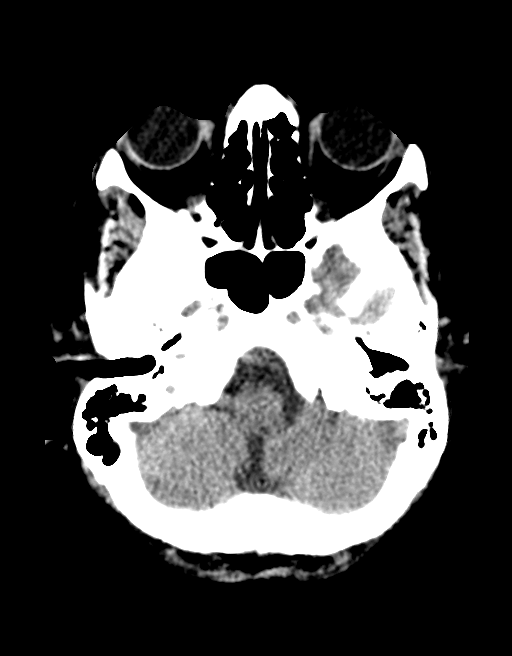
[im 11/31  brain]
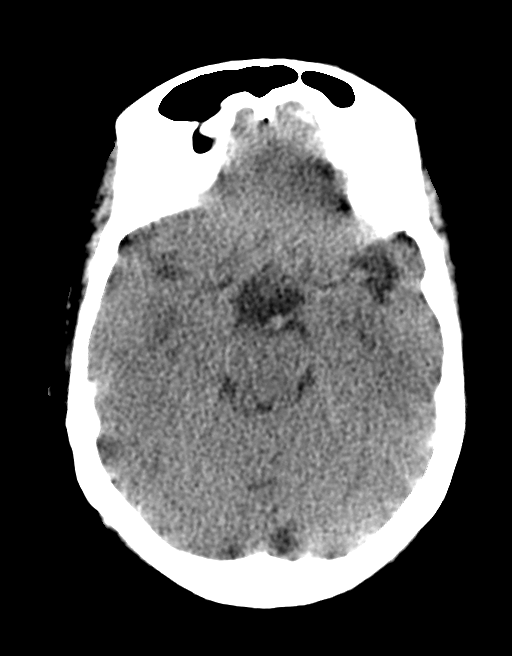
[im 16/31  brain]
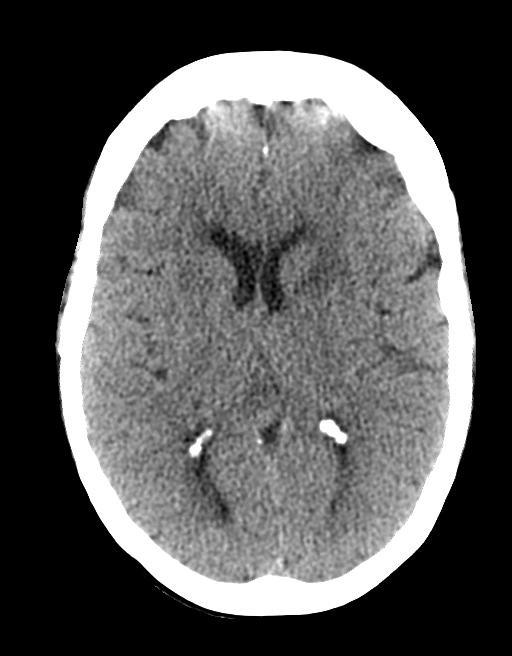
[im 21/31  brain]
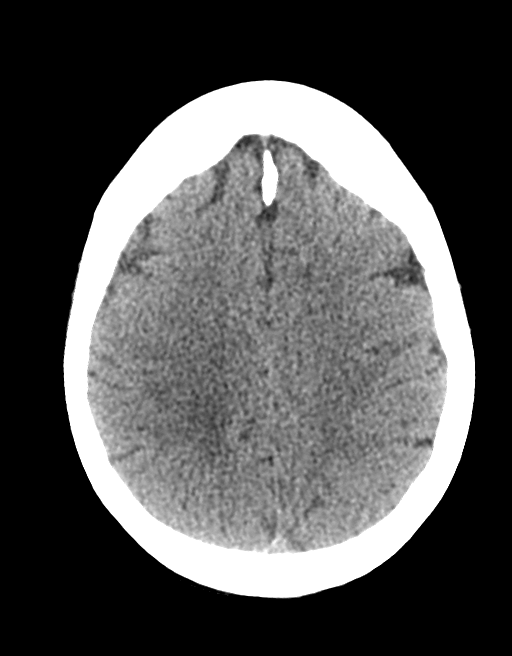

[Series 12: coronal soft tissue · coronal · 0.33mm/px · 3 of 64 slices shown]
[im 16/64  brain]
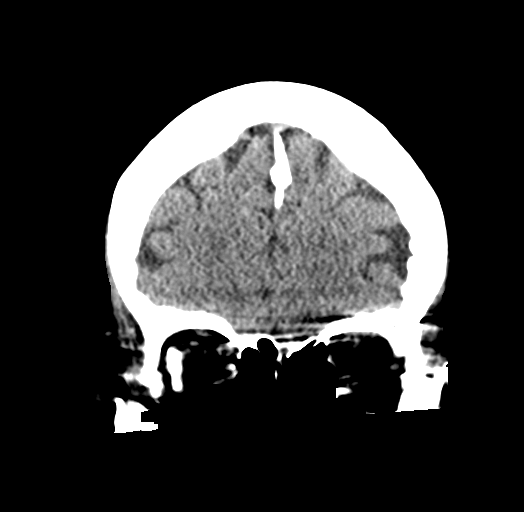
[im 32/64  brain]
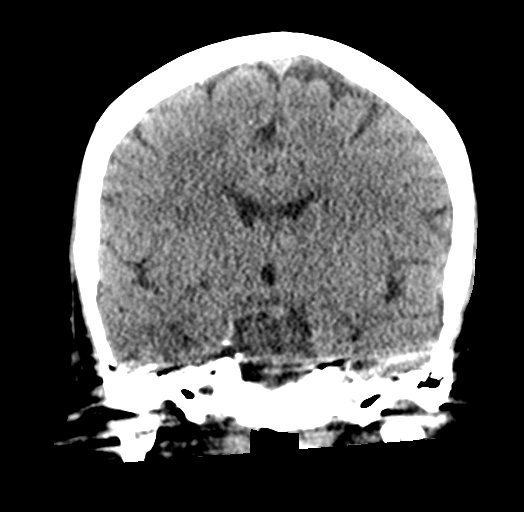
[im 48/64  brain]
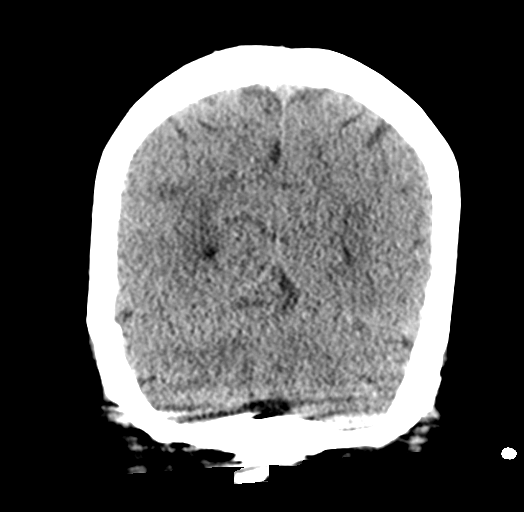

[Series 13: sagittal soft tissue · sagittal · 0.35mm/px · 3 of 49 slices shown]
[im 14/49  brain]
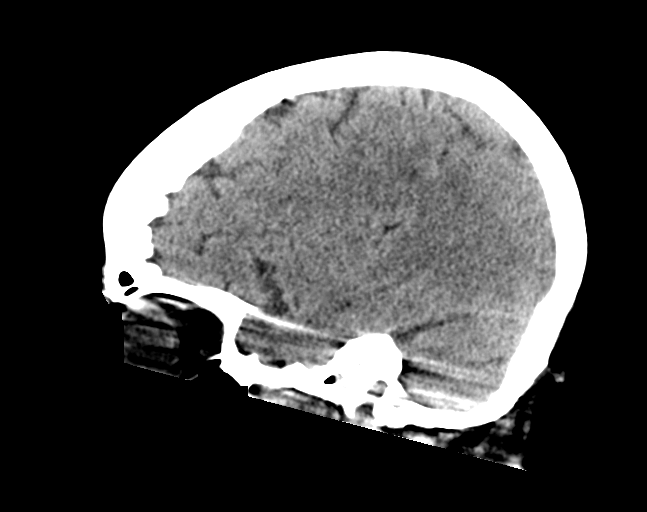
[im 25/49  brain]
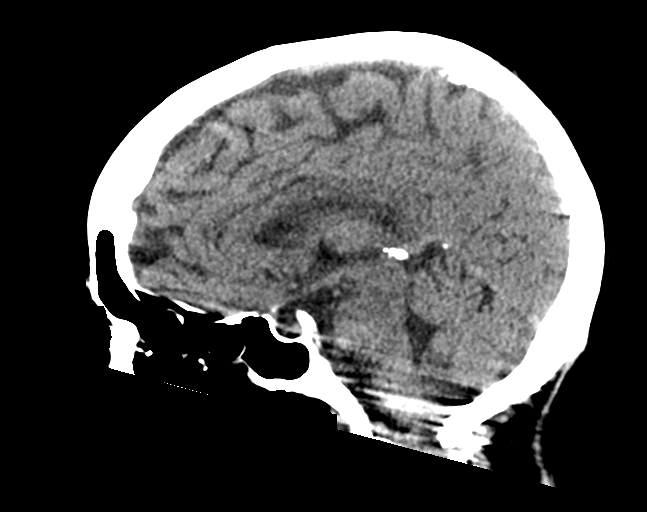
[im 37/49  brain]
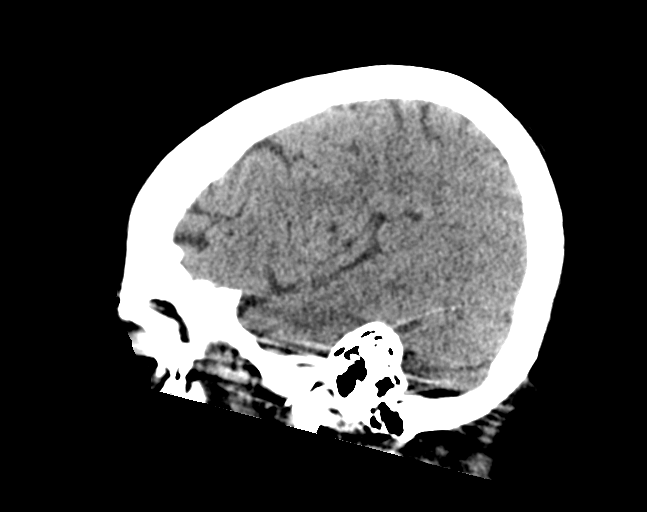

[15 of 47 positions shown; findings below may reference images not displayed]

FINDINGS: Examination is quite limited due to patient motion despite
rescanning attempts.

Brain: The ventricles are in the midline without mass effect or
shift. No extra-axial fluid collections are identified. Stable
periventricular white matter disease and remote left sided basal
ganglia infarct. No findings for acute hemispheric infarction or
intracranial hemorrhage. No mass lesions. The brainstem and
cerebellum are grossly normal.

Vascular: Stable vascular calcifications. No definite aneurysm or
hyperdense vessels.

Skull: No skull fracture or bone lesion. Mild hyperostosis frontalis
interna.

Sinuses/Orbits: The paranasal sinuses and mastoid air cells are
grossly clear. The globes are intact.

Other: No scalp hematoma or mass.
IMPRESSION: 1. Limited examination.
2. No definite CT findings for acute intracranial process or skull
fracture.
3. Chronic age related periventricular white matter disease and
remote left basal ganglia infarct.

## 2020-05-12 IMAGING — DX PORTABLE CHEST - 1 VIEW
1 series · 1 of 1 positions shown · non-contrast
Comparison: 07/14/2018.

CLINICAL DATA: Respiratory failure.

EXAM:
PORTABLE CHEST 1 VIEW

[chest ap]
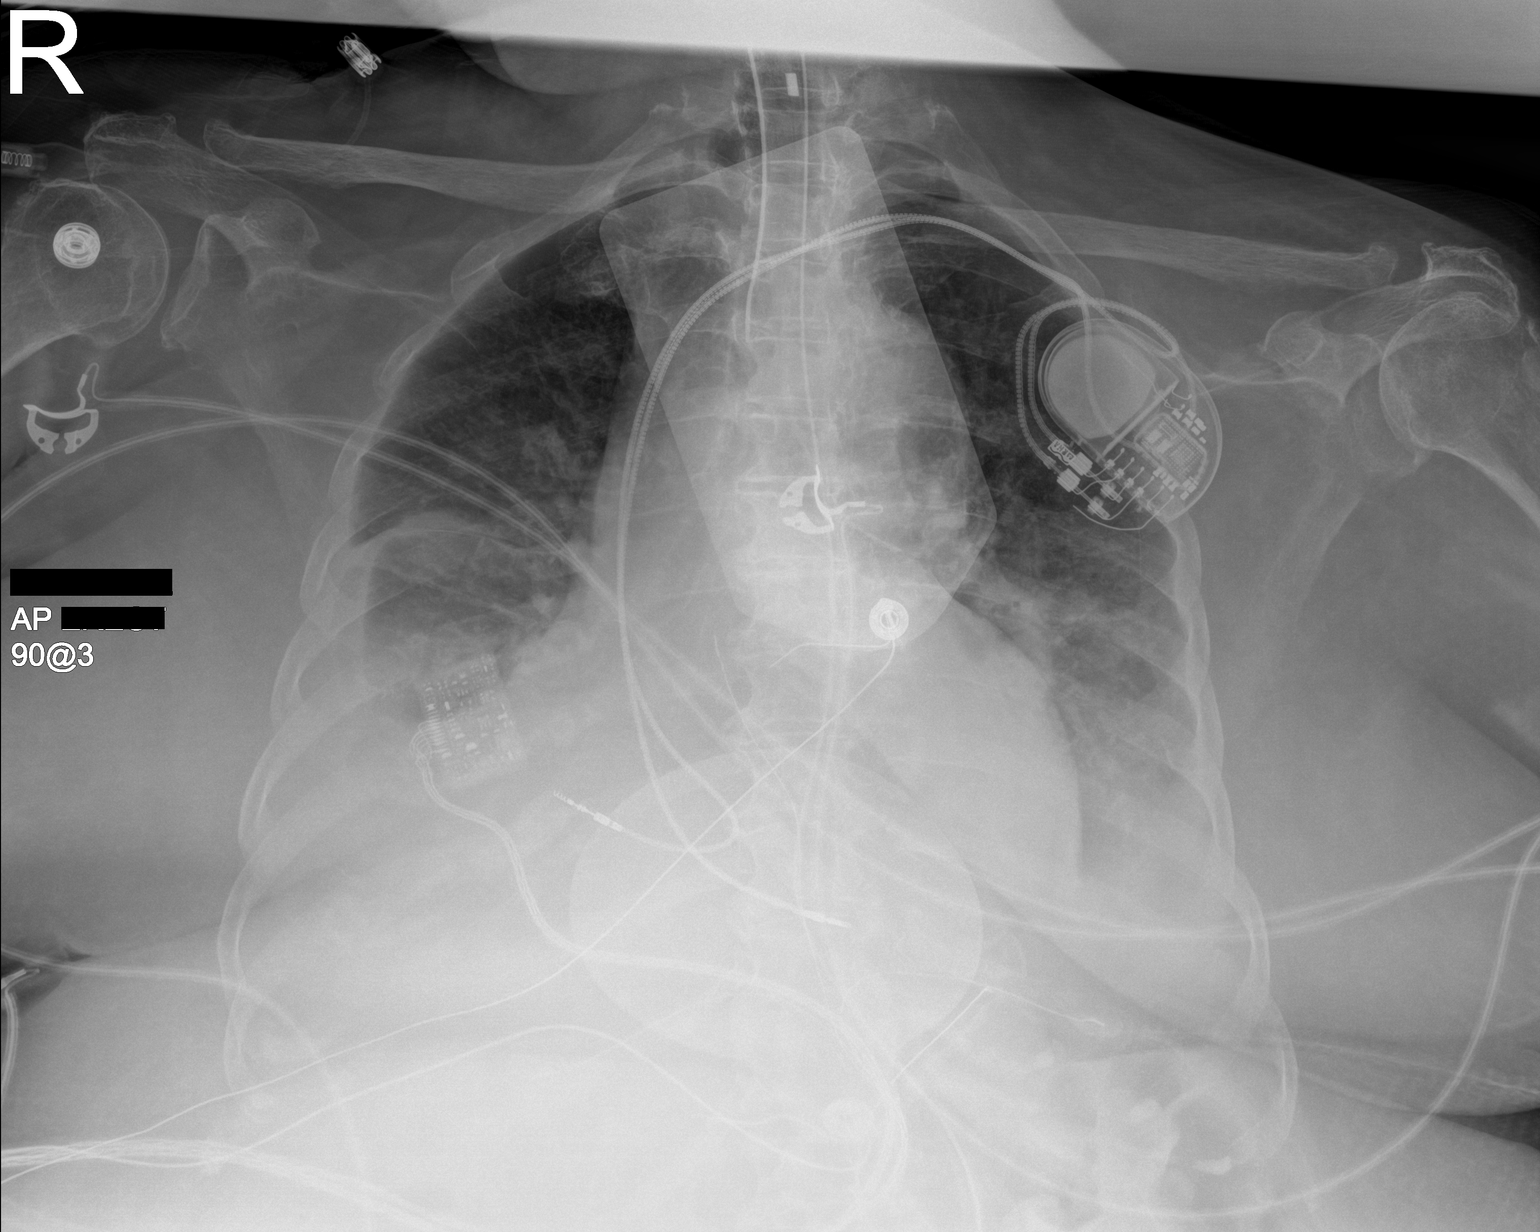

[1 of 1 positions shown; findings below may reference images not displayed]

FINDINGS: Endotracheal tube and NG tube in stable position. Cardiac pacer
stable position. Heart size stable. Bibasilar pulmonary
infiltrates/edema. Bibasilar atelectasis. Bilateral pleural
effusions. No pneumothorax.
IMPRESSION: 1.  Lines and tubes stable position.

2.  Cardiac pacer stable position.

3. Cardiomegaly with bibasilar pulmonary infiltrates/edema and
bilateral pleural effusions suggesting CHF. Bibasilar pneumonia
cannot be excluded.

4.  Bibasilar atelectasis.  Chest appears similar to prior exam.

## 2020-05-13 IMAGING — US VENOUS DOPPLER ULTRASOUND OF  LOWER EXTREMITIES
1 series · 14 of 24 positions shown · non-contrast
Comparison: None.

CLINICAL DATA: Lower extremity edema

EXAM:
BILATERAL LOWER EXTREMITY VENOUS DUPLEX ULTRASOUND
TECHNIQUE: Doppler venous assessment of the bilateral lower extremity deep
venous system was performed, including characterization of spectral
flow, compressibility, and phasicity.

[Series 1: venous doppler ultrasound of lower extremities · 14 of 60 slices shown]
[im 1/60]
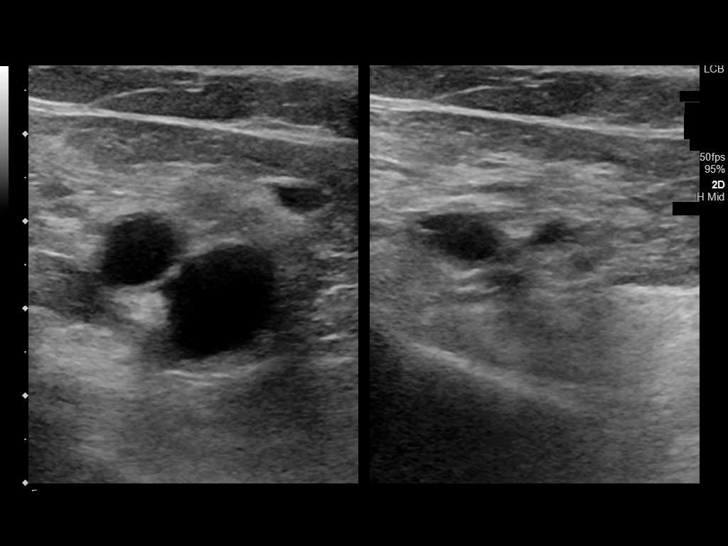
[im 6/60]
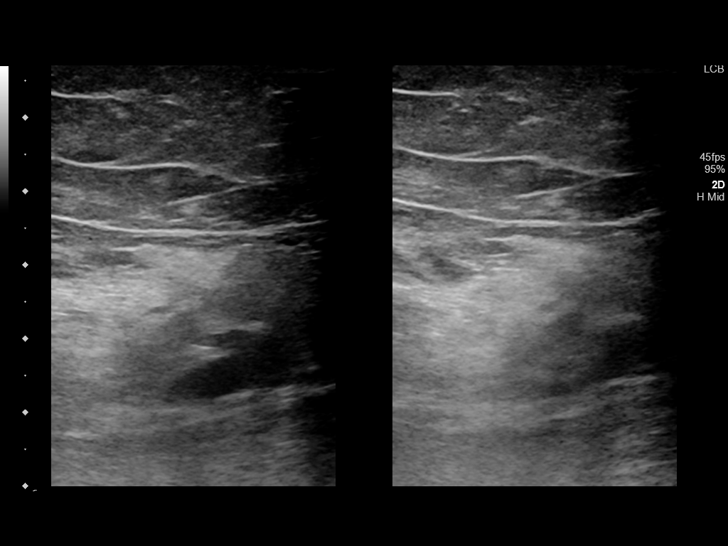
[im 11/60]
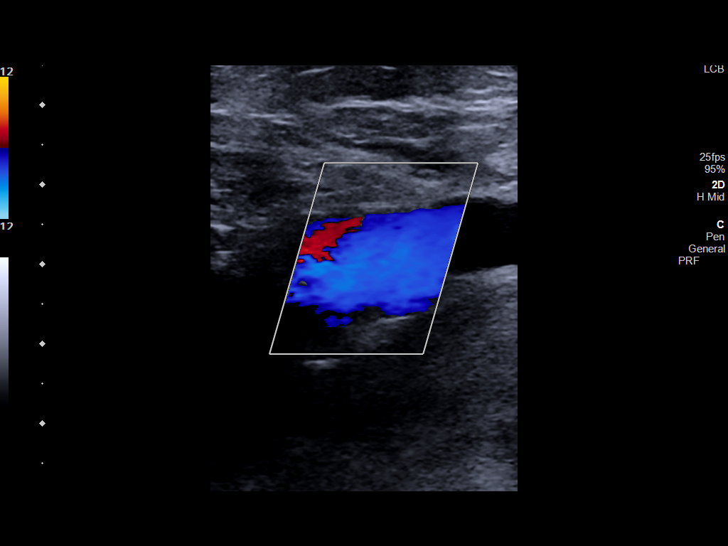
[im 16/60]
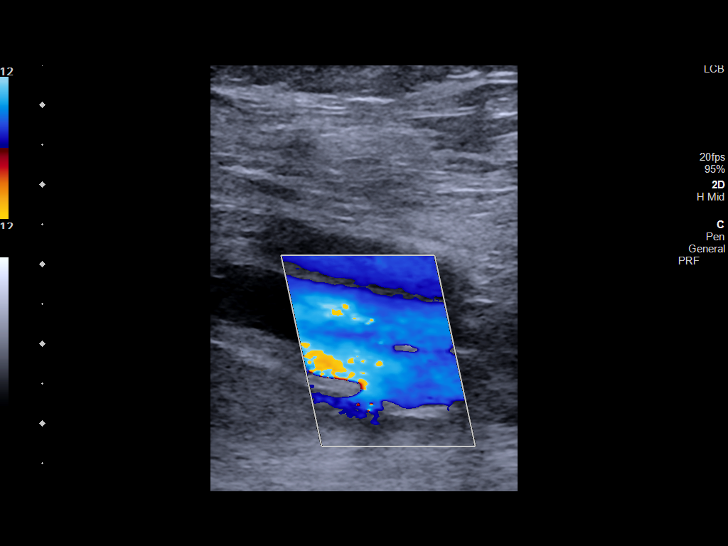
[im 18/60]
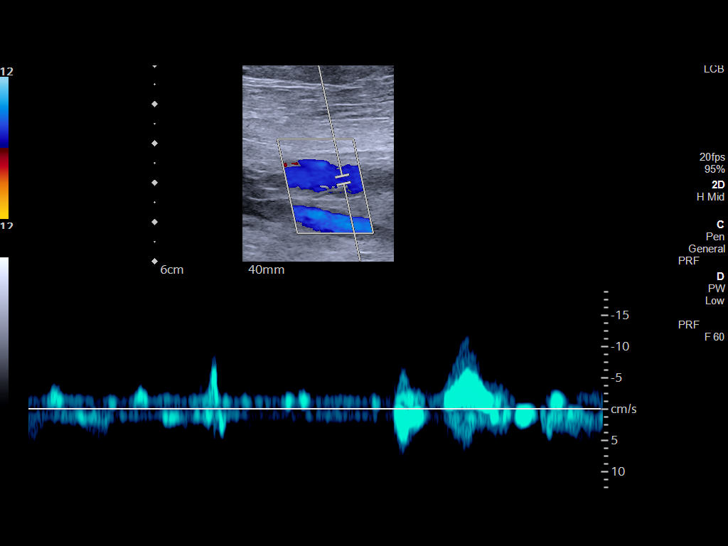
[im 24/60]
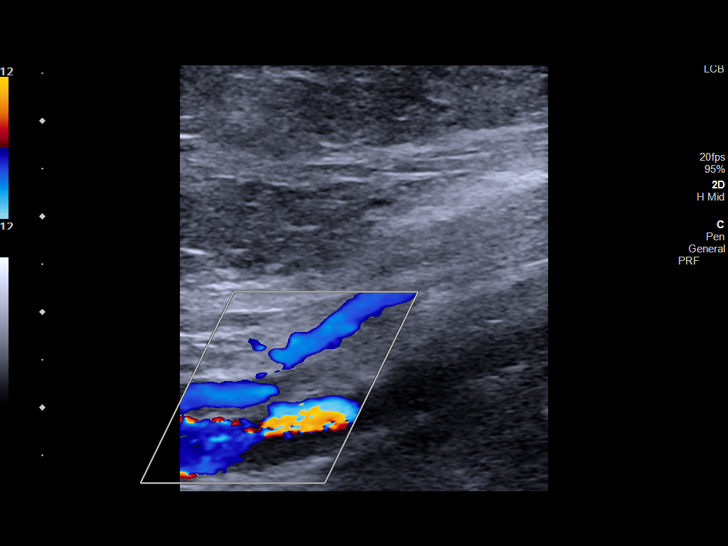
[im 29/60]
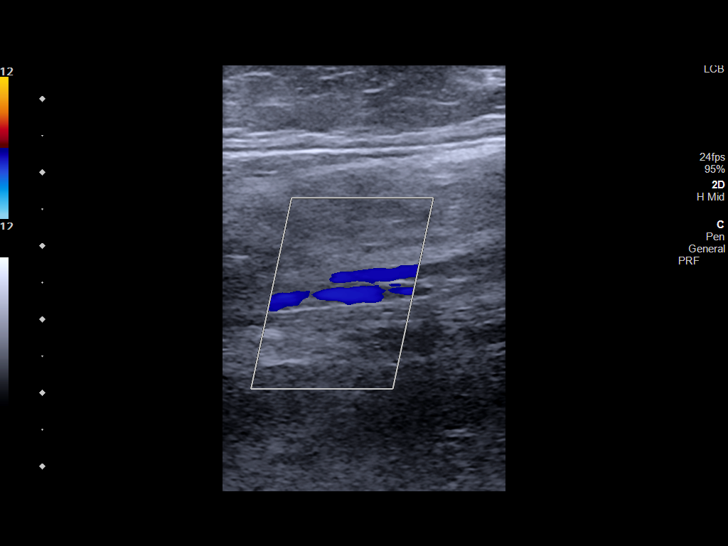
[im 31/60]
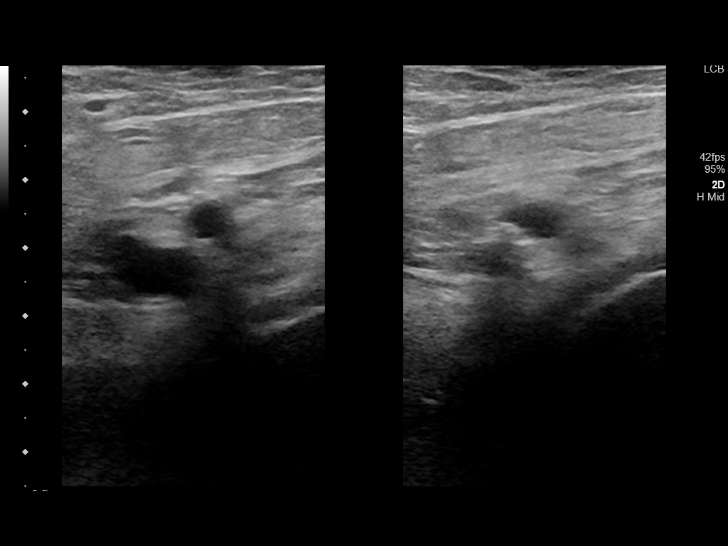
[im 36/60]
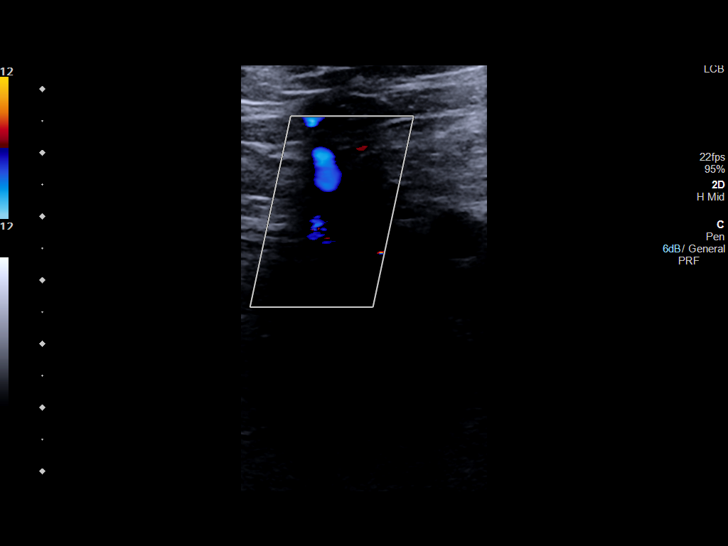
[im 42/60]
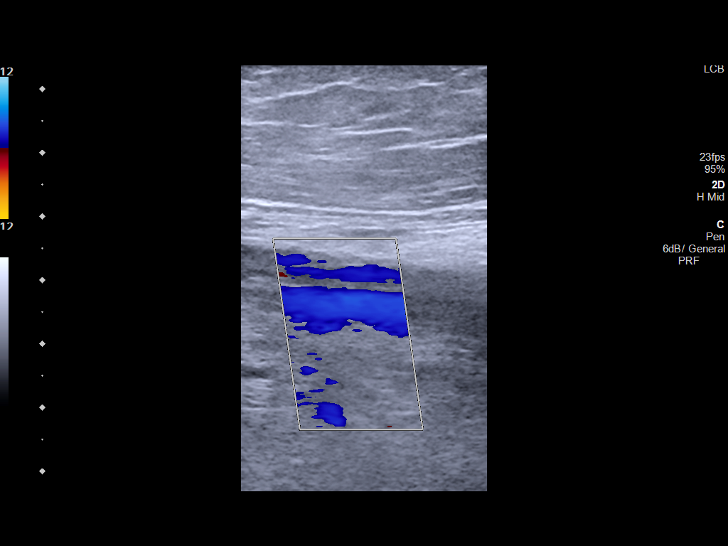
[im 47/60]
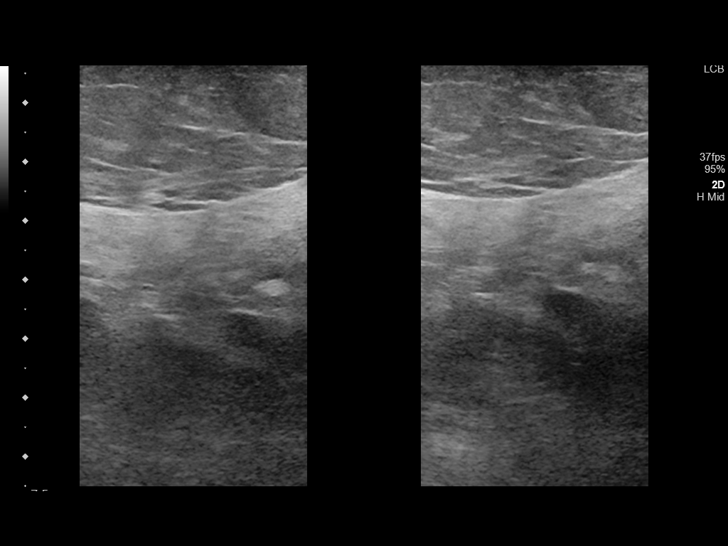
[im 49/60]
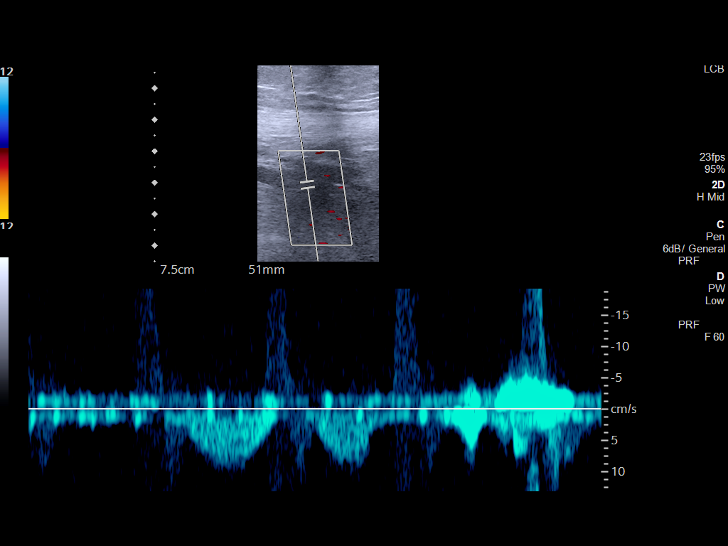
[im 54/60]
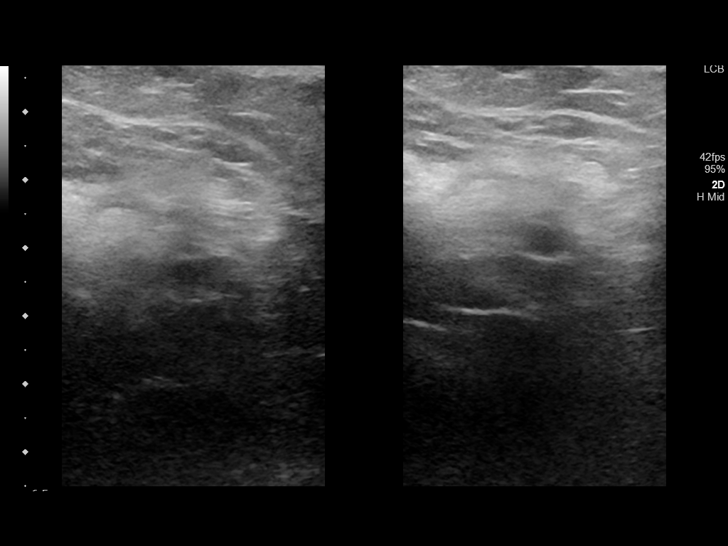
[im 60/60]
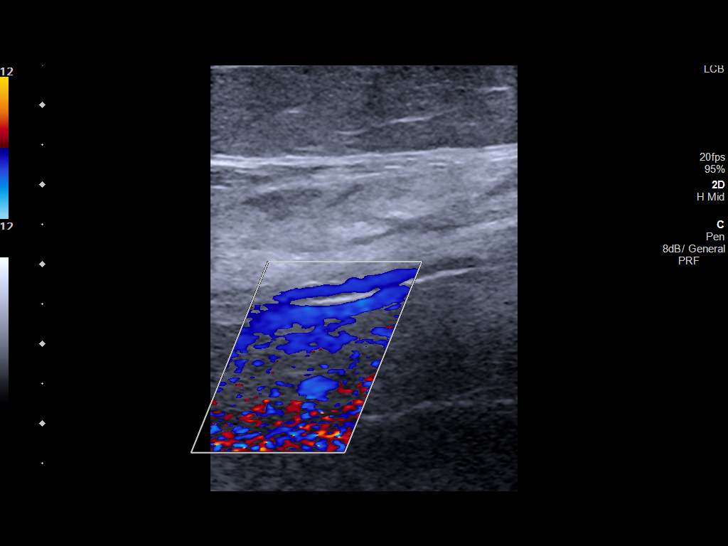

[14 of 24 positions shown; findings below may reference images not displayed]

FINDINGS: There is complete compressibility of the common femoral, femoral,
and popliteal veins. Doppler analysis demonstrates respiratory
phasicity and augmentation of flow with calf compression. No obvious
superficial vein or calf vein thrombosis.
IMPRESSION: No evidence of lower extremity DVT.
